# Patient Record
Sex: Female | Born: 1990 | Race: Black or African American | Hispanic: No | Marital: Single | Smoking: Never smoker
Health system: Southern US, Community
[De-identification: ages and names within clinical notes are randomized; demographics above are authoritative.]

## PROBLEM LIST (undated history)

## (undated) DIAGNOSIS — R197 Diarrhea, unspecified: Secondary | ICD-10-CM

## (undated) DIAGNOSIS — Z8719 Personal history of other diseases of the digestive system: Secondary | ICD-10-CM

## (undated) DIAGNOSIS — Z87442 Personal history of urinary calculi: Secondary | ICD-10-CM

## (undated) DIAGNOSIS — T7840XA Allergy, unspecified, initial encounter: Secondary | ICD-10-CM

## (undated) DIAGNOSIS — M810 Age-related osteoporosis without current pathological fracture: Secondary | ICD-10-CM

## (undated) DIAGNOSIS — K5080 Crohn's disease of both small and large intestine without complications: Secondary | ICD-10-CM

## (undated) DIAGNOSIS — K5090 Crohn's disease, unspecified, without complications: Secondary | ICD-10-CM

## (undated) HISTORY — PX: INCISION AND DRAINAGE PERIRECTAL ABSCESS: SHX1804

## (undated) HISTORY — DX: Age-related osteoporosis without current pathological fracture: M81.0

## (undated) HISTORY — DX: Allergy, unspecified, initial encounter: T78.40XA

## (undated) HISTORY — DX: Diarrhea, unspecified: R19.7

## (undated) HISTORY — DX: Crohn's disease, unspecified, without complications: K50.90

---
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
## 2011-01-03 ENCOUNTER — Encounter: Payer: Self-pay | Admitting: Urgent Care

## 2011-01-03 ENCOUNTER — Ambulatory Visit (INDEPENDENT_AMBULATORY_CARE_PROVIDER_SITE_OTHER): Payer: Medicaid Other | Admitting: Urgent Care

## 2011-01-03 DIAGNOSIS — K5090 Crohn's disease, unspecified, without complications: Secondary | ICD-10-CM

## 2011-01-05 NOTE — Progress Notes (Signed)
Summary: problems with prednisone  Phone Note Call from Patient Call back at Home Phone [phone]   Caller: Mom Summary of Call: pts mother called- pt has been on prednisone 22m x 1week. she called her mother today and told her that her legs got weak again today and she fell. pt has 1 more week on 348m pts has not had any diarrhea since being on 3041mplease advise.  Initial call taken by: JulBurnadette PeterN,  February  1, [id number] PM     Appended Document: problems with prednisone decrease to 25 mg by mouth X 1 week, continue taper. if issue continues, we need to think about entocort. unsure if this is an insurance issue or not. have pt give PR on Monday.   Appended Document: problems with prednisone pts mother aware, pt has ov on Monday 01/03/11 with KJ

## 2011-01-11 ENCOUNTER — Encounter: Payer: Self-pay | Admitting: Urgent Care

## 2011-01-12 ENCOUNTER — Encounter: Payer: Self-pay | Admitting: Gastroenterology

## 2011-01-13 NOTE — Assessment & Plan Note (Addendum)
Summary: fu ov in 2 months with KJ or AS per SF/ss   Vital Signs:  Patient profile:   20 year old female Height:      64 inches Weight:      131 pounds BMI:     22.57 Temp:     98.3 degrees F oral Pulse rate:   104 / minute BP sitting:   114 / 80  (left arm) Cuff size:   regular  Vitals Entered By: Burnadette Peter LPN (January 03, 9701 3:18 PM)  Visit Type:  Follow-up Visit Primary Care Provider:  Dr. Lanny Cramp ([facility])  Chief Complaint:  follow up.  History of Present Illness: Here for FU abd pain w/ hx Crohn's disease & recent flare.  Recently started on prednisone & had a fall & weakness in both legs.  Denies fever.  Overall feels well.  Denies vomiting but did have nausea.  Has gained 16# in past 6 wks.  On Prednisone taper 33m daily now.  On Imuran 569mdaily.  OTC MIralax 17 grams daily for constipation.  BM's two times a day, small amts red blood in commode, now resolved.  Good appetite.  Increase urinary freq since on prednisone.  Denies abd pain at this time.  Last labs & CT abd/pelvis w/ IV contrast: mild TI wall thickening.  CBC showed ANC 9100, LFTS normal, hcg normal.  Hx perianal fistula.  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 Once Daily 2)  Imuran 50 Mg Tabs (Azathioprine) ...Marland Kitchen 1 By Mouth Daily 3)  Prednisone 20 Mg Tabs (Prednisone) .... 2 By Mouth Daily For 2 Weeks Then 1 By Mouth Daily For 6 Weeks 4)  Depo-Subq Provera 104 104 Mg/0.6559musp (Medroxyprogesterone Acetate) .... Q 3 Months 5)  Prednisone 10 Mg Tabs (Prednisone) .... 2 By Mouth Daily X 1 Wk, Then 1 1/2 By Mouth Daily X 1 Wk, Then 1 By Mouth Daily X 1 Wk, Then 1/2 By Mouth Daily X 1 Wk, Then Stop 6)  Entocort Ec 3 Mg Xr24h-Cap  (Budesonide) .... 3 By Mouth Daily For Crohn's Disease  Allergies (verified): 1)  ! Morphine  Past History:  Past Medical History: Last updated: 12/21/2010 ANAL FISTUALA Crohns (dec 2011)  Past Surgical History: Last updated: 11/09/2010 I&D  Review of Systems      See HPI General:  Complains of weakness; denies fever, chills, sweats, anorexia, fatigue, malaise, and weight loss. CV:  Complains of peripheral edema; denies chest pains, angina, palpitations, syncope, dyspnea on exertion, orthopnea, PND, and claudication; mild in AM bilat ankles since pred. Resp:  Denies dyspnea at rest, dyspnea with exercise, cough, sputum, wheezing, coughing up blood, and pleurisy. GI:  Denies difficulty swallowing, pain on swallowing, jaundice, black BMs, and fecal incontinence. GU:  Complains of urinary frequency and amenorrhea; denies urinary burning, blood in urine, nocturnal urination, urinary incontinence, and abnormal vaginal bleeding; on Depo LMP irreg. MS:  Complains of joint pain / LOM; denies joint swelling, joint stiffness, joint deformity, low back pain, muscle weakness, muscle cramps, muscle atrophy, leg pain at night, leg pain with exertion, and shoulder pain / LOM hand / wrist pain (CTS). Derm:  Denies rash, itching, dry skin, hives, moles, warts, and unhealing ulcers. Psych:  Denies depression, anxiety, memory loss, suicidal ideation, hallucinations, paranoia, phobia, and confusion. Heme:  Denies bruising and enlarged lymph nodes.  Physical Exam  General:  Well developed, well nourished, no acute distress. Head:  Normocephalic and atraumatic. Eyes:  Sclera clear, no icterus. Mouth:  No deformity or lesions, dentition normal. Neck:  Supple; no masses. Heart:  Regular rate and rhythm; no murmurs, rubs,  or bruits. Abdomen:  Soft, nontender and nondistended. No masses, hepatosplenomegaly or hernias noted. Normal bowel sounds.without guarding and without rebound.   Msk:  Symmetrical  with no gross deformities. Normal posture. Extremities:  No edema or deformities noted. Neurologic:  Alert and  oriented x4;  grossly normal neurologically. Skin:  No pretibial lesions. Psych:  Alert and cooperative. Normal mood and affect.   Impression & Recommendations:  Problem # 1:  ABDOMINAL PAIN (ICD-10.52) 20 year old female with hx perianal fistula and recent Crohn's flare on prednison.  Having lots of side effects from prednisone, perceived weakness, significant wt gain, lower extremity edema.  Denies abd pain or diarrhea at this time.  Discussed pred taper w/ Dr Gala Romney Begin entocort 67m daily Taper Pred 280mdaily x 1 wk 1545m 1 wk 68m28m1wk 5mg 72mwk then stop FU [facility] thipurine metabolites, may need increase in Imuran Discussed Crohn's/Colitis foundation & gave HO  OV in 3 weeks w/me or call if any problems  Other Orders: Est. Patient Level III (9921(87681scriptions: ENTOCORT EC 3 MG XR24H-CAP (BUDESONIDE) 3 by mouth daily for Crohn's disease  #31 x 0   Entered and Authorized by:   KandiAndria MeuseBC   Signed by:   KandiAndria MeuseBC on 01/03/2011   Method used:   [facility]ron[facility] to        [facility]       [address] [id number]  Ph: [phone]  Fax: [phone]:   [id number] 10 MG TABS (PREDNISONE) 2 by mouth daily x 1 wk, then 1 1/2 by mouth daily x 1 wk, then 1 by mouth daily x 1 wk, then 1/2 by mouth daily x 1 wk, then stop  #40 x 0   Entered and Authorized by:   KandiAndria MeuseBC   Signed by:   KandiAndria MeuseBC on 01/03/2011   Method used:   [facility] to        [facility]       [address] [id number]  Ph: [phone]  Fax: [phone]:   [id number] Added: 1)  Est. Patient Level III [9921[82707]ended Document: fu ov in 2  months with KJ or AS per SF/ss 3 WK F/U OPV IS IN THE COMPUTER  Appended Document: fu ov in 2 months with KJ or AS per SF/ss Please call lab re:thEM:[email or web address]? Thanks  Appended Document: fu ov in 2 months with KJ or AS per SF/ss [facility], they have no record of any bloodwork in January, called [facility] and the last bloodwork she had done there was on January 18th. Spoke with pts mom and she stated pt  did go have it done but she will make her do it again since we have no record of it. Explained the reason for the bloodwork and she asked that I send it to the [facility]. lab order faxed to [facility].   Appended Document: fu ov in 2 months with KJ or AS per SF/ss Spoke w/ [facility]-they do not do [facility] labs\ Please FAX order to [facility] for thiopurine metabolites ASAP Thanks  Appended Document: fu ov in 2 months with KJ or AS per SF/ss lab order faxed to lab

## 2011-01-14 ENCOUNTER — Encounter: Payer: Self-pay | Admitting: Urgent Care

## 2011-01-19 NOTE — Medication Information (Signed)
Summary: Visual merchandiser   Imported By: Zeb Comfort 01/14/2011 11:27:28  _____________________________________________________________________  External Attachment:    Type:   Image     Comment:   External Document  Appended Document: RX Folder    Prescriptions: ENTOCORT EC 3 MG XR24H-CAP (BUDESONIDE) 3 by mouth daily for Crohn's disease  #90 x 0   Entered and Authorized by:   Andria Meuse FNP-BC   Signed by:   Andria Meuse FNP-BC on 01/15/2011   Method used:   Electronically to        [facility] [place] (retail)       [address]       [place], [place]  [address]       Ph: [phone]       Fax: [phone]   RxID:   [id number]

## 2011-01-21 ENCOUNTER — Encounter: Payer: Self-pay | Admitting: Gastroenterology

## 2011-01-25 ENCOUNTER — Ambulatory Visit (INDEPENDENT_AMBULATORY_CARE_PROVIDER_SITE_OTHER): Payer: Medicaid Other | Admitting: Gastroenterology

## 2011-01-25 ENCOUNTER — Encounter: Payer: Self-pay | Admitting: Gastroenterology

## 2011-01-25 ENCOUNTER — Ambulatory Visit: Payer: Medicaid Other | Admitting: Urgent Care

## 2011-01-25 DIAGNOSIS — K5090 Crohn's disease, unspecified, without complications: Secondary | ICD-10-CM

## 2011-01-25 LAB — CONVERTED CEMR LAB
AST: 20 units/L (ref 0–37)
Albumin: 3.8 g/dL (ref 3.5–5.2)
CO2: 28 meq/L (ref 19–32)
Eosinophils Relative: 0 % (ref 0–5)
Glucose, Bld: 109 mg/dL — ABNORMAL HIGH (ref 70–99)
Hemoglobin: 13.6 g/dL (ref 12.0–15.0)
Lymphs Abs: 0.8 10*3/uL (ref 0.7–4.0)
MCHC: 33.5 g/dL (ref 30.0–36.0)
Neutrophils Relative %: 87 % — ABNORMAL HIGH (ref 43–77)
Potassium: 3.9 meq/L (ref 3.5–5.3)
RDW: 14.6 % (ref 11.5–15.5)
Sodium: 138 meq/L (ref 135–145)
Total Bilirubin: 0.5 mg/dL (ref 0.3–1.2)
Total Protein: 7.2 g/dL (ref 6.0–8.3)

## 2011-01-25 NOTE — Letter (Signed)
Summary: DISABILITY DETERMINATION  DISABILITY DETERMINATION   Imported By: Hoy Morn 01/21/2011 11:43:16  _____________________________________________________________________  External Attachment:    Type:   Image     Comment:   External Document

## 2011-01-27 ENCOUNTER — Encounter: Payer: Self-pay | Admitting: Internal Medicine

## 2011-02-03 NOTE — Assessment & Plan Note (Signed)
Summary: fu ov in 3 1/2 weeks with KJ   Vital Signs:  Patient profile:   20 year old female Height:      64 inches Weight:      135 pounds BMI:     23.26 Temp:     98.6 degrees F oral Pulse rate:   104 / minute BP sitting:   116 / 72  (left arm)  Vitals Entered By: Loney Loh LPN (January 25, 4286 3:26 PM)  Visit Type:  Follow-up Visit Primary Care Provider:  Dr. Lanny Cramp [facility])   History of Present Illness: Kendra Franklin returns in f/u for recent flare of Crohn's. had been on prednisone but with troublesome side effects; now tapering off and transitioning to entocort. reports significant improvement. denies abdominal pain. 1 normal BM daily. no bleeding. Afebrile. No N/V. finished basketball season. feels great. recently had thiopurine metabolites drawn to assess possibly need to increase imuran. results 170, "lower likelihood of response". reference range of 230-400.  Current Medications (verified): 1)  Miralax  Powd (Polyethylene Glycol 3350) .... Once Daily 2)  Imuran 50 Mg Tabs (Azathioprine) .Marland Kitchen.. 1 By Mouth Daily 3)  Prednisone 20 Mg Tabs (Prednisone) .... 2 By Mouth Daily For 2 Weeks Then 1 By Mouth Daily For 6 Weeks 4)  Depo-Subq Provera 104 104 Mg/0.20m Susp (Medroxyprogesterone Acetate) .... Q 3 Months 5)  Prednisone 10 Mg Tabs (Prednisone) .... 2 By Mouth Daily X 1 Wk, Then 1 1/2 By Mouth Daily X 1 Wk, Then 1 By Mouth Daily X 1 Wk, Then 1/2 By Mouth Daily X 1 Wk, Then Stop 6)  Entocort Ec 3 Mg Xr24h-Cap (Budesonide) .... 3 By Mouth Daily For Crohn's Disease 7)  Budesonide 3 Mg Xr24h-Cap (Budesonide) .... Take 3 Once Daily  Allergies: 1)  ! Morphine  Past History:  Past Medical History: Last updated: 12/21/2010 ANAL FISTUALA Crohns (dec 2011)  Review of Systems General:  Denies fever, chills, and anorexia. Eyes:  Denies blurring, irritation, and discharge. ENT:  Denies sore throat, hoarseness, and difficulty swallowing. CV:  Denies chest pains and  syncope. Resp:  Denies dyspnea at rest and wheezing. GI:  See HPI. GU:  Denies urinary burning and urinary frequency. Kendra:  Denies joint pain / LOM, joint swelling, and joint stiffness. Derm:  Denies rash, itching, and dry skin. Neuro:  Denies weakness and syncope. Psych:  Denies depression and anxiety.  Physical Exam  General:  Well developed, well nourished, no acute distress. Abdomen:  +BS, soft, non-tender, non-distended, no HSM, no rebound or guarding.  Msk:  Symmetrical with no gross deformities. Normal posture. Neurologic:  Alert and  oriented x4;  grossly normal neurologically. Skin:  Intact without significant lesions or rashes. Psych:  Alert and cooperative. Normal mood and affect.   Impression & Recommendations:  Problem # 1:  CROHN'S DISEASE (ICD-554956  20year old female with resolving flare of Crohns. tapering off prednisone, entocort began first week in february. imuran 50 mg daily. thiopurine metabolites slightly below reference range. consider increasing dosage slightly. Will d/w Dr. RGala Romneyprior to medication adjustments.   Return in 4 weeks for reassessment.  needs CBC, CMP at next visit d/w dr. rGala Romneypotentially increasing imuran or maintaining current dose.   Orders: Est. Patient Level II ((68115   Orders Added: 1)  Est. Patient Level II [[72620] Appended Document: fu ov in 3 1/2 weeks with KJ 4 WK F/U OPV IS IN THE COMPUTER

## 2011-02-11 ENCOUNTER — Encounter: Payer: Self-pay | Admitting: Gastroenterology

## 2011-02-21 ENCOUNTER — Ambulatory Visit (INDEPENDENT_AMBULATORY_CARE_PROVIDER_SITE_OTHER): Payer: Medicaid Other | Admitting: Gastroenterology

## 2011-02-21 ENCOUNTER — Encounter: Payer: Self-pay | Admitting: Gastroenterology

## 2011-02-21 VITALS — BP 116/74 | HR 112 | Temp 97.5°F | Ht 64.0 in | Wt 133.0 lb

## 2011-02-21 DIAGNOSIS — K5090 Crohn's disease, unspecified, without complications: Secondary | ICD-10-CM

## 2011-02-22 ENCOUNTER — Encounter: Payer: Self-pay | Admitting: Gastroenterology

## 2011-02-22 LAB — COMPREHENSIVE METABOLIC PANEL
ALT: 10 U/L (ref 0–35)
AST: 18 U/L (ref 0–37)
Calcium: 9.6 mg/dL (ref 8.4–10.5)
Creat: 0.76 mg/dL (ref 0.40–1.20)
Glucose, Bld: 93 mg/dL (ref 70–99)
Potassium: 4 mEq/L (ref 3.5–5.3)

## 2011-02-22 LAB — CBC WITH DIFFERENTIAL/PLATELET
Eosinophils Absolute: 0.1 10*3/uL (ref 0.0–0.7)
Eosinophils Relative: 1 % (ref 0–5)
Hemoglobin: 13.8 g/dL (ref 12.0–15.0)
Lymphocytes Relative: 18 % (ref 12–46)
Lymphs Abs: 1 10*3/uL (ref 0.7–4.0)
MCH: 30.9 pg (ref 26.0–34.0)
Monocytes Relative: 9 % (ref 3–12)
Neutro Abs: 4 10*3/uL (ref 1.7–7.7)
Platelets: 305 10*3/uL (ref 150–400)
WBC: 5.5 10*3/uL (ref 4.0–10.5)

## 2011-02-22 NOTE — Progress Notes (Signed)
  Subjective:    Patient ID: Kendra Franklin, female    DOB: Dec 29, 1990, 20 y.o.   MRN: [id number]  HPI [name] is a pleasant 20 year old female who presents today in f/u for Crohns. She was diagnosed in December and started on Imuran. She had a flare, which was initially managed by prednisone. Due to side effects, pt was placed on prednisone taper and started on entocort 9 mg feb 6th. She returns today doing wonderfully. Her mother is with her, and they are both excited. 1-2 BMs/day. No rectal bleeding. No abdominal pain. Good appetite, no fever/chills.  Imuran was increased to 75 mg daily on 3/8 after review of metabolites showed sub-therapeutic range.    Past Medical History  Diagnosis Date  . Anal fistula   . Crohn's     Past Surgical History  Procedure Date  . Incision and drainage perirectal abscess     Current Outpatient Prescriptions  Medication Sig Dispense Refill  . azaTHIOprine (IMURAN) 50 MG tablet Take 50 mg by mouth daily.        . budesonide (ENTOCORT EC) 3 MG 24 hr capsule Take 6 mg by mouth every morning. TAKE 3 ONCE DAILY       . medroxyPROGESTERone (DEPO-SUBQ PROVERA) 104 MG/0.65ML injection Inject 104 mg into the skin every 3 (three) months.        . Polyethylene Glycol 3350 (MIRALAX PO) Take 17 g by mouth 1 dose over 46 hours.          Allergies  Allergen Reactions  . Morphine     BP 116/74  Pulse 112  Temp 97.5 F (36.4 C)  Ht 5' 4"  (1.626 m)  Wt 133 lb (60.328 kg)  BMI 22.83 kg/m2  LMP 03/28/2010   Review of Systems  Constitutional: Negative.   HENT: Negative.   Eyes: Negative.   Respiratory: Negative.   Cardiovascular: Negative.   Gastrointestinal:       See HPI  Genitourinary: Negative.   Musculoskeletal: Negative.   Neurological: Negative.   Hematological: Negative.   Psychiatric/Behavioral: Negative.        Objective:   Physical Exam  Constitutional: She is oriented to person, place, and time. She appears well-developed and  well-nourished.  HENT:  Head: Normocephalic and atraumatic.  Eyes: Conjunctivae are normal.  Neck: Normal range of motion.  Abdominal: Soft. She exhibits no distension and no mass. There is no tenderness. There is no rebound and no guarding.  Musculoskeletal: Normal range of motion.  Neurological: She is alert and oriented to person, place, and time.  Skin: Skin is warm and dry.  Psychiatric: She has a normal mood and affect. Her behavior is normal.     Assessment & Plan:  20 year old female with hx of newly diagnosed Crohns in December of this past year. Recent increase in Imuran from 50 to 75 mg early March 2012. Has tapered off Prednisone, taking entocort 9 mg since early February. Doing excellent at this time.    1. Continue Imuran 75 mg daily. Will need updated thiopurine metabolites in next few weeks. 2. CBC, CMP today 3. Decrease entocort to 6 mg daily starting April 1st.  4. F/U with [name] in 6 weeks. May be able to taper to 3 mg at that time.

## 2011-02-23 ENCOUNTER — Telehealth: Payer: Self-pay | Admitting: Gastroenterology

## 2011-02-23 NOTE — Progress Notes (Signed)
Reviewed by R. Michael Yailin Biederman, MD FACP FACG 

## 2011-02-23 NOTE — Telephone Encounter (Signed)
Continue entocort 9 mg until April 1st. Starting April 1st, may taper to 6 mg daily. Continue Entocort 6 mg until next office visit.

## 2011-02-23 NOTE — Progress Notes (Signed)
Quick Note:  Normal. Please inform pt. ______

## 2011-02-23 NOTE — Telephone Encounter (Signed)
Informed pt's mother of the medication change for 02/27/2011.

## 2011-03-02 ENCOUNTER — Ambulatory Visit: Payer: Medicaid Other | Admitting: Gastroenterology

## 2011-03-02 ENCOUNTER — Telehealth: Payer: Self-pay

## 2011-03-02 NOTE — Telephone Encounter (Signed)
Pt's Mom called back and asked what could she have for a headache. I looked and saw [name] wanted her seen today. Offered her 11:30 appt with Vickey Huger, NP. She was in [place] and said she could not be here til later about 11:40. Per Leta Baptist has agreed to see pt at 3:00 pm. Pt's mom aware and said she is not sure she will come back for appt if pt starts feeling better. She will call and let us know.

## 2011-03-02 NOTE — Telephone Encounter (Signed)
Pt's mom called and said pt woke up early this AM with N/V and abd pain. No diarrhea. Mom is going to [facility] to get her and wants to know if she should be seen or please advise!

## 2011-03-02 NOTE — Telephone Encounter (Signed)
Per Crystal, pt's mom called and said to cancel Kendra Franklin's appt at three. Gave no reason.

## 2011-03-02 NOTE — Telephone Encounter (Signed)
Have pt seen in an urgent spot today. If no slot available, page [name]. Pt may need medication adjustment.

## 2011-03-07 ENCOUNTER — Ambulatory Visit: Payer: Medicaid Other | Admitting: Urgent Care

## 2011-03-09 LAB — POCT HEMOGLOBIN-HEMACUE: Hemoglobin: 13.2 g/dL (ref 12.0–16.0)

## 2011-03-10 LAB — POCT HEMOGLOBIN-HEMACUE: Hemoglobin: 14.2 g/dL (ref 12.0–16.0)

## 2011-03-14 ENCOUNTER — Other Ambulatory Visit: Payer: Self-pay

## 2011-03-14 NOTE — Telephone Encounter (Signed)
Pt was asking about refills and i told her the pharmacy is suppose to fax over the request for the medications.

## 2011-03-22 ENCOUNTER — Telehealth: Payer: Self-pay | Admitting: Gastroenterology

## 2011-03-22 ENCOUNTER — Telehealth: Payer: Self-pay

## 2011-03-22 MED ORDER — BUDESONIDE 3 MG PO CP24: 6.0000 mg | ORAL_CAPSULE | Freq: Every day | ORAL | Status: DC

## 2011-03-22 NOTE — Telephone Encounter (Signed)
Pt's Mom called and said pt is completely out of the Entocort. She had requested last week and has not had any since Fri. We have not received a request, so i called [facility] and spoke to Chad who said they faxed request on 14th and 16th for refill. She is faxing over another request. Pt's mom is asking for extra refills so pt doesn't have to go through this monthly. Pt does have appt with [name] on 04/06/2011 @ 3:30 pm.

## 2011-03-22 NOTE — Telephone Encounter (Signed)
Pt needs updated thiopurine metabolites from [facility]. She had an increase in her imuran in March. I want to make sure this dose is appropriate for her. Let's also get a CBC and HFP. I believe she should be coming in for an appt in the near future. This way, these labs will be available for review at that time.

## 2011-03-22 NOTE — Telephone Encounter (Signed)
We have not received request from [facility] for Entocort via fax, or electronically in [place] or [place]. Please check with [facility] to find out what fax number they are using.   Will send Entocort electronically.  Patient needs to keep appt with [name].

## 2011-03-22 NOTE — Telephone Encounter (Signed)
Pt's mom informed that Rx was sent. Reminded of appt and to make sure pt keeps appt.

## 2011-03-23 ENCOUNTER — Other Ambulatory Visit: Payer: Self-pay | Admitting: Gastroenterology

## 2011-03-23 ENCOUNTER — Other Ambulatory Visit: Payer: Self-pay

## 2011-03-23 DIAGNOSIS — K5090 Crohn's disease, unspecified, without complications: Secondary | ICD-10-CM

## 2011-03-23 NOTE — Telephone Encounter (Signed)
Informed pt's Mom about the labs. Order will be faxed to [facility] and she was reminded to keep the upcoming appt with [name].

## 2011-03-26 LAB — CBC WITH DIFFERENTIAL/PLATELET
HCT: 38.3 % (ref 36.0–46.0)
Hemoglobin: 12.8 g/dL (ref 12.0–15.0)
Lymphs Abs: 1.2 10*3/uL (ref 0.7–4.0)
MCV: 90.5 fL (ref 78.0–100.0)
Monocytes Absolute: 0.5 10*3/uL (ref 0.1–1.0)
Neutro Abs: 3.8 10*3/uL (ref 1.7–7.7)
Platelets: 303 10*3/uL (ref 150–400)
WBC: 5.6 10*3/uL (ref 4.0–10.5)

## 2011-03-26 LAB — HEPATIC FUNCTION PANEL
ALT: 9 U/L (ref 0–35)
AST: 17 U/L (ref 0–37)
Albumin: 4.4 g/dL (ref 3.5–5.2)
Indirect Bilirubin: 0.4 mg/dL (ref 0.0–0.9)
Total Protein: 7.4 g/dL (ref 6.0–8.3)

## 2011-04-04 ENCOUNTER — Encounter: Payer: Self-pay | Admitting: Gastroenterology

## 2011-04-04 NOTE — Progress Notes (Unsigned)
Labs ordered 5/1: Received updated thiopurine metabolites (pt's dosage of imuran increased to 75 mg in march).  Within reference range for higher likelihood of response (233.. Values: 230-400).  Risk of hepatotoxicity (1173. Values: <5700)  To be scanned into epic. Appt with [name] May 9th.

## 2011-04-06 ENCOUNTER — Encounter: Payer: Self-pay | Admitting: Gastroenterology

## 2011-04-06 ENCOUNTER — Ambulatory Visit: Payer: Medicaid Other | Admitting: Gastroenterology

## 2011-04-06 VITALS — BP 119/71 | HR 102 | Temp 97.6°F | Ht 64.0 in | Wt 128.4 lb

## 2011-04-06 DIAGNOSIS — K5090 Crohn's disease, unspecified, without complications: Secondary | ICD-10-CM

## 2011-04-06 MED ORDER — AZATHIOPRINE 50 MG PO TABS: ORAL_TABLET | ORAL | Status: DC

## 2011-04-06 MED ORDER — BUDESONIDE 3 MG PO CP24: 6.0000 mg | ORAL_CAPSULE | Freq: Every day | ORAL | Status: DC

## 2011-04-06 NOTE — Progress Notes (Signed)
Please call pt. If Sx controlled & pt has been on dose at least 3 mos, then leave dose at 75 mg daily. If Sx not controlled, increase dose to 100 mg/d (1.6 mg/kg).

## 2011-04-06 NOTE — Patient Instructions (Addendum)
Take Imuran 100 mg a day. Continue Entocort 2 daily until next visit AUG 2012. Follow up AUG 2012. Will need labs drawn in AUG 2012.

## 2011-04-06 NOTE — Progress Notes (Signed)
  Subjective:    Patient ID: Kendra Franklin, female    DOB: Mar 31, 1991, 20 y.o.   MRN: [id number]  PCP: DR. George Ina  HPI  BMs: 1/DAY, NO BLOOD. Pain in belly-once month, lasts a couple of hours. LAYS DOWN AND it goes away after ~4 hours. Feels it in her whole entire stomach. No nausea or vomiting. No nocturnal BMs. Not awakened with pain in the middle of the night. Rectal pain only once since [place] 2011. SR at [employer] and after graduation going to [employer]. Doesn't feel like she's ready for a four year college. Mild abd pain: < 1/week.  Past Medical History  Diagnosis Date  . Anal fistula   . BMI between 19-24,adult 2011 116 LBS  . Crohn's JULY 2008    APR 2012 6-TGN 233(LLN 230)  6-MMPN 1173 ON 75 MG IMURAN   Past Surgical History  Procedure Date  . Incision and drainage perirectal abscess   . Givens capsule study DEC 2011    [facility]  . Colonoscopy DEC 2011    [facility]   Allergies  Allergen Reactions  . Morphine    Current Outpatient Prescriptions  Medication Sig Dispense Refill  . azaTHIOprine (IMURAN) 50 MG tablet 1.5 po daily    . budesonide (ENTOCORT EC) 3 MG 24 hr capsule Take 2 capsules (6 mg total) by mouth daily.    . medroxyPROGESTERone (DEPO-SUBQ PROVERA) 104 MG/0.65ML injection Inject 104 mg into the skin every 3 (three) months.        . Polyethylene Glycol 3350 (MIRALAX PO) Take 17 g by mouth 1 dose over 46 hours.         Family History  Problem Relation Age of Onset  . Colon cancer Neg Hx   . Colon polyps Neg Hx   . Inflammatory bowel disease Neg Hx     Review of Systems  All other systems reviewed and are negative.       Objective:   Physical Exam  Constitutional: She is oriented to person, place, and time. She appears well-developed and well-nourished. No distress.  HENT:  Head: Normocephalic and atraumatic.  Mouth/Throat: Oropharynx is clear and moist.       NO LESIONS  Eyes: Pupils are equal, round, and  reactive to light. No scleral icterus.  Neck: Normal range of motion.  Cardiovascular: Normal rate and regular rhythm.   Pulmonary/Chest: Effort normal and breath sounds normal.  Abdominal: Soft. Bowel sounds are normal. There is no tenderness.       MILD TTP IN LUQ, & BLQ  Musculoskeletal: Normal range of motion. She exhibits no edema.  Lymphadenopathy:    She has no cervical adenopathy.  Neurological: She is alert and oriented to person, place, and time.  Skin: Skin is warm and dry. No rash noted.          Assessment & Plan:

## 2011-04-07 ENCOUNTER — Encounter: Payer: Self-pay | Admitting: Gastroenterology

## 2011-04-07 NOTE — Progress Notes (Signed)
See OV. Already addressed by [name].

## 2011-04-07 NOTE — Assessment & Plan Note (Addendum)
Sx improved "80%" not 100% resolved. Imuran metabolite: 6-TGN 233(LLN 230).  Increase Imuran to 100 mg daily (1.7 MG/KG).  Continue Entocort 2 daily until next visit. OPV Jul 13, 2011. Will need CBC/DIFF, HFP.  Spoke with Ms. Grandville Silos 04/07/11 1308-answered questions and concerns.

## 2011-04-12 NOTE — Op Note (Signed)
[name], [name] NO.:  [id number]   MEDICAL RECORD NO.:  [id number]          PATIENT TYPE:  AMB   LOCATION:  [place]                          FACILITY:  [facility]   PHYSICIAN:  Burt Knack, MD   DATE OF BIRTH:  [birth year]   DATE OF PROCEDURE:  02/16/2009  DATE OF DISCHARGE:                               OPERATIVE REPORT   PREOPERATIVE DIAGNOSIS:  Persistent rectal sinus tract.   POSTOPERATIVE DIAGNOSIS:  Persistent rectal sinus tract.   OPERATION PERFORMED:  Change of wound dressing.   ATTENDING SURGEON:  Burt Knack, MD   DESCRIPTION OF PROCEDURE:  After identifying the patient, she was placed  in the supine position upon the operating room table.  When adequate  level of anesthesia had been safely obtained, the patient was placed in  lithotomy.  The wound was examined.  The packing was removed.  The depth  of wound appeared to be approximately 4-5 cm.  The packing was exchanged  and Xylocaine cream was applied to the perianal area.  The patient  tolerated the procedure well.  She was awakened in the operating room  and returned to the recovery room in a stable condition.      Burt Knack, MD  Electronically Signed     TMW/MEDQ  D:  02/16/2009  T:  02/17/2009  Job:  [id number]

## 2011-04-12 NOTE — Op Note (Signed)
Kendra Franklin, Kendra Franklin NO.:  [id number]   MEDICAL RECORD NO.:  [id number]          PATIENT TYPE:  AMB   LOCATION:  SDS                          FACILITY:  [facility]   PHYSICIAN:  Oletha Blend, M.D.  DATE OF BIRTH:  January 18, 1991   DATE OF PROCEDURE:  09/05/2008  DATE OF DISCHARGE:  09/05/2008                               OPERATIVE REPORT   PREOPERATIVE DIAGNOSIS:  Unexplained perianal disease, rule out Crohn  disease.   POSTOPERATIVE DIAGNOSIS:  Unexplained perianal disease, rule out Crohn  disease.   NAME OF OPERATION:  Colonoscopy with biopsy.   SURGEON:  Oletha Blend, MD   ASSISTANT:  None.   DESCRIPTION OF FINDINGS:  Following informed written consent, the  patient was taken to the operating room and placed under general  anesthesia with continuous cardiopulmonary monitoring.  She remained in  the supine position, and the Pentax colonoscope was inserted per rectum  and advanced without difficulty 140 cm to the vicinity of the cecum.  Mild upward lesions were seen in the rectosigmoid, but the remainder of  the colon was normal.  No erythema, edema, or ulceration was seen.  Multiple biopsies were obtained in the ascending colon, hepatic flexure,  descending colon, and rectum.  The colonoscope was gradually withdrawn,  and the patient was awakened and taken to recovery room in satisfactory  condition.  All of her colon biopsies were subsequently found to be  unremarkable.  She will return for an upper GI with small bowel series  in approximately 3 days to continue to evaluate for possible Crohn  disease as a cause for her perianal fissure.   DESCRIPTION OF TECHNICAL PROCEDURES USED:  Pentax colonoscope with cold  biopsy forceps.   DESCRIPTION OF SPECIMENS REMOVED:  Ascending colon x3 in formalin,  hepatic colon x3 in formalin, descending colon x3 in formalin, and  rectum x3 in formalin.           ______________________________  Oletha Blend,  M.D.     JHC/MEDQ  D:  09/17/2008  T:  09/18/2008  Job:  [id number]   cc:   Wayna Chalet, MD

## 2011-04-12 NOTE — Op Note (Signed)
[name], Kendra NO.:  [id number]   MEDICAL RECORD NO.:  [id number]          PATIENT TYPE:  AMB   LOCATION:  [place]                          FACILITY:  [facility]   PHYSICIAN:  Burt Knack, MD   DATE OF BIRTH:  [birth year]   DATE OF PROCEDURE:  11/03/2008  DATE OF DISCHARGE:                               OPERATIVE REPORT   PREOPERATIVE DIAGNOSES:  1. ? Crohn disease.  2. Perianal fistula.   POSTOPERATIVE DIAGNOSES:  1. ? Crohn disease.  2. Perianal fistula.   OPERATIONS PERFORMED:  1. Exam under anesthesia of rectum.  2. Biopsy of perianal lesion.   SURGEON:  Kris Hartmann. Para March, MD   ASSISTANT SURGEON:  Adin Hector, MD   ANESTHESIA:  General.   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  She has undergone  evaluation by the pediatric gastroenterologist and there is concern for  Crohn disease.  The tract has not healed and continues to drain.   FINDINGS:  1. A 3- to 4-inch sinus extending from approximately 7 o'clock in the      dorsal lithotomy position alongside of the rectum and pointing      towards the vaginal canal.  2. No evidence of fistula crossing over into either rectum or vagina.  3. Induration in the right ischiorectal area.  4. Poorly-healing granulation tissue and tract.   DESCRIPTION OF PROCEDURE:  After identifying the patient, she was placed  in supine position upon the operating room table.  When adequate level  of anesthesia had been safely obtained, the patient was placed in  lithotomy.  A digital rectal exam was performed and noticed the  thickening of tissue along the right side of her ischiorectal fossa.  The tract was examined.  This was gently probed with a metal probe and  extended approximately 3-4 cm alongside of the rectum and then towards  the vaginal wall.  There was no evidence of a fistulous communication,  however.  A small  amount of purulence was removed.  Biopsies with  granulation tissue were taken.  Attempts to gain deeper tissue was limited.  The wound was irrigated and  then packed with Iodoform gauze.  Dressings were applied.  The patient  was awakened in the operating room and returned to the recovery room in  a stable condition.      Burt Knack, MD  Electronically Signed     TMW/MEDQ  D:  11/04/2008  T:  11/04/2008  Job:  [id number]

## 2011-04-12 NOTE — Op Note (Signed)
Kendra Franklin, COZZOLINO NO.:  [id number]   MEDICAL RECORD NO.:  [id number]          PATIENT TYPE:  AMB   LOCATION:  [place]                          FACILITY:  [facility]   PHYSICIAN:  Burt Knack, MD   DATE OF BIRTH:  23-Apr-1991   DATE OF PROCEDURE:  08/18/2008  DATE OF DISCHARGE:                               OPERATIVE REPORT   PREOPERATIVE DIAGNOSIS:  Perianal lesion.   POSTOPERATIVE DIAGNOSIS:  Perianal lesion.   OPERATION PERFORMED:  Exam under anesthesia of rectum.   ANESTHESIA:  Mask anesthesia.   FINDINGS:  1. Normally positioned anal opening.  2. Slowly healing incisions at the 12 o'clock position.  3. No evidence of undrained fluid collection.  4. No evidence of ulceration to suggest inflammatory bowel disease.  5. No evidence of fistula.   DESCRIPTION OF PROCEDURE:  After identifying the patient, she was laid  in supine position upon the operating room table.  When adequate level  of anesthesia had been safely obtained, she was rolled to a lateral  decubitus position and the rectum was examined.  A digital rectal exam  was performed and did not demonstrate any mass, lesion, or fluctuance.  The lesion itself was at the 12 o'clock position and demonstrated  granulation tissue within it.  This was friable, but there was no  evidence of ulceration.  There was no evidence of communication.  This  was adjacent to the external anal sphincter and so, no further  procedures were done.  My impression was that this is a slowly, but  steadily healing wound.  There was no other gross pathology.  A speculum  exam was done of the anal canal and this did not demonstrate any  evidence of other ulcers, lesions, or stigmata of inflammatory bowel  disease.   The patient was awakened in the operating room and returned to the  recovery room in stable condition.      Burt Knack, MD  Electronically Signed     TMW/MEDQ  D:  08/19/2008  T:  08/19/2008  Job:   [id number]

## 2011-04-12 NOTE — Op Note (Signed)
Kendra Franklin, HENCH NO.:  [id number]   MEDICAL RECORD NO.:  [id number]          PATIENT TYPE:  AMB   LOCATION:  [place]                          FACILITY:  [facility]   PHYSICIAN:  Burt Knack, MD   DATE OF BIRTH:  July 03, 1991   DATE OF PROCEDURE:  03/16/2009  DATE OF DISCHARGE:                               OPERATIVE REPORT   PREOPERATIVE DIAGNOSIS:  Chronic perianal fistula.   POSTOPERATIVE DIAGNOSIS:  Chronic perianal fistula.   OPERATION:  Exam under anesthesia of the rectum.   SURGEON:  Burt Knack, MD   DESCRIPTION OF PROCEDURE:  After identifying the patient, she was placed  in the supine position upon the operating room table.  When adequate  level of anesthesia had been safely obtained, the patient was placed in  lithotomy.  Examination of the perineum was done by visual inspection.  There was of normal vaginal introitus without significant evidence of  problems.  There was no evidence of purulent drainage from the vaginal  introitus.   The normally placed anus was without gross pathology also.  The  fistulous tract at the 7 o'clock position in lithotomy persisted.  Digital rectal exam did not demonstrate any evidence of a significant  abscess along the tract.  The induration could be appreciated, however,  3-4 cm along the rectal wall.  Probing of the sinus tract demonstrated  chronic granuloma tissue and a small amount of creamy fluid.  Again,  this did not appear to communicate with the rectum.  The sinus tract was  approximately 3-4 cm in length.  This was gently debrided and Nu-Gauge  packing was placed within the tract.   Overall, there had been no significant improvement or resolution in the  extent of the sinus tract.  There appeared to be no progression either.   The patient was awakened in the operating room and returned to the  recovery room in stable condition.      Burt Knack, MD  Electronically Signed     TMW/MEDQ   D:  03/16/2009  T:  03/17/2009  Job:  [id number]

## 2011-04-13 ENCOUNTER — Telehealth: Payer: Self-pay

## 2011-04-13 NOTE — Telephone Encounter (Signed)
Forwarding to [employer]

## 2011-04-13 NOTE — Telephone Encounter (Signed)
Pt's mom was informed that Laban Emperor, NP, will discuss with [name], who is away this afternoon in class. Mom said she saw Dr. Lanny Cramp in [place]. I called and spoke with nurse Maudie Mercury who said that she was seen there this AM. No tests were done. Pt was told to follow-up with her GI for Crohn's flare.

## 2011-04-13 NOTE — Telephone Encounter (Signed)
Which GYN did she see?

## 2011-04-13 NOTE — Telephone Encounter (Signed)
pts mom called- pt went to MD because she thought she had a yeast infection. When he looked into her vagina he told her that it was purple and it looked like crohns in her vagina. Pt is having pain and itching in the vaginal area and painful urination. They want to know what they should do. Please advise.

## 2011-04-13 NOTE — Telephone Encounter (Signed)
Will need to discuss in detail with [name]. In interim, let's get the notes from GYN, any pathology available. 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 Will ultimately need further evaluation and office appt with [place] so we can set up appropriate plan/tests.

## 2011-04-15 ENCOUNTER — Ambulatory Visit: Payer: Medicaid Other | Admitting: Urgent Care

## 2011-04-15 NOTE — Telephone Encounter (Signed)
Pt's mom called and said pt could not miss school today for the [place]. I spoke with [name], she said to tell the mom that she will call her today, pt does not need OV appt. Informed Mom and Serena Colonel.

## 2011-04-18 NOTE — Telephone Encounter (Signed)
Called mother Friday, May 18 at [facility]. Pt in the middle of going to the prom.  Spoke with her mother May 21. Dr. Lanny Cramp thinks her Crohn's may involve her vagina. Pt feels brown d/c from vagina-2o to DEPO SHOT. Getting her DEPO today. No air from vagina, just itching and burning. Thought she had yeast infection. Dr. Lanny Cramp looked in vagina & said her vaginal wall was purple and swollen. Rectum is doing good. When she has a BM, she can have a slimy d/c from her fistula.    Pt clinically does not have a FISTULA. Needs to see her GYN MD for a speculum exam. Called Dr. Ritta Slot ofc [phone]. Pt usually sees Dr. Adah Perl. Pt given appt today at 1515. Explained to mom that we recently increased her Imuran dose and unless she has stool from her vagina or passing air from her vagina then we will not institute more agressive therapy. If the GYN has questions, she can have them page me.

## 2011-04-19 ENCOUNTER — Telehealth: Payer: Self-pay

## 2011-04-19 NOTE — Telephone Encounter (Signed)
Pt called and said that she saw the OB/GYN doctor yesterday. They did a swab and said the results would be back tomorrow. She said that she did have gas pass from her vagina last night and she was told to report it when it happened. Please advise!

## 2011-04-19 NOTE — Telephone Encounter (Signed)
Please call pt. Passing gas one time from her vagina is not unusual. As long as she does not continuous gas from her vagina she should not worry. Will await GYN results.

## 2011-04-19 NOTE — Telephone Encounter (Signed)
LM with Mom for pt to return call. Pt returned call at once. Said she received call from GYN and they said she did not have a yeast infection nor a bacterial infection. She was informed of [name] info on the vagina gas.

## 2011-06-21 ENCOUNTER — Ambulatory Visit (HOSPITAL_COMMUNITY)
Admission: RE | Admit: 2011-06-21 | Discharge: 2011-06-21 | Disposition: A | Discharge status: Home / Self Care | Payer: Medicaid Other | Source: Ambulatory Visit | Attending: Urology | Admitting: Urology

## 2011-06-21 ENCOUNTER — Other Ambulatory Visit (HOSPITAL_COMMUNITY): Payer: Self-pay | Admitting: Urology

## 2011-06-21 DIAGNOSIS — R109 Unspecified abdominal pain: Secondary | ICD-10-CM

## 2011-06-21 DIAGNOSIS — R9389 Abnormal findings on diagnostic imaging of other specified body structures: Secondary | ICD-10-CM | POA: Insufficient documentation

## 2011-06-21 DIAGNOSIS — R1031 Right lower quadrant pain: Secondary | ICD-10-CM | POA: Insufficient documentation

## 2011-06-23 ENCOUNTER — Ambulatory Visit (INDEPENDENT_AMBULATORY_CARE_PROVIDER_SITE_OTHER): Payer: Medicaid Other | Admitting: Gastroenterology

## 2011-06-23 ENCOUNTER — Encounter: Payer: Self-pay | Admitting: Gastroenterology

## 2011-06-23 VITALS — BP 120/74 | HR 103 | Temp 98.2°F | Ht 64.0 in | Wt 126.2 lb

## 2011-06-23 DIAGNOSIS — K5090 Crohn's disease, unspecified, without complications: Secondary | ICD-10-CM

## 2011-06-23 NOTE — Progress Notes (Signed)
  Subjective:    Patient ID: Kendra Franklin, female    DOB: September 20, 1991, 20 y.o.   MRN: [id number]  HPI Kidney stone SAT and passed it on TUE. Drainage around rectum and tender between her buttocks and it started last night. Imuran 1.5 tablets. BM:2/nl, no blood. No abd pain since passing stone. No nausea or vomiting, fever, or chills. Having to wipe a whole lot because of drainage most of the time, but all of sudden it got real bad. Taking Entocort for the past 3 mos and mom feels she has less flare ups.Taking Depo for birth control.   Past Medical History  Diagnosis Date  . Anal fistula   . BMI between 19-24,adult 2011 116 LBS  . Crohn's JULY 2008    DEC 2011 ASCA 100 pANCA NEG-APR 2012 6-TGN 233(LLN 230)  6-MMPN 1173 ON 75 MG IMURAN   Past Surgical History  Procedure Date  . Incision and drainage perirectal abscess   . Givens capsule study DEC 2011    SB ULCERS  . Colonoscopy DEC 2011    ILEO-COLONIC ULCERS   Allergies  Allergen Reactions  . Morphine    Current Outpatient Prescriptions  Medication Sig Dispense Refill  . azaTHIOprine (IMURAN) 50 MG tablet 2 po daily  60 tablet  11  . budesonide (ENTOCORT EC) 3 MG 24 hr capsule Take 2 capsules (6 mg total) by mouth daily.  60 capsule  4  . medroxyPROGESTERone (DEPO-SUBQ PROVERA) 104 MG/0.65ML injection Inject 104 mg into the skin every 3 (three) months.        . Polyethylene Glycol 3350 (MIRALAX PO) Take 17 g by mouth 1 dose over 46 hours.            Review of Systems  All other systems reviewed and are negative.       Objective:   Physical Exam  Vitals reviewed. Constitutional: She is oriented to person, place, and time. She appears well-developed and well-nourished. No distress.  HENT:  Head: Normocephalic and atraumatic.  Mouth/Throat: Oropharynx is clear and moist. No oropharyngeal exudate.  Eyes: Pupils are equal, round, and reactive to light. No scleral icterus.  Neck: Normal range of motion. Neck supple.    Cardiovascular: Normal rate, regular rhythm and normal heart sounds.   Pulmonary/Chest: Effort normal and breath sounds normal.  Abdominal: Soft. Bowel sounds are normal. She exhibits no distension. There is no tenderness.  Musculoskeletal: She exhibits no edema.  Neurological: She is alert and oriented to person, place, and time.  Skin: No rash noted.  Psychiatric: She has a normal mood and affect.  RECTAL: NO PALPABLE MASSES. MILD TTP IN THE MIDLINE/POSTERIOR ANUS. NO BRB. NT EXAM ON THE PERI-ANAL REGION.        Assessment & Plan:

## 2011-06-24 NOTE — Assessment & Plan Note (Addendum)
Sx not ideally controlled after 6 mos Imuran and 3 mos of Entocort.   Get  PPD. DISCUSSED WITH DR. Cameron Proud MEDICINE WOULD NOT ACCOMMODATE THE PT. 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 Med warning discussed. Continue Imuran and will OBTAIN metabolites. Stop Entocort once Remicade begins. OPV in 3-4 mos.

## 2011-06-27 ENCOUNTER — Telehealth: Payer: Self-pay

## 2011-06-27 ENCOUNTER — Encounter: Payer: Self-pay | Admitting: Gastroenterology

## 2011-06-27 NOTE — Telephone Encounter (Signed)
Pt's Mom called and said the patients TB test was negative and she wanted to know when she could start Remicade so they could plan their vacation. I asked her to bring the documentation by for her chart and I would let [name] know. She said she will bring it by today or tomorrow.

## 2011-06-27 NOTE — Telephone Encounter (Signed)
Please call pt's mother. She needs to have Imuran (PROMETHEUS THIOPURINE) metabolites drawn. Her Remicade should be started within the next 7-14 days. We will need the documentation of the neg PPD and the Imuran metabolite results. Please have her blood drawn as soon as possible.

## 2011-06-27 NOTE — Progress Notes (Signed)
Cc to PCP 

## 2011-06-27 NOTE — Telephone Encounter (Signed)
Pt came by and brought the documentation of the negative PPD. I gave her the lab order and asked her to get blood drawn as soon as possible. She is aware the Remicade should be scheduled within 7-14 days.

## 2011-06-27 NOTE — Progress Notes (Signed)
Pt aware of OV with SF on 8/16 @ 245

## 2011-07-07 ENCOUNTER — Telehealth: Payer: Self-pay | Admitting: Gastroenterology

## 2011-07-07 NOTE — Telephone Encounter (Signed)
Referral faxed to [facility].

## 2011-07-07 NOTE — Telephone Encounter (Signed)
CALLED PT-NO VM. Imuran levels therapeutic: AUG 2012 6-TGN 290 (230-400), 6-MMPN 3617 (< 5700). Needs Remicade for peri-anal fistulizing disease that has not responded to Imuran. ORDER FAXED TO BEGIN INFUSION TO [facility] [phone]. REMICADE 5 MG/KG-300 MG IV INFUSION AT 0,2,6 WEEKS AND THEN Q8WEEKS. OPV DEC 2012 W/ SLF ONLY, Dx: Crohn's Disease.

## 2011-07-07 NOTE — Telephone Encounter (Signed)
Spoke with mom. EXPLAINED REMICADE INFUSION SCHEDULE. Pt going to school near [facility]. Will likely need infusions close to school. REFER TO [facility] GI DEPT, dx: Crohn's ileitis with peri-anal fistula. FAX RECORDS. Pt needs appt within the next 2 weeks if possible. Mom will pick up Remicade order.

## 2011-07-08 MED ORDER — INFLIXIMAB 100 MG IV SOLR: 300.0000 mg | INTRAVENOUS | Status: DC

## 2011-07-08 NOTE — Telephone Encounter (Signed)
Addended by: Danie Binder on: 07/08/2011 08:48 AM   Modules accepted: Orders

## 2011-07-11 ENCOUNTER — Encounter (HOSPITAL_COMMUNITY): Payer: Medicaid Other | Attending: Pediatrics

## 2011-07-11 DIAGNOSIS — K5090 Crohn's disease, unspecified, without complications: Secondary | ICD-10-CM | POA: Insufficient documentation

## 2011-07-11 MED ORDER — SODIUM CHLORIDE 0.9 % IJ SOLN
10.0000 mL | INTRAMUSCULAR | Status: DC | PRN
  Administered 2011-07-11: 10 mL via INTRAVENOUS

## 2011-07-11 MED ORDER — SODIUM CHLORIDE 0.9 % IV SOLN
300.0000 mg | Freq: Once | INTRAVENOUS | Status: AC
  Administered 2011-07-11: 300 mg via INTRAVENOUS
  Filled 2011-07-11: qty 30

## 2011-07-11 MED ORDER — SODIUM CHLORIDE 0.9 % IV SOLN
INTRAVENOUS | Status: DC
  Administered 2011-07-11: 13:00:00 via INTRAVENOUS

## 2011-07-11 MED ORDER — SODIUM CHLORIDE 0.9 % IJ SOLN
INTRAMUSCULAR | Status: AC
  Administered 2011-07-11: 10 mL via INTRAVENOUS
  Filled 2011-07-11: qty 10

## 2011-07-12 ENCOUNTER — Encounter: Payer: Self-pay | Admitting: Gastroenterology

## 2011-07-14 ENCOUNTER — Ambulatory Visit: Payer: Medicaid Other | Admitting: Gastroenterology

## 2011-07-18 NOTE — Telephone Encounter (Signed)
Reminder in epic to follow up with SF only in Dec 2012

## 2011-07-25 ENCOUNTER — Ambulatory Visit (HOSPITAL_COMMUNITY): Payer: Medicaid Other

## 2011-08-22 ENCOUNTER — Ambulatory Visit (HOSPITAL_COMMUNITY): Payer: Medicaid Other

## 2011-08-29 LAB — POCT HEMOGLOBIN-HEMACUE: Hemoglobin: 12.2

## 2011-08-31 ENCOUNTER — Encounter: Payer: Self-pay | Admitting: Gastroenterology

## 2011-09-02 LAB — URINE MICROSCOPIC-ADD ON

## 2011-09-02 LAB — URINALYSIS, ROUTINE W REFLEX MICROSCOPIC
Nitrite: POSITIVE — AB
Specific Gravity, Urine: >1.03 — ABNORMAL HIGH (ref 1.005–1.030)
Urobilinogen, UA: 0.2 mg/dL (ref 0.0–1.0)

## 2011-09-02 LAB — CBC
Hemoglobin: 11.3 g/dL — ABNORMAL LOW (ref 12.0–16.0)
MCHC: 33.3 g/dL (ref 31.0–37.0)
Platelets: 265 10*3/uL (ref 150–400)
RDW: 13.8 % (ref 11.4–15.5)

## 2011-09-02 LAB — DIFFERENTIAL
Lymphs Abs: 0.7 10*3/uL — ABNORMAL LOW (ref 1.1–4.8)
Monocytes Absolute: 0.7 10*3/uL (ref 0.2–1.2)
Monocytes Relative: 8 % (ref 3–11)
Neutro Abs: 7.4 10*3/uL (ref 1.7–8.0)
Neutrophils Relative %: 83 % — ABNORMAL HIGH (ref 43–71)

## 2011-09-02 LAB — COMPREHENSIVE METABOLIC PANEL
ALT: 15 U/L (ref 0–35)
Albumin: 3.4 g/dL — ABNORMAL LOW (ref 3.5–5.2)
BUN: 12 mg/dL (ref 6–23)
Calcium: 8.9 mg/dL (ref 8.4–10.5)
Glucose, Bld: 110 mg/dL — ABNORMAL HIGH (ref 70–99)
Sodium: 141 mEq/L (ref 135–145)
Total Protein: 6.5 g/dL (ref 6.0–8.3)

## 2011-09-02 LAB — PREGNANCY, URINE: Preg Test, Ur: NEGATIVE

## 2011-09-02 LAB — LIPASE, BLOOD: Lipase: 23 U/L (ref 11–59)

## 2011-09-12 ENCOUNTER — Ambulatory Visit: Payer: Medicaid Other | Admitting: Urgent Care

## 2011-09-12 ENCOUNTER — Telehealth: Payer: Self-pay | Admitting: Urgent Care

## 2011-09-12 NOTE — Telephone Encounter (Signed)
Pt has OV 10/2011 w/ SLF.

## 2011-09-13 ENCOUNTER — Other Ambulatory Visit: Payer: Self-pay | Admitting: Gastroenterology

## 2011-09-13 ENCOUNTER — Encounter: Payer: Self-pay | Admitting: Gastroenterology

## 2011-09-13 ENCOUNTER — Ambulatory Visit (INDEPENDENT_AMBULATORY_CARE_PROVIDER_SITE_OTHER): Payer: Medicaid Other | Admitting: Gastroenterology

## 2011-09-13 VITALS — BP 121/77 | HR 100 | Temp 98.4°F | Ht 64.0 in | Wt 125.6 lb

## 2011-09-13 DIAGNOSIS — K5090 Crohn's disease, unspecified, without complications: Secondary | ICD-10-CM

## 2011-09-13 NOTE — Assessment & Plan Note (Signed)
20 year old with Crohn's, on imuran, finished 3 doses of Remicade already (fistulizing disease). Due for next dose after Thanksgiving. Doing well with 1 BM daily, only small spots of blood seen on "sept 2". Otherwise none. Intermittent mucus discharge from rectum, rare. Enjoying school, good appetite. No fever or chills. No longer taking entocort.  CBC, HFP today 3 mos f/u with [name]
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
## 2011-09-13 NOTE — Patient Instructions (Signed)
Please have labs drawn today, and you will be seeing [name] in about 3 months.  Please contact us if you have any concerns in the meantime.

## 2011-09-14 LAB — CBC WITH DIFFERENTIAL/PLATELET
Basophils Absolute: 0 10*3/uL (ref 0.0–0.1)
Eosinophils Relative: 3 % (ref 0–5)
HCT: 38.9 % (ref 36.0–46.0)
Lymphocytes Relative: 23 % (ref 12–46)
MCH: 29.9 pg (ref 26.0–34.0)
MCHC: 32.9 g/dL (ref 30.0–36.0)
MCV: 90.9 fL (ref 78.0–100.0)
Monocytes Absolute: 0.4 10*3/uL (ref 0.1–1.0)
RDW: 13.5 % (ref 11.5–15.5)
WBC: 3.8 10*3/uL — ABNORMAL LOW (ref 4.0–10.5)

## 2011-09-14 LAB — HEPATIC FUNCTION PANEL
ALT: 8 U/L (ref 0–35)
Indirect Bilirubin: 0.4 mg/dL (ref 0.0–0.9)
Total Protein: 7.2 g/dL (ref 6.0–8.3)

## 2011-09-14 NOTE — Progress Notes (Signed)
Cc to PCP 

## 2011-09-22 ENCOUNTER — Other Ambulatory Visit: Payer: Self-pay

## 2011-09-22 DIAGNOSIS — D729 Disorder of white blood cells, unspecified: Secondary | ICD-10-CM

## 2011-09-22 NOTE — Progress Notes (Signed)
Quick Note:  Pt informed. Lab order mailed to do 10/06/2011. ______

## 2011-09-22 NOTE — Progress Notes (Signed)
Quick Note:  Liver function good. White count minimally down. Repeat in 2 weeks to see if improved. ______

## 2011-10-17 ENCOUNTER — Ambulatory Visit (HOSPITAL_COMMUNITY): Payer: Medicaid Other

## 2011-10-28 NOTE — Progress Notes (Signed)
NL HFP MILD LEUKOPENIA(WBC 3.8) NL HB REVIEWED.

## 2011-11-07 ENCOUNTER — Telehealth: Payer: Self-pay | Admitting: Gastroenterology

## 2011-11-07 NOTE — Telephone Encounter (Signed)
Needs to have CBC done as we requested.

## 2011-11-09 NOTE — Telephone Encounter (Signed)
Pt returned call. Said she had CBC drawn in hospital in [place] where she is going to school. She will call them and ask them to fax to [place]. Michela Pitcher they were supposed to have faxed).

## 2011-11-09 NOTE — Telephone Encounter (Signed)
LMOM for pt to get it done.

## 2011-11-24 ENCOUNTER — Telehealth: Payer: Self-pay

## 2011-11-24 NOTE — Telephone Encounter (Signed)
Pt asked does she need an appt to come in to get some pain medication. She is having problems with the fistula in her rectum and some drainage again. Told her I will check with Vicente Males when she gets in this morning. Please advise!

## 2011-11-24 NOTE — Telephone Encounter (Signed)
Per pt she is not having any fever. The drainage is slimmy/brown. Per Vicente Males, she will see pt at 10:30 AM tomorrow. I asked pt to be here at 10:15 for a 10:30 appt tomorrow. Also informed [name].

## 2011-11-24 NOTE — Telephone Encounter (Signed)
What kind of drainage? Any fevers?  I don't have any appts available today or tomorrow. When would she actually be able to come in?

## 2011-11-24 NOTE — Telephone Encounter (Signed)
Kendra Franklin spoke with Kendra Franklin and Pt is coming on 12/28 at 1030. I can't open the schedule to add patient. Can you add patient on for in the morning. I won't be here and Kendra Franklin will need to have an encounter form.

## 2011-11-25 ENCOUNTER — Encounter: Payer: Self-pay | Admitting: Gastroenterology

## 2011-11-25 ENCOUNTER — Ambulatory Visit (INDEPENDENT_AMBULATORY_CARE_PROVIDER_SITE_OTHER): Payer: Medicaid Other | Admitting: Gastroenterology

## 2011-11-25 VITALS — BP 120/65 | HR 100 | Temp 98.5°F | Wt 129.4 lb

## 2011-11-25 DIAGNOSIS — K5090 Crohn's disease, unspecified, without complications: Secondary | ICD-10-CM

## 2011-11-25 DIAGNOSIS — K603 Anal fistula: Secondary | ICD-10-CM

## 2011-11-25 MED ORDER — METRONIDAZOLE 500 MG PO TABS: 500.0000 mg | ORAL_TABLET | Freq: Two times a day (BID) | ORAL | Status: AC

## 2011-11-25 MED ORDER — CIPROFLOXACIN HCL 500 MG PO TABS: 500.0000 mg | ORAL_TABLET | Freq: Two times a day (BID) | ORAL | Status: DC

## 2011-11-25 MED ORDER — METRONIDAZOLE 500 MG PO TABS: 500.0000 mg | ORAL_TABLET | Freq: Two times a day (BID) | ORAL | Status: DC

## 2011-11-25 MED ORDER — CIPROFLOXACIN HCL 500 MG PO TABS: 500.0000 mg | ORAL_TABLET | Freq: Two times a day (BID) | ORAL | Status: AC

## 2011-11-25 MED ORDER — HYDROCODONE-ACETAMINOPHEN 5-500 MG PO TABS: 1.0000 | ORAL_TABLET | ORAL | Status: AC | PRN

## 2011-11-25 NOTE — Progress Notes (Signed)
Referring Provider: Johnny Bridge, MD Primary Care Physician:  Wayna Chalet, MD, MD Primary Gastroenterologist: [name]   Chief Complaint  Patient presents with  . having problems    HPI:   Kendra Franklin presents today as a work-in secondary to complaints of rectal pain, discharge, has known anal fistula. Hx of Crohn's, on Remicade and Imuran secondary to fistulizing disease. Off entocort now.  Rectal pain X 2 weeks. Reports lower abdominal pain intermittently. 1 BM daily. Denies constipation. No fever or chills. Says drainage is like "brown snot" intermittently. This is not new. No bloody BMs. No purulent drainage. Feels like fistula was "the first time it happened".   Past Medical History  Diagnosis Date  . Anal fistula   . BMI between 19-24,adult 2011 116 LBS  . Crohn's JULY 2008    DEC 2011 ASCA 100 pANCA NEG-APR 2012 6-TGN 233(LLN 230)  6-MMPN 1173 ON 75 MG IMURAN    Past Surgical History  Procedure Date  . Incision and drainage perirectal abscess   . Givens capsule study DEC 2011    SB ULCERS  . Colonoscopy DEC 2011    ILEO-COLONIC ULCERS    Current Outpatient Prescriptions  Medication Sig Dispense Refill  . azaTHIOprine (IMURAN) 50 MG tablet 2 po daily  60 tablet  11  . inFLIXimab (REMICADE) 100 MG injection Inject 300 mg into the vein every 8 (eight) weeks.  1 each  12  . medroxyPROGESTERone (DEPO-SUBQ PROVERA) 104 MG/0.65ML injection Inject 104 mg into the skin every 3 (three) months.        . Polyethylene Glycol 3350 (MIRALAX PO) Take 17 g by mouth 1 dose over 46 hours.        . ciprofloxacin (CIPRO) 500 MG tablet Take 1 tablet (500 mg total) by mouth 2 (two) times daily. For 7 days  14 tablet  0  . HYDROcodone-acetaminophen (VICODIN) 5-500 MG per tablet Take 1 tablet by mouth every 4 (four) hours as needed for pain.  20 tablet  0  . metroNIDAZOLE (FLAGYL) 500 MG tablet Take 1 tablet (500 mg total) by mouth 2 (two) times daily.  14 tablet  0    Allergies as of 11/25/2011  - Review Complete 11/25/2011  Allergen Reaction Noted  . Morphine      Family History  Problem Relation Age of Onset  . Colon cancer Neg Hx   . Colon polyps Neg Hx   . Inflammatory bowel disease Neg Hx     History   Social History  . Marital Status: Single    Spouse Name: N/A    Number of Children: N/A  . Years of Education: N/A   Social History Main Topics  . Smoking status: Never Smoker   . Smokeless tobacco: Never Used  . Alcohol Use: No  . Drug Use: No  . Sexually Active: No   Other Topics Concern  . None   Social History Narrative   Graduating this spring. Straight A student. Going to [employer]. Mother: Arlyss Repress. No siblings. Mother smokes.    Review of Systems: Gen: Denies fever, chills, anorexia. Denies fatigue, weakness, weight loss.  CV: Denies chest pain, palpitations, syncope, peripheral edema, and claudication. Resp: Denies dyspnea at rest, cough, wheezing, coughing up blood, and pleurisy. GI: Denies vomiting blood, jaundice, and fecal incontinence.   Denies dysphagia or odynophagia. Derm: Denies rash, itching, dry skin Psych: Denies depression, anxiety, memory loss, confusion. No homicidal or suicidal ideation.  Heme: Denies bruising, bleeding, and enlarged  lymph nodes.  Physical Exam: BP 120/65  Pulse 100  Temp(Src) 98.5 F (36.9 C) (Temporal)  Wt 129 lb 6.4 oz (58.695 kg) General:   Alert and oriented. No distress noted. Pleasant and cooperative.  Head:  Normocephalic and atraumatic. Eyes:  Conjuctiva clear without scleral icterus. Mouth:  Oral mucosa pink and moist. Good dentition. No lesions. Heart:  S1, S2 present without murmurs, rubs, or gallops. Regular rate and rhythm. Abdomen:  +BS, soft, non-tender and non-distended. No rebound or guarding. No HSM or masses noted. Rectal: no erythema, edema, purulent drainage, visible drainage, small 1 cm very slightly raised area TTP at 11oclock around at rectum.  Msk:  Symmetrical without  gross deformities. Normal posture. Extremities:  Without edema. Neurologic:  Alert and  oriented x4;  grossly normal neurologically. Skin:  Intact without significant lesions or rashes. Psych:  Alert and cooperative. Normal mood and affect.

## 2011-11-25 NOTE — Assessment & Plan Note (Signed)
On Imuran and Remicade. At baseline, no bloody BMs. No fever or chills. Return in March to see [name]. Pt unable to come sooner.

## 2011-11-25 NOTE — Assessment & Plan Note (Signed)
Hx of anal fistula, on Remicade due to fistulizing disease. Small area at rectum TTP, no drainage, erythema, purulence noted. No fever or chills. Due to increased discomfort, pain, will give short course of empiric abx X 7 days. Instructed to call with any changes, worsening of symptoms, drainage. Short course of vicodin # 20, instructed can cause constipation, take Miralax prn. Asked to f/u in 2 weeks but unable to do so because of school. Will come in March to see [name] only.

## 2011-11-25 NOTE — Patient Instructions (Signed)
Complete the abx X 1 week. Monitor for any fever/chills, or other issues.  You may take the pain medicine as needed, but watch for constipation. You may take Miralax to keep yourself regular.  We will see you back in 2 weeks with [name].

## 2011-11-28 NOTE — Progress Notes (Signed)
Cc to PCP 

## 2011-11-30 NOTE — Telephone Encounter (Signed)
Pt was seen and has reminder to have OV in 3 months with SF

## 2011-12-07 ENCOUNTER — Telehealth: Payer: Self-pay | Admitting: Gastroenterology

## 2011-12-07 NOTE — Telephone Encounter (Signed)
Patient wants to speak to Vicente Males Regarding antibiotics please return call

## 2011-12-07 NOTE — Telephone Encounter (Signed)
Spoke with pt. Able to complete 3 days of abx, then N/V. Stopped last Thursday taking. Improved since then. Decreased rectal pain. No other complaints. Doing well. Wanted to inform us.

## 2012-01-04 ENCOUNTER — Telehealth: Payer: Self-pay | Admitting: Gastroenterology

## 2012-01-04 NOTE — Telephone Encounter (Signed)
Called and informed pt.  

## 2012-01-04 NOTE — Telephone Encounter (Signed)
Pt called in- she is having abd pain and nausea- concerned she is having a flare up of her Crohns- please call

## 2012-01-04 NOTE — Telephone Encounter (Signed)
Called and spoke with pt. She said she has been having abdominal pain since last night. It has been constant since 10:00 AM this morning. On a scale of 1-10 she rates it a 6. She is having nausea but no vomiting. No diarrhea. Please advise!

## 2012-01-04 NOTE — Telephone Encounter (Signed)
Please call pt. She may have a mild viral illness. She should use Tylenol prn for abdominal cramps. She will not need more aggressive therapy unless the pain continues and is associated with bloody diarrhea. She should stay hydrated-keep her urine light yellow, and eat a bland diet until the pain and nausea go away. Call in 7 days with an update.

## 2012-02-16 ENCOUNTER — Telehealth: Payer: Self-pay | Admitting: Gastroenterology

## 2012-02-16 NOTE — Telephone Encounter (Signed)
Spoke to pt having lower abdominal pain since MON getting better. Nl stools, No rectal bleeding. Mild rectal pain with passing stool. Was late getting her Depo Shot. Last sexual activity FEB 14. Doesn't think she's pregnant. Pain getting better. Recommended: 1. See [facility] for a UA, 2. Use Tylenol prn for pain. Call with questions.

## 2012-02-16 NOTE — Telephone Encounter (Signed)
Called pt. She said she has been having abdominal pain since Mon. At lower abdomen, all the way across. She has some nausea, but no vomiting. Had a BM this AM. Normal. Not hard, but had a little rectal pain immediately after passing the BM. No fever. She wants to know what she can take for pain. Please advise!

## 2012-02-16 NOTE — Telephone Encounter (Signed)
Pt called today asking to speak with SF. I told patient that SF was in the office today, but she was seeing patients. Please call patient at [phone]

## 2012-02-21 ENCOUNTER — Encounter: Payer: Self-pay | Admitting: Gastroenterology

## 2012-03-19 ENCOUNTER — Telehealth: Payer: Self-pay

## 2012-03-19 NOTE — Telephone Encounter (Signed)
Pt called to see if she can get her Remicade after 03/30/2012. She will be out of school then. I called Mazi at the [facility]. ( Pt had told me that she had her last treatment on 02/14/2012) Per Mazi, pt has many no shows and last had a Remicade treatment on 07/10/2012. She cancelled her appt for 03/22/2012 and has rescheduled for 04/11/2012. Per [facility], pt will need OV prior to having a treatment.

## 2012-03-20 ENCOUNTER — Encounter: Payer: Self-pay | Admitting: Gastroenterology

## 2012-03-20 NOTE — Telephone Encounter (Signed)
Tried to call pt. Her phone number of [phone] has been disconnected. Will mail letter for her to call to get earlier appt.

## 2012-03-20 NOTE — Telephone Encounter (Signed)
I mailed appt card to patient to come see SF (E30) on 03/28/12 at 10am and also left 04/11/12 OV with SF until I heard back from patient since phone number has been changed.

## 2012-03-20 NOTE — Telephone Encounter (Signed)
GET PT AN APPT 1ST AVAILABLE BEFORE 5/15 E 30. PT GETS REMICADE NEAR HER SCHOOL.

## 2012-03-22 ENCOUNTER — Ambulatory Visit: Payer: Medicaid Other | Admitting: Gastroenterology

## 2012-03-28 ENCOUNTER — Telehealth: Payer: Self-pay | Admitting: Gastroenterology

## 2012-03-28 ENCOUNTER — Ambulatory Visit: Payer: Medicaid Other | Admitting: Gastroenterology

## 2012-03-28 NOTE — Telephone Encounter (Signed)
PT WAS A NO SHOW

## 2012-03-28 NOTE — Telephone Encounter (Signed)
CALLED MOM. LEFT VM-CALL ME AT [phone]

## 2012-04-11 ENCOUNTER — Encounter: Payer: Self-pay | Admitting: Gastroenterology

## 2012-04-11 ENCOUNTER — Ambulatory Visit (INDEPENDENT_AMBULATORY_CARE_PROVIDER_SITE_OTHER): Payer: Medicaid Other | Admitting: Gastroenterology

## 2012-04-11 VITALS — BP 117/79 | HR 119 | Temp 98.8°F | Ht 64.0 in | Wt 129.0 lb

## 2012-04-11 DIAGNOSIS — K5090 Crohn's disease, unspecified, without complications: Secondary | ICD-10-CM

## 2012-04-11 DIAGNOSIS — K5010 Crohn's disease of large intestine without complications: Secondary | ICD-10-CM

## 2012-04-11 LAB — CBC WITH DIFFERENTIAL/PLATELET
Basophils Absolute: 0 10*3/uL (ref 0.0–0.1)
Basophils Relative: 0 % (ref 0–1)
Eosinophils Absolute: 0.1 10*3/uL (ref 0.0–0.7)
Lymphs Abs: 0.8 10*3/uL (ref 0.7–4.0)
MCH: 31.1 pg (ref 26.0–34.0)
MCHC: 34.6 g/dL (ref 30.0–36.0)
Neutrophils Relative %: 54 % (ref 43–77)
Platelets: 268 10*3/uL (ref 150–400)
RBC: 4.56 MIL/uL (ref 3.87–5.11)

## 2012-04-11 LAB — COMPREHENSIVE METABOLIC PANEL
ALT: 10 U/L (ref 0–35)
AST: 19 U/L (ref 0–37)
Alkaline Phosphatase: 56 U/L (ref 39–117)
Sodium: 137 mEq/L (ref 135–145)
Total Bilirubin: 0.6 mg/dL (ref 0.3–1.2)
Total Protein: 7.4 g/dL (ref 6.0–8.3)

## 2012-04-11 NOTE — Patient Instructions (Signed)
GET YOUR LABS CHECKED TODAY.  WILL [facility]X [facility] TO [facility] SO YOU CAN GET YOUR REMICADE.  FOLLOW UP IN 6 MOS.

## 2012-04-12 ENCOUNTER — Other Ambulatory Visit: Payer: Self-pay

## 2012-04-12 DIAGNOSIS — K5090 Crohn's disease, unspecified, without complications: Secondary | ICD-10-CM

## 2012-04-12 NOTE — Progress Notes (Signed)
Quick Note:  Called and informed pt. CBC on file for May 14, 2012. ______

## 2012-04-12 NOTE — Assessment & Plan Note (Signed)
SX CONTROLLED. TOLERATING REMICADE AND IMURAN.  CHECK CBC/DIFF, CMP, URINE HCG. CHECK LAB Q6 MOS. CONTINUE REMICADE Q8 WEEKS AND IMURAN. OPV IN 6 MOS.

## 2012-04-12 NOTE — Progress Notes (Signed)
Faxed to PCP

## 2012-04-12 NOTE — Progress Notes (Signed)
Reminder in epic to follow up in 6 months with SF in E30

## 2012-04-12 NOTE — Progress Notes (Signed)
  Subjective:    Patient ID: Kendra Franklin, female    DOB: [birth year], 21 y.o.   MRN: [id number]  HPI PT [employer]. GOING TO [place], GPA > 3.5. GOT A B IN BIOLOGY. NO SIDE EFFECTS OR REACTIONS TO INFUSION. NO NODULE ON RECTUM OR RECTAL PAIN. OCCASIONALLY SEES MUCOUS. NL STOOLS. NO ABD PAIN. LMP FEB 2013. LAST SEXUAL ACTIVITY FEB 2013. No BRBPR, nausea, vomiting, melena, diarrhea, constipation, problems swallowing or heartburn or indigestion. LAST LAB DRAW?  Past Medical History  Diagnosis Date  . Anal fistula   . BMI between 19-24,adult 2011 116 LBS  . Crohn's JULY 2008    DEC 2011 ASCA 100 pANCA NEG-APR 2012 6-TGN 233(LLN 230)  6-MMPN 1173 ON 75 MG IMURAN    Past Surgical History  Procedure Date  . Incision and drainage perirectal abscess   . Givens capsule study DEC 2011    SB ULCERS  . Colonoscopy DEC 2011    ILEO-COLONIC ULCERS   Allergies  Allergen Reactions  . Morphine    Current Outpatient Prescriptions  Medication Sig Dispense Refill  . azaTHIOprine (IMURAN) 50 MG tablet 2 po daily    . HYDROcodone-acetaminophen (VICODIN) 5-500 MG per tablet Take 1 tablet by mouth every 4 (four) hours as needed for pain.    Marland Kitchen inFLIXimab (REMICADE) 100 MG injection Inject 300 mg into the vein every 8 (eight) weeks.    . medroxyPROGESTERone (DEPO-SUBQ PROVERA) 104 MG/0.65ML injection Inject 104 mg into the skin every 3 (three) months.        .             Review of Systems     Objective:   Physical Exam  Vitals reviewed. Constitutional: She is oriented to person, place, and time. She appears well-nourished. No distress.  HENT:  Head: Normocephalic and atraumatic.  Mouth/Throat: Oropharynx is clear and moist. No oropharyngeal exudate.  Eyes: Conjunctivae are normal. Pupils are equal, round, and reactive to light.  Cardiovascular: Normal rate, regular rhythm and normal heart sounds.   Pulmonary/Chest: Breath sounds normal. No respiratory distress.    Abdominal: Soft. Bowel sounds are normal. She exhibits no distension. There is no tenderness.  Musculoskeletal: She exhibits no edema.  Neurological: She is alert and oriented to person, place, and time.       NO FOCAL DEFICITS   Skin:       NO PRETIBIAL LESIONS  Psychiatric: She has a normal mood and affect.          Assessment & Plan:

## 2012-04-19 ENCOUNTER — Ambulatory Visit (HOSPITAL_COMMUNITY): Payer: Medicaid Other

## 2012-04-25 ENCOUNTER — Encounter (HOSPITAL_COMMUNITY): Payer: Medicaid Other | Attending: Gastroenterology

## 2012-04-25 VITALS — BP 111/69 | HR 90 | Temp 98.4°F

## 2012-04-25 DIAGNOSIS — K5090 Crohn's disease, unspecified, without complications: Secondary | ICD-10-CM

## 2012-04-25 MED ORDER — SODIUM CHLORIDE 0.9 % IJ SOLN
10.0000 mL | INTRAMUSCULAR | Status: DC | PRN
  Administered 2012-04-25: 10 mL via INTRAVENOUS

## 2012-04-25 MED ORDER — SODIUM CHLORIDE 0.9 % IV SOLN
INTRAVENOUS | Status: DC
  Administered 2012-04-25: 13:00:00 via INTRAVENOUS

## 2012-04-25 MED ORDER — SODIUM CHLORIDE 0.9 % IV SOLN
300.0000 mg | Freq: Once | INTRAVENOUS | Status: AC
  Administered 2012-04-25: 300 mg via INTRAVENOUS
  Filled 2012-04-25: qty 30

## 2012-04-25 NOTE — Progress Notes (Signed)
Tolerated remicade infusion well.

## 2012-04-30 ENCOUNTER — Telehealth: Payer: Self-pay

## 2012-04-30 ENCOUNTER — Other Ambulatory Visit: Payer: Self-pay

## 2012-04-30 DIAGNOSIS — K5090 Crohn's disease, unspecified, without complications: Secondary | ICD-10-CM

## 2012-04-30 MED ORDER — AZATHIOPRINE 50 MG PO TABS: ORAL_TABLET | ORAL | Status: DC

## 2012-04-30 NOTE — Telephone Encounter (Signed)
Pt's mother came by the office stating that she is having pain all down here left side and hurts in her bones. The Pt called early today needing a refill on her medication and did not stated anything about pain. Please advise

## 2012-05-01 ENCOUNTER — Ambulatory Visit: Payer: Medicaid Other | Admitting: Urgent Care

## 2012-05-01 ENCOUNTER — Encounter: Payer: Self-pay | Admitting: Gastroenterology

## 2012-05-01 ENCOUNTER — Ambulatory Visit (INDEPENDENT_AMBULATORY_CARE_PROVIDER_SITE_OTHER): Payer: Medicaid Other | Admitting: Gastroenterology

## 2012-05-01 VITALS — BP 107/67 | HR 90 | Temp 98.8°F | Ht 64.0 in | Wt 128.4 lb

## 2012-05-01 DIAGNOSIS — K5090 Crohn's disease, unspecified, without complications: Secondary | ICD-10-CM

## 2012-05-01 NOTE — Telephone Encounter (Signed)
GER HER THE FIRST AVAILABLE OPV-USE AN URGENT SPOT.

## 2012-05-01 NOTE — Telephone Encounter (Signed)
PLEASE CALL PT. SHE SHOULD MAKE AN APPT IF SHE IS HAVING PAIN.

## 2012-05-01 NOTE — Progress Notes (Signed)
Primary Care Physician: [name], MD, MD  Primary Gastroenterologist:  Barney Drain, MD   Chief Complaint  Patient presents with  . Abdominal Pain    pains going down left leg at times    HPI: Kendra Franklin is a 21 y.o. female here for urgent visit. Patient's mother stopped by office yesterday stating patient was having pain down left side and into her bones. Patient had called earlier in the day for medication refill but didn't mention the pain.   Remicade last week. Woke up yesterday with lower abd pain. Crampy pain. Had a BM. Ate something and went back to sleep. Couple of hours later woke up and felt weak. No nausea, no fever. Both hands shaking, pain in left knee for ONE second. No pain in back. No further symptoms. Feels good today, back to normal. Always with mucous in stools. bm one or two per day. No blood. Depo shot scheduled for today. No menstrual cycles. No dysuria. Urine is light yellow. Eating well. Drinking plenty of water.    Current Outpatient Prescriptions  Medication Sig Dispense Refill  . azaTHIOprine (IMURAN) 50 MG tablet 2 po daily  60 tablet  5  . HYDROcodone-acetaminophen (VICODIN) 5-500 MG per tablet Take 1 tablet by mouth every 4 (four) hours as needed for pain.  20 tablet  0  . inFLIXimab (REMICADE) 100 MG injection Inject 300 mg into the vein every 8 (eight) weeks.  1 each  12  . medroxyPROGESTERone (DEPO-SUBQ PROVERA) 104 MG/0.65ML injection Inject 104 mg into the skin every 3 (three) months.        . Polyethylene Glycol 3350 (MIRALAX PO) Take 17 g by mouth 1 dose over 46 hours.          Allergies as of 05/01/2012 - Review Complete 05/01/2012  Allergen Reaction Noted  . Morphine      ROS:  General: Negative for anorexia, weight loss, fever, chills, fatigue, weakness. ENT: Negative for hoarseness, difficulty swallowing , nasal congestion. CV: Negative for chest pain, angina, palpitations, dyspnea on exertion, peripheral edema.  Respiratory:  Negative for dyspnea at rest, dyspnea on exertion, cough, sputum, wheezing.  GI: See history of present illness. GU:  Negative for dysuria, hematuria, urinary incontinence, urinary frequency, nocturnal urination.  Endo: Negative for unusual weight change.    Physical Examination:   BP 107/67  Pulse 90  Temp(Src) 98.8 F (37.1 C) (Temporal)  Ht 5' 4"  (1.626 m)  Wt 128 lb 6.4 oz (58.242 kg)  BMI 22.04 kg/m2  General: Well-nourished, well-developed in no acute distress. Well-dressed. Smiling.  Eyes: No icterus. Mouth: Oropharyngeal mucosa moist and pink , no lesions erythema or exudate. Lungs: Clear to auscultation bilaterally.  Heart: Regular rate and rhythm, no murmurs rubs or gallops.  Abdomen: Bowel sounds are normal, nontender, nondistended, no hepatosplenomegaly or masses, no abdominal bruits or hernia , no rebound or guarding.   Extremities: No lower extremity edema. No clubbing or deformities. Neuro: Alert and oriented x 4   Skin: Warm and dry, no jaundice.   Psych: Alert and cooperative, normal mood and affect.

## 2012-05-01 NOTE — Progress Notes (Signed)
Faxed to PCP

## 2012-05-01 NOTE — Telephone Encounter (Signed)
I called and spoke to mom. Pt is available to come in, but there are no appt slots. Please advise!

## 2012-05-01 NOTE — Assessment & Plan Note (Signed)
Patient presents with single episode of abdominal cramping, left knee pain which lasted for one second, felt weak. Today feels normal. No evidence of dehydration. Doubt electrolyte abnormalities given resolution of symptoms. No evidence of active Crohn's. Would simply monitor for any recurrence of symptoms. CBC week of June 17th as planned for leukopenia. Otherwise, plan as per OV 03/2012.

## 2012-05-01 NOTE — Patient Instructions (Signed)
Please call if you have any recurrent leg pain, shakes, persistent abdominal pain, especially if occuring for more than 24 hours.

## 2012-05-01 NOTE — Telephone Encounter (Signed)
Pt is scheduled to see Vickey Huger, NP at 11:30 AM today.

## 2012-05-14 ENCOUNTER — Telehealth: Payer: Self-pay | Admitting: Gastroenterology

## 2012-05-14 LAB — CBC WITH DIFFERENTIAL/PLATELET
Basophils Absolute: 0 10*3/uL (ref 0.0–0.1)
Basophils Relative: 1 % (ref 0–1)
Eosinophils Relative: 2 % (ref 0–5)
HCT: 39.3 % (ref 36.0–46.0)
Lymphocytes Relative: 37 % (ref 12–46)
MCHC: 34.4 g/dL (ref 30.0–36.0)
MCV: 88.7 fL (ref 78.0–100.0)
Monocytes Absolute: 0.3 10*3/uL (ref 0.1–1.0)
Monocytes Relative: 6 % (ref 3–12)
RDW: 13.7 % (ref 11.5–15.5)

## 2012-05-14 NOTE — Telephone Encounter (Signed)
Message in error °

## 2012-05-17 ENCOUNTER — Ambulatory Visit: Payer: Medicaid Other | Admitting: Urgent Care

## 2012-05-17 ENCOUNTER — Telehealth: Payer: Self-pay | Admitting: Gastroenterology

## 2012-05-17 NOTE — Telephone Encounter (Signed)
PLEASE CALL PT. HER BLOOD COUNT IS NORMAL.

## 2012-05-18 NOTE — Telephone Encounter (Signed)
See [facility] note. CBC normal.  Recheck CBC in 3 months.

## 2012-05-21 ENCOUNTER — Other Ambulatory Visit: Payer: Self-pay

## 2012-05-21 DIAGNOSIS — K5090 Crohn's disease, unspecified, without complications: Secondary | ICD-10-CM

## 2012-05-21 NOTE — Telephone Encounter (Signed)
Pt's mom aware. Lab order on file for 08/21/2012.

## 2012-06-28 NOTE — Progress Notes (Signed)
REVIEWED.  

## 2012-06-28 NOTE — Progress Notes (Incomplete)
.  revie

## 2012-07-09 ENCOUNTER — Encounter (HOSPITAL_COMMUNITY): Payer: Medicaid Other | Attending: Gastroenterology

## 2012-07-09 VITALS — BP 102/66 | HR 92 | Temp 98.3°F | Resp 18 | Wt 128.4 lb

## 2012-07-09 DIAGNOSIS — K5090 Crohn's disease, unspecified, without complications: Secondary | ICD-10-CM | POA: Insufficient documentation

## 2012-07-09 MED ORDER — SODIUM CHLORIDE 0.9 % IV SOLN
INTRAVENOUS | Status: DC
  Administered 2012-07-09: 10:00:00 via INTRAVENOUS

## 2012-07-09 MED ORDER — SODIUM CHLORIDE 0.9 % IV SOLN
300.0000 mg | Freq: Once | INTRAVENOUS | Status: AC
  Administered 2012-07-09: 300 mg via INTRAVENOUS
  Filled 2012-07-09: qty 30

## 2012-07-09 NOTE — Progress Notes (Signed)
Tolerated infusion without problems.

## 2012-08-17 ENCOUNTER — Other Ambulatory Visit: Payer: Self-pay | Admitting: Gastroenterology

## 2012-09-06 ENCOUNTER — Encounter: Payer: Self-pay | Admitting: *Deleted

## 2012-09-11 ENCOUNTER — Encounter: Payer: Self-pay | Admitting: Gastroenterology

## 2012-09-11 ENCOUNTER — Ambulatory Visit (INDEPENDENT_AMBULATORY_CARE_PROVIDER_SITE_OTHER): Payer: Medicaid Other | Admitting: Gastroenterology

## 2012-09-11 VITALS — BP 119/79 | HR 101 | Temp 97.4°F | Ht 61.0 in | Wt 124.2 lb

## 2012-09-11 DIAGNOSIS — K5090 Crohn's disease, unspecified, without complications: Secondary | ICD-10-CM

## 2012-09-11 NOTE — Progress Notes (Signed)
Kendra Franklin, please check to see if Remicade orders are expiring soon. Patient states she would need orders by December in [place].  [name], please NIC for OV six months with SLF only.

## 2012-09-11 NOTE — Progress Notes (Signed)
Primary Care Physician: [name], MD  Primary Gastroenterologist:  Barney Drain, MD   Chief Complaint  Patient presents with  . Follow-up    HPI: Kendra Franklin is a 21 y.o. female here for f/u of ileocolonic Crohn's disease. Last Remicade injection was 09/04/12.  Stomach cramps start up right before next Remicade is due. BM once daily, normal. No black/melena. Finishes college in 03/2013 with Associates degree. Not taking vicodin. Not taking Miralax. Wonders how long she will need to be on Remicade.    Current Outpatient Prescriptions  Medication Sig Dispense Refill  . azaTHIOprine (IMURAN) 50 MG tablet TAKE TWO TABLETS BY MOUTH EVERY DAY  60 tablet  1  . HYDROcodone-acetaminophen (VICODIN) 5-500 MG per tablet Take 1 tablet by mouth every 4 (four) hours as needed for pain.  20 tablet  0  . inFLIXimab (REMICADE) 100 MG injection Inject 300 mg into the vein every 8 (eight) weeks.  1 each  12  . medroxyPROGESTERone (DEPO-SUBQ PROVERA) 104 MG/0.65ML injection Inject 104 mg into the skin every 3 (three) months.        . Polyethylene Glycol 3350 (MIRALAX PO) Take 17 g by mouth 1 dose over 46 hours.          Allergies as of 09/11/2012 - Review Complete 09/11/2012  Allergen Reaction Noted  . Morphine      ROS:  General: Negative for anorexia, weight loss, fever, chills, fatigue, weakness. ENT: Negative for hoarseness, difficulty swallowing , nasal congestion. CV: Negative for chest pain, angina, palpitations, dyspnea on exertion, peripheral edema.  Respiratory: Negative for dyspnea at rest, dyspnea on exertion, cough, sputum, wheezing.  GI: See history of present illness. GU:  Negative for dysuria, hematuria, urinary incontinence, urinary frequency, nocturnal urination.  Endo: Negative for unusual weight change.    Physical Examination:   BP 119/79  Pulse 101  Temp 97.4 F (36.3 C) (Temporal)  Ht 5' 1"  (1.549 m)  Wt 124 lb 3.2 oz (56.337 kg)  BMI 23.47 kg/m2  General:  Well-nourished, well-developed in no acute distress.  Eyes: No icterus. Mouth: Oropharyngeal mucosa moist and pink , no lesions erythema or exudate. Lungs: Clear to auscultation bilaterally.  Heart: Regular rate and rhythm, no murmurs rubs or gallops.  Abdomen: Bowel sounds are normal, nontender, nondistended, no hepatosplenomegaly or masses, no abdominal bruits or hernia , no rebound or guarding.   Extremities: No lower extremity edema. No clubbing or deformities. Neuro: Alert and oriented x 4   Skin: Warm and dry, no jaundice.   Psych: Alert and cooperative, normal mood and affect.

## 2012-09-11 NOTE — Patient Instructions (Addendum)
I will discuss Remicade further with [name] and get in touch with you. I think you are doing great! Keep up the good work!

## 2012-09-11 NOTE — Assessment & Plan Note (Addendum)
Doing well on Imuran and Remicade. No signs of side effects. Due for CBC, LFTs now.  Office visit in six months with SLF only.

## 2012-09-12 NOTE — Progress Notes (Signed)
Faxed to PCP

## 2012-09-13 ENCOUNTER — Telehealth: Payer: Self-pay | Admitting: Gastroenterology

## 2012-09-13 NOTE — Progress Notes (Signed)
Reminder in epic to follow up with SF only in E30 in 6 months

## 2012-09-13 NOTE — Progress Notes (Signed)
faxed

## 2012-09-13 NOTE — Telephone Encounter (Signed)
Pt called to follow up on her remicaid treatments. Someone had called her yesterday, but doesn't remember who it was. Please advise and call her back.

## 2012-09-13 NOTE — Progress Notes (Signed)
Dawn, please fax copy of OV note to [facility] [place]/Charles Hunter, PA-C. Fax [phone].

## 2012-09-13 NOTE — Progress Notes (Signed)
Please let patient know. I discussed with [name]. Plan to continue Remicade indefinetly. Patient is also due her CBC and LFTs. Please make arrangements.

## 2012-09-18 ENCOUNTER — Other Ambulatory Visit: Payer: Self-pay

## 2012-09-18 DIAGNOSIS — K5090 Crohn's disease, unspecified, without complications: Secondary | ICD-10-CM

## 2012-09-18 NOTE — Progress Notes (Signed)
Pt is aware she needs labs. Orders were faxed to [facility]. She said she is coming home this week-end and can do Friday afternoon or Sat before noon.   She also left me the name of the person to contact in [place] about her Remicade Kendra Franklin  [phone]  Ext [phone] Jaisa said she has orders through Jan 2014. I will call and talk to Faith today.

## 2012-09-18 NOTE — Telephone Encounter (Signed)
See addendum to OV note of  09/11/2012.

## 2012-09-20 NOTE — Progress Notes (Signed)
Called Faith and confirmed that she received the info needed.

## 2012-09-20 NOTE — Progress Notes (Signed)
I called Faith. I told her that pt is to continue Remicade indefinitely per [name]. She said she just needs that statement faxed to her at [phone] and ATTN: Faith. That is all she needs. Pt is scheduled to go back on 10/30/2012.

## 2012-09-20 NOTE — Progress Notes (Signed)
Faxed the OV notes to Faith.

## 2012-09-20 NOTE — Progress Notes (Signed)
OK to fax this OV for documentation of indefinetly Remicade.

## 2012-09-22 LAB — HEPATIC FUNCTION PANEL
ALT: <8 U/L (ref 0–35)
AST: 16 U/L (ref 0–37)
Albumin: 4.3 g/dL (ref 3.5–5.2)
Total Bilirubin: 0.9 mg/dL (ref 0.3–1.2)
Total Protein: 7 g/dL (ref 6.0–8.3)

## 2012-09-22 LAB — CBC WITH DIFFERENTIAL/PLATELET
Basophils Relative: 0 % (ref 0–1)
Eosinophils Absolute: 0.1 10*3/uL (ref 0.0–0.7)
Eosinophils Relative: 1 % (ref 0–5)
Hemoglobin: 12.5 g/dL (ref 12.0–15.0)
MCH: 30.9 pg (ref 26.0–34.0)
MCHC: 34.6 g/dL (ref 30.0–36.0)
MCV: 89.4 fL (ref 78.0–100.0)
Monocytes Relative: 8 % (ref 3–12)
Neutrophils Relative %: 61 % (ref 43–77)

## 2012-09-24 ENCOUNTER — Other Ambulatory Visit: Payer: Self-pay

## 2012-09-24 DIAGNOSIS — K5090 Crohn's disease, unspecified, without complications: Secondary | ICD-10-CM

## 2012-09-24 NOTE — Progress Notes (Signed)
Quick Note:  Called pt. Her mom answered and I informed her of the results and lab order on file for 12/25/2012. ______

## 2012-09-24 NOTE — Progress Notes (Signed)
Quick Note:  Please let pt know her labs are normal. CBC in 3 months. ______

## 2012-10-06 NOTE — Progress Notes (Signed)
REVIEWED.  

## 2012-10-30 ENCOUNTER — Telehealth: Payer: Self-pay | Admitting: Gastroenterology

## 2012-10-30 ENCOUNTER — Other Ambulatory Visit: Payer: Self-pay

## 2012-10-30 DIAGNOSIS — K5090 Crohn's disease, unspecified, without complications: Secondary | ICD-10-CM

## 2012-10-30 NOTE — Telephone Encounter (Signed)
REVIEWED.  

## 2012-10-30 NOTE — Telephone Encounter (Signed)
Mrs. Kendra Franklin called and stated that the place that's been giving Kendra Franklin her Remicade states that they can no longer do it anymore because Medicaid is not paying for them to give it, Her mother is asking what can they do because Kendra Franklin needs this medication, please advise?

## 2012-10-30 NOTE — Telephone Encounter (Signed)
I called and spoke to [place]. She said her insurance will no longer cover the Remicade at the school. They have transferred the orders to [facility], that is in [facility] and is about 45 min from where she goes to school. She said she is already scheduled and there doesn't seem to be a probem, and she will let us know if it is.

## 2012-11-12 LAB — CBC WITH DIFFERENTIAL/PLATELET
Basophils Absolute: 0 10*3/uL (ref 0.0–0.1)
Basophils Relative: 1 % (ref 0–1)
HCT: 37.9 % (ref 36.0–46.0)
Lymphocytes Relative: 37 % (ref 12–46)
MCHC: 34.8 g/dL (ref 30.0–36.0)
Neutro Abs: 1.7 10*3/uL (ref 1.7–7.7)
Neutrophils Relative %: 48 % (ref 43–77)
Platelets: 263 10*3/uL (ref 150–400)
RDW: 13.1 % (ref 11.5–15.5)
WBC: 3.4 10*3/uL — ABNORMAL LOW (ref 4.0–10.5)

## 2012-11-13 ENCOUNTER — Other Ambulatory Visit: Payer: Self-pay

## 2012-11-13 DIAGNOSIS — D72819 Decreased white blood cell count, unspecified: Secondary | ICD-10-CM

## 2012-11-13 NOTE — Progress Notes (Signed)
Quick Note:  Called and informed pt. Lab order on file for 01/15/2012. ______

## 2012-11-13 NOTE — Progress Notes (Signed)
Quick Note:  Labs look okay except for slightly low WBC. Recheck CBC in 2 months. ______

## 2012-11-14 ENCOUNTER — Telehealth: Payer: Self-pay

## 2012-11-14 NOTE — Telephone Encounter (Signed)
Called and informed pt.  

## 2012-11-14 NOTE — Telephone Encounter (Signed)
PLEASE CALL PT.  IF SHE IS HAVING HARD STOOLS, SHE SHOULD USE MIRALAX ONCE OR TWICE DAILY. HOLD FOR DIARRHEA/LOOSE STOOLS. SHE SHOULD USE PREP H QID FOR 5 DAYS. CALL IN 7 DAYS IF STILL HAVING HARD STOOLS OR BLOOD IN HER STOOL. LET HER KNOW I AM ON CALL FOR ON THE DAYS WE ARE CLOSED.

## 2012-11-14 NOTE — Telephone Encounter (Signed)
Pt called and said she had just a couple of spots of blood in her stool this AM. She said her Mom called and told her to ask what to do if it happens while we are out for Christmas. She said she had a hard stool this AM and she is having BM's mostly every other day now and they are usually hard. Please advise!

## 2012-12-28 ENCOUNTER — Encounter (HOSPITAL_COMMUNITY): Payer: Medicaid Other | Attending: Gastroenterology

## 2012-12-28 VITALS — BP 99/52 | HR 105 | Temp 98.2°F | Resp 16 | Wt 124.0 lb

## 2012-12-28 DIAGNOSIS — K5090 Crohn's disease, unspecified, without complications: Secondary | ICD-10-CM | POA: Insufficient documentation

## 2012-12-28 MED ORDER — ACETAMINOPHEN 325 MG PO TABS
650.0000 mg | ORAL_TABLET | Freq: Once | ORAL | Status: AC
  Administered 2012-12-28: 650 mg via ORAL

## 2012-12-28 MED ORDER — DEXAMETHASONE SODIUM PHOSPHATE 10 MG/ML IJ SOLN: 8.0000 mg | Freq: Once | INTRAMUSCULAR | Status: DC

## 2012-12-28 MED ORDER — SODIUM CHLORIDE 0.9 % IV SOLN
Freq: Once | INTRAVENOUS | Status: AC
  Administered 2012-12-28: 10:00:00 via INTRAVENOUS

## 2012-12-28 MED ORDER — DIPHENHYDRAMINE HCL 50 MG/ML IJ SOLN: 25.0000 mg | Freq: Once | INTRAMUSCULAR | Status: DC

## 2012-12-28 MED ORDER — SODIUM CHLORIDE 0.9 % IV SOLN
300.0000 mg | Freq: Once | INTRAVENOUS | Status: AC
  Administered 2012-12-28: 300 mg via INTRAVENOUS
  Filled 2012-12-28: qty 30

## 2012-12-28 NOTE — Progress Notes (Signed)
Remicade infusion completed, line flushed with NS and IV D/C.  Tolerated infusion without complaints.

## 2012-12-28 NOTE — Progress Notes (Signed)
Tolerated well

## 2013-01-08 ENCOUNTER — Other Ambulatory Visit: Payer: Self-pay | Admitting: Gastroenterology

## 2013-01-20 LAB — CBC WITH DIFFERENTIAL/PLATELET
Eosinophils Absolute: 0.1 10*3/uL (ref 0.0–0.7)
Hemoglobin: 12.7 g/dL (ref 12.0–15.0)
Lymphs Abs: 1.7 10*3/uL (ref 0.7–4.0)
Monocytes Relative: 7 % (ref 3–12)
Neutro Abs: 1.9 10*3/uL (ref 1.7–7.7)
Neutrophils Relative %: 46 % (ref 43–77)
Platelets: 272 10*3/uL (ref 150–400)
RBC: 4.18 MIL/uL (ref 3.87–5.11)
WBC: 4 10*3/uL (ref 4.0–10.5)

## 2013-01-23 NOTE — Progress Notes (Signed)
Quick Note:  WBC improved, normal (although on low range of normal). Recheck 3 months. ______

## 2013-01-24 ENCOUNTER — Other Ambulatory Visit: Payer: Self-pay

## 2013-01-24 ENCOUNTER — Telehealth: Payer: Self-pay | Admitting: Gastroenterology

## 2013-01-24 DIAGNOSIS — D72819 Decreased white blood cell count, unspecified: Secondary | ICD-10-CM

## 2013-01-24 NOTE — Progress Notes (Signed)
Quick Note:  Called and informed pt's mom. ______

## 2013-01-24 NOTE — Telephone Encounter (Signed)
Pt's mother has already been informed of CBC results; for some reason in the computer it is listed as resulted in Dec 2013. However, labs were resulted late Feb 2014.   Plans to recheck this in 3 months. Pt also called back and was informed by our office.   Lab orders on file for May 2014.

## 2013-03-01 ENCOUNTER — Ambulatory Visit (INDEPENDENT_AMBULATORY_CARE_PROVIDER_SITE_OTHER): Payer: Medicaid Other | Admitting: Physician Assistant

## 2013-03-01 ENCOUNTER — Encounter: Payer: Self-pay | Admitting: Physician Assistant

## 2013-03-01 ENCOUNTER — Telehealth: Payer: Self-pay | Admitting: Nurse Practitioner

## 2013-03-01 VITALS — BP 107/71 | HR 86 | Temp 98.7°F | Ht 64.0 in | Wt 120.2 lb

## 2013-03-01 DIAGNOSIS — B009 Herpesviral infection, unspecified: Secondary | ICD-10-CM

## 2013-03-01 DIAGNOSIS — B001 Herpesviral vesicular dermatitis: Secondary | ICD-10-CM

## 2013-03-01 MED ORDER — VALACYCLOVIR HCL 1 G PO TABS: 1000.0000 mg | ORAL_TABLET | Freq: Two times a day (BID) | ORAL | Status: DC

## 2013-03-01 NOTE — Telephone Encounter (Signed)
appt made

## 2013-03-01 NOTE — Progress Notes (Signed)
  Subjective:    Patient ID: Kendra Franklin, female    DOB: 25-Jul-1991, 22 y.o.   MRN: [id number]  HPI Lesions on lips 4-5 days    Review of Systems  Constitutional: Positive for fatigue.  All other systems reviewed and are negative.       Objective:   Physical Exam  Vitals reviewed. Constitutional: She appears well-developed and well-nourished.  Skin:  Cluster of vesicles lower lip, within the vermillion border          Assessment & Plan:  Cold sore - Plan: valACYclovir (VALTREX) 1000 MG tablet

## 2013-03-11 ENCOUNTER — Encounter: Payer: Self-pay | Admitting: Gastroenterology

## 2013-03-20 ENCOUNTER — Telehealth: Payer: Self-pay | Admitting: Gastroenterology

## 2013-03-20 NOTE — Telephone Encounter (Signed)
Pt is aware of OV on 5/8 at 1130 with SF

## 2013-03-20 NOTE — Telephone Encounter (Signed)
Pt called to make her FU OV with SF in E30. She is on the April recall list to follow up. Please advise a day and time I can bring patient in to see SF in E30. SF next available will be June 4. [phone]

## 2013-03-20 NOTE — Telephone Encounter (Signed)
May 8 at 1130

## 2013-04-04 ENCOUNTER — Encounter: Payer: Self-pay | Admitting: Gastroenterology

## 2013-04-04 ENCOUNTER — Ambulatory Visit (INDEPENDENT_AMBULATORY_CARE_PROVIDER_SITE_OTHER): Payer: Medicaid Other | Admitting: Gastroenterology

## 2013-04-04 VITALS — BP 110/64 | HR 113 | Temp 97.4°F | Ht 61.0 in | Wt 120.6 lb

## 2013-04-04 DIAGNOSIS — K5090 Crohn's disease, unspecified, without complications: Secondary | ICD-10-CM

## 2013-04-04 MED ORDER — AZATHIOPRINE 100 MG PO TABS: 100.0000 mg | ORAL_TABLET | Freq: Every day | ORAL | Status: DC

## 2013-04-04 NOTE — Patient Instructions (Addendum)
CONTINUE IMURAN AND REMICADE.  YOU NEED LABS DRAWN.  FOLLOW UP IN EARLY AUG 2014.

## 2013-04-04 NOTE — Progress Notes (Signed)
Cc PCP 

## 2013-04-04 NOTE — Progress Notes (Signed)
  Subjective:    Patient ID: Kendra Franklin, female    DOB: [birth year], 22 y.o.   MRN: [id number]  PCP: MOORE  HPI ON REMICADE/IMURAN. NEED INFUSION NEXT WEEK. WHEN SHE USES THE BR(STOOL) HAS MUCOUS. NO RECTAL BLEEDING. IF HAS A BM MORE THAN 2X/DAY WILL HAVE RECTAL PAIN. HASN'T USED MIRALAX IN A LONG TME. IT CAN MAKE HER HAVE DIARRHEA. JUST GRADUATED JUNIOR COLLEGE(4.0). [place]ENDING [employer] [place]. PT DENIES FEVER, CHILLS, BRBPR, nausea, vomiting, melena, constipation, abd pain, problems swallowing,  heartburn or indigestion.  Past Medical History  Diagnosis Date  . Anal fistula   . BMI between 19-24,adult 2011 116 LBS  . Crohn's JULY 2008    DEC 2011 ASCA 100 pANCA NEG-APR 2012 6-TGN 233(LLN 230)  6-MMPN 1173 ON 75 MG IMURAN   Past Surgical History  Procedure Laterality Date  . Incision and drainage perirectal abscess    . Givens capsule study  DEC 2011    SB ULCERS  . Colonoscopy  DEC 2011    ILEO-COLONIC ULCERS    Allergies  Allergen Reactions  . Morphine     Current Outpatient Prescriptions  Medication Sig Dispense Refill  . azaTHIOprine (IMURAN) 50 MG tablet TAKE TWO TABLETS BY MOUTH EVERY DAY    . inFLIXimab (REMICADE) 100 MG injection Inject 300 mg into the vein every 8 (eight) weeks. LAST DOSE EARLY APR   . medroxyPROGESTERone (DEPO-SUBQ PROVERA) 104 MG/0.65ML injection Inject 104 mg into the skin every 3 (three) months.   LAST DOSE MAR 2014   . Polyethylene Glycol 3350 (MIRALAX PO) Take 17 g by mouth    AS NEEDED   . valACYclovir (VALTREX) 1000 MG tablet Take 1 tablet (1,000 mg total) by mouth 2 (two) times daily.        Review of Systems     Objective:   Physical Exam  Vitals reviewed. Constitutional: She is oriented to person, place, and time. She appears well-nourished. No distress.  HENT:  Head: Normocephalic and atraumatic.  Mouth/Throat: Oropharynx is clear and moist. No oropharyngeal exudate.  Eyes: Pupils are equal, round, and reactive to  light. No scleral icterus.  Neck: Normal range of motion. Neck supple.  Cardiovascular: Normal rate, regular rhythm and normal heart sounds.   Pulmonary/Chest: Effort normal and breath sounds normal. No respiratory distress.  Abdominal: Soft. Bowel sounds are normal. She exhibits no distension. There is no tenderness.  Musculoskeletal: She exhibits no edema.  Lymphadenopathy:    She has no cervical adenopathy.  Neurological: She is alert and oriented to person, place, and time.  NO FOCAL DEFICITS   Psychiatric: She has a normal mood and affect.          Assessment & Plan:

## 2013-04-04 NOTE — Assessment & Plan Note (Signed)
SX CONTROLLED.  CONTINUE IMURAN AND REMICADE. OPV IN EARLY AUG 2014 AND DEC 2014.

## 2013-04-04 NOTE — Progress Notes (Signed)
Reminder in epic °

## 2013-04-10 ENCOUNTER — Encounter (HOSPITAL_COMMUNITY): Payer: Medicaid Other | Attending: Gastroenterology

## 2013-04-10 VITALS — BP 116/70 | HR 102 | Temp 98.5°F | Resp 18

## 2013-04-10 DIAGNOSIS — K5090 Crohn's disease, unspecified, without complications: Secondary | ICD-10-CM

## 2013-04-10 MED ORDER — SODIUM CHLORIDE 0.9 % IV SOLN
INTRAVENOUS | Status: DC
  Administered 2013-04-10: 11:00:00 via INTRAVENOUS

## 2013-04-10 MED ORDER — INFLIXIMAB 100 MG IV SOLR
300.0000 mg | Freq: Once | INTRAVENOUS | Status: AC
Start: 2013-04-10 — End: 2013-04-10
  Administered 2013-04-10: 300 mg via INTRAVENOUS
  Filled 2013-04-10: qty 30

## 2013-04-10 MED ORDER — SODIUM CHLORIDE 0.9 % IJ SOLN
10.0000 mL | INTRAMUSCULAR | Status: DC | PRN
  Administered 2013-04-10: 10 mL via INTRAVENOUS

## 2013-04-10 NOTE — Progress Notes (Signed)
Tolerated well

## 2013-04-11 LAB — CBC WITH DIFFERENTIAL/PLATELET
Basophils Absolute: 0 10*3/uL (ref 0.0–0.1)
Basophils Relative: 1 % (ref 0–1)
Eosinophils Absolute: 0.1 10*3/uL (ref 0.0–0.7)
Eosinophils Relative: 2 % (ref 0–5)
HCT: 40.8 % (ref 36.0–46.0)
MCH: 31.1 pg (ref 26.0–34.0)
MCHC: 34.8 g/dL (ref 30.0–36.0)
Monocytes Absolute: 0.3 10*3/uL (ref 0.1–1.0)
Monocytes Relative: 8 % (ref 3–12)
Neutro Abs: 2.2 10*3/uL (ref 1.7–7.7)
RDW: 13.6 % (ref 11.5–15.5)

## 2013-04-11 LAB — COMPLETE METABOLIC PANEL WITH GFR
ALT: <8 U/L (ref 0–35)
Albumin: 4.8 g/dL (ref 3.5–5.2)
Alkaline Phosphatase: 75 U/L (ref 39–117)
CO2: 21 mEq/L (ref 19–32)
GFR, Est Non African American: >89 mL/min
Glucose, Bld: 94 mg/dL (ref 70–99)
Potassium: 4 mEq/L (ref 3.5–5.3)
Sodium: 139 mEq/L (ref 135–145)
Total Protein: 8 g/dL (ref 6.0–8.3)

## 2013-04-25 NOTE — Progress Notes (Signed)
PLEASE CALL PT.  HER BLOOD COUNT AND LIVER ENZYMES  ARE NORMAL.

## 2013-04-29 NOTE — Progress Notes (Signed)
Called and informed pt.  

## 2013-06-05 ENCOUNTER — Other Ambulatory Visit: Payer: Self-pay

## 2013-06-05 ENCOUNTER — Encounter (HOSPITAL_COMMUNITY): Payer: Medicaid Other | Attending: Gastroenterology

## 2013-06-05 VITALS — BP 105/60 | HR 89 | Temp 99.2°F | Resp 16

## 2013-06-05 DIAGNOSIS — K5090 Crohn's disease, unspecified, without complications: Secondary | ICD-10-CM | POA: Insufficient documentation

## 2013-06-05 MED ORDER — DIPHENHYDRAMINE HCL 25 MG PO CAPS
ORAL_CAPSULE | ORAL | Status: AC
  Filled 2013-06-05: qty 1

## 2013-06-05 MED ORDER — ACETAMINOPHEN 325 MG PO TABS
650.0000 mg | ORAL_TABLET | Freq: Once | ORAL | Status: AC
  Administered 2013-06-05: 650 mg via ORAL

## 2013-06-05 MED ORDER — SODIUM CHLORIDE 0.9 % IV SOLN
300.0000 mg | Freq: Once | INTRAVENOUS | Status: AC
  Administered 2013-06-05: 300 mg via INTRAVENOUS
  Filled 2013-06-05: qty 30

## 2013-06-05 MED ORDER — ACETAMINOPHEN 325 MG PO TABS
ORAL_TABLET | ORAL | Status: AC
  Filled 2013-06-05: qty 2

## 2013-06-05 MED ORDER — SODIUM CHLORIDE 0.9 % IV SOLN
INTRAVENOUS | Status: DC
  Administered 2013-06-05: 500 mL via INTRAVENOUS

## 2013-06-05 MED ORDER — DIPHENHYDRAMINE HCL 25 MG PO CAPS
25.0000 mg | ORAL_CAPSULE | Freq: Once | ORAL | Status: AC
  Administered 2013-06-05: 25 mg via ORAL

## 2013-06-05 NOTE — Telephone Encounter (Signed)
PT NEEDS RENEWAL OF REMICADE. REFILL X1 YEAR.

## 2013-06-05 NOTE — Progress Notes (Signed)
Tolerated well

## 2013-06-06 NOTE — Telephone Encounter (Signed)
Ok great.

## 2013-06-06 NOTE — Telephone Encounter (Signed)
[name] filled out order yesterday and [facility]

## 2013-06-06 NOTE — Telephone Encounter (Signed)
How do we order this?  Needs Remicade 300 mg IV every 8 weeks.

## 2013-06-13 ENCOUNTER — Telehealth: Payer: Self-pay

## 2013-06-13 NOTE — Telephone Encounter (Signed)
PLEASE CALL PT. IT SOUNDS LIKE SHE HAD AN ALLERGIC REACTION. REMICADE SHOULDN'T DO THAT. SHE'S BEEN ON IT AWHILE. SHE SHOULD USE THE MED RX AND SEE DERMATOLOGY IF RASH DOESN'T GO AWAY.

## 2013-06-13 NOTE — Telephone Encounter (Signed)
Called and informed pt.  

## 2013-06-13 NOTE — Telephone Encounter (Addendum)
Pt called and said her lip was swollen this morning and had a rash. It was not painful, just irritated. She is in [place] and went to an Urgent Care . She was prescribed a medication Aphthasol but she has not picked it up yet. She said she was told that the lip problem was related to Crohn's. She wants [name] to know and see if she thinks she should use the medication. She is concerned about another medication. Please advise!

## 2013-06-26 ENCOUNTER — Telehealth: Payer: Self-pay | Admitting: Gastroenterology

## 2013-06-26 NOTE — Telephone Encounter (Signed)
Called pt and she was driving and she asked me to call back and leave the message in her VM and I did so.

## 2013-06-26 NOTE — Telephone Encounter (Signed)
Dr. Terrance Mass would like to speak to you regarding the Gastro Dr. That she can follow up with in [place], please advise

## 2013-06-26 NOTE — Telephone Encounter (Signed)
Done

## 2013-06-26 NOTE — Telephone Encounter (Signed)
PLEASE CALL PT. THE GI DOCs NAME IS [place] [facility] [phone].

## 2013-06-27 ENCOUNTER — Encounter: Payer: Self-pay | Admitting: Gastroenterology

## 2013-06-28 NOTE — Telephone Encounter (Signed)
Fax records to Dr. Belva Agee in [place]. PLEASE CALL PT. LET HER KNOW WE SENT THE RECORDS.

## 2013-07-02 NOTE — Telephone Encounter (Signed)
We possible need a signed release forwarding to [facility] for medical records release

## 2013-07-10 ENCOUNTER — Telehealth: Payer: Self-pay | Admitting: Gastroenterology

## 2013-07-10 ENCOUNTER — Telehealth: Payer: Self-pay | Admitting: Family Medicine

## 2013-07-10 MED ORDER — POLYETHYLENE GLYCOL 3350 17 GM/SCOOP PO POWD: 17.0000 g | Freq: Every day | ORAL | Status: DC | PRN

## 2013-07-10 NOTE — Telephone Encounter (Signed)
Pt is aware that there will be a $29 charge for SF filling out papers.

## 2013-07-10 NOTE — Telephone Encounter (Signed)
RX sent

## 2013-07-10 NOTE — Telephone Encounter (Signed)
PLEASE CALL PT. Rx sent. SHE MAY PICK UP FORMS TOMORROW, BUT WE NEED $29 TO FILL IT OUT.

## 2013-07-10 NOTE — Telephone Encounter (Signed)
Pt needs a rx for Miralax sent to [facility] in [facility].. She is also checking to see if SF has been able to fill out her papers for school. Please asdvise [phone]

## 2013-07-10 NOTE — Addendum Note (Signed)
Addended by: Danie Binder on: 07/10/2013 03:30 PM   Modules accepted: Orders

## 2013-07-11 ENCOUNTER — Other Ambulatory Visit: Payer: Self-pay | Admitting: Family Medicine

## 2013-07-11 MED ORDER — POLYETHYLENE GLYCOL 3350 17 GM/SCOOP PO POWD: 17.0000 g | Freq: Two times a day (BID) | ORAL | Status: DC | PRN

## 2013-07-11 NOTE — Telephone Encounter (Signed)
Forms were completed and faxed to [phone]. Pt came by to pick up original. Copy will be scanned in.

## 2013-07-17 ENCOUNTER — Telehealth: Payer: Self-pay | Admitting: Gastroenterology

## 2013-07-17 NOTE — Telephone Encounter (Signed)
Routing to [name].

## 2013-07-17 NOTE — Telephone Encounter (Signed)
Mother is calling to see if [name] will fill out a form for Kendra Franklin to get a handicap sign for her car so she dont have so far to walk on campus. Shes having trouble with her legs when she walks so far, please advise?

## 2013-07-18 NOTE — Telephone Encounter (Signed)
HANDICAP FORM FILLED OUT.

## 2013-07-24 ENCOUNTER — Other Ambulatory Visit: Payer: Self-pay

## 2013-07-24 MED ORDER — AZATHIOPRINE 100 MG PO TABS: 100.0000 mg | ORAL_TABLET | Freq: Every day | ORAL | Status: DC

## 2013-07-31 ENCOUNTER — Ambulatory Visit: Payer: Medicaid Other | Admitting: Gastroenterology

## 2013-08-06 ENCOUNTER — Ambulatory Visit (HOSPITAL_COMMUNITY): Payer: Medicaid Other

## 2013-08-06 ENCOUNTER — Encounter (HOSPITAL_COMMUNITY)
Admission: RE | Admit: 2013-08-06 | Discharge: 2013-08-06 | Disposition: A | Discharge status: Home / Self Care | Payer: Medicaid Other | Source: Ambulatory Visit | Attending: Gastroenterology | Admitting: Gastroenterology

## 2013-08-06 ENCOUNTER — Telehealth: Payer: Self-pay | Admitting: General Practice

## 2013-08-06 NOTE — Telephone Encounter (Signed)
I received a call from the [place] at [facility] and she stated Kendra Franklin didn't show up for her injections today.  She called the patient and she stated she would like [name] to refer her to a clinic in [place] to have her shots.  She is now living in [place] on the campus of [facility].  Please advise on referral?

## 2013-08-06 NOTE — Telephone Encounter (Signed)
That's already been done!! Her records have been sent to [facility] already

## 2013-08-09 ENCOUNTER — Telehealth: Payer: Self-pay | Admitting: Gastroenterology

## 2013-08-09 NOTE — Telephone Encounter (Signed)
Pt called this morning asking if we were able to start sending her Remicaid to [place] where she is at. I told her SF and nurse were not here today and I would send message and someone will call her to let her know. [phone]

## 2013-08-12 NOTE — Telephone Encounter (Signed)
Pt called. I told her info has been faxed to Dr. Vito Backers Minor at [facility] phone number is [phone]. I called and spoke to [facility] at that office and she said no appt has been made for this pt, they do not have her in the system. Pt is going to call and see what they tell her.

## 2013-08-13 NOTE — Telephone Encounter (Signed)
REVIEWED.  

## 2013-08-16 NOTE — Telephone Encounter (Signed)
CALLED AND SPOKE WITH DR. MINOR. SHE WILL FACILITATE AN APPT SO PT CAN GET REMICADE. EXPLAINED BRIEFLY PT'S CLINICAL COURSE. GAVE DR. MINOR Annalissa'S PH#, AND MY CELL AND OFC #: [id number].

## 2013-08-18 NOTE — Telephone Encounter (Signed)
PT HAS AN APPT SEP 22.

## 2013-08-26 ENCOUNTER — Telehealth: Payer: Self-pay

## 2013-08-26 NOTE — Telephone Encounter (Signed)
PT called and said she has not heard from Dr. Belva Agee about scheduling an appt for setting up Remicade. I told her [name] has spoke with Dr. Belva Agee. The only phone number that I am showing is [phone]. She will try that and will call back if she does not get any response.

## 2013-08-26 NOTE — Telephone Encounter (Signed)
Kendra Franklin called back. She said she called and they said they have not received anything on pt and so therefore she cannot schedule appt. Pt would like for [name] to see if she can make the appt for her and get it on a Tues or Thurs after 10:00 AM.

## 2013-08-27 NOTE — Telephone Encounter (Signed)
Pt called and said that she will be on school break next week and would be able to do the Remicade treatment at [facility] on Mon or Tues next week if [name] wanted her to. She has not heard anything from Dr. Belva Agee please advise!

## 2013-08-27 NOTE — Telephone Encounter (Signed)
Pt's mom called and said she just wants to make sure that Kendra Franklin can get her Remicade soon, she is past due. I told her I have sent the info to [name] and she has had a busy day at the hospital, but she will be in the office tomorrow and I will let them know what she says.

## 2013-08-28 ENCOUNTER — Telehealth: Payer: Self-pay | Admitting: Gastroenterology

## 2013-08-28 NOTE — Telephone Encounter (Signed)
Pt's mother called to see if SF has addressed the remicaid situation. I told her that SF was aware, but has been busy today in the office and the nurse was sending her another reminder. Please call either [phone] or [phone]

## 2013-08-28 NOTE — Telephone Encounter (Signed)
Per [name], pt's mom called and wanted to know what Dr. Nona Dell is doing about pt's Remicade.

## 2013-08-28 NOTE — Telephone Encounter (Signed)
PT SHOULD COME HOME TO GET REMICADE.

## 2013-08-29 NOTE — Telephone Encounter (Signed)
Tried to call pt. Got VM, but it is full and I could not leave a message.   Per Petra Kuba at [facility]  Pt needs to be at [facility] at 9:00 AM on Mon.  [name], please fill out the papers for new orders and I will fax to [facility] at [phone].

## 2013-08-29 NOTE — Telephone Encounter (Signed)
CALLED PT. LVM-REMICADE OCT 6. REFERRED TO DR. ALAN CHEMPRABHA FOR CONTINUED CARE.

## 2013-08-29 NOTE — Telephone Encounter (Signed)
REVIEWED.  

## 2013-09-02 ENCOUNTER — Encounter (HOSPITAL_COMMUNITY)
Admission: RE | Admit: 2013-09-02 | Discharge: 2013-09-02 | Disposition: A | Discharge status: Home / Self Care | Payer: Medicaid Other | Source: Ambulatory Visit | Attending: Gastroenterology | Admitting: Gastroenterology

## 2013-09-02 DIAGNOSIS — K5090 Crohn's disease, unspecified, without complications: Secondary | ICD-10-CM | POA: Insufficient documentation

## 2013-09-02 MED ORDER — ACETAMINOPHEN 325 MG PO TABS
650.0000 mg | ORAL_TABLET | Freq: Four times a day (QID) | ORAL | Status: DC | PRN
  Administered 2013-09-02: 650 mg via ORAL

## 2013-09-02 MED ORDER — LORATADINE 10 MG PO TABS
ORAL_TABLET | ORAL | Status: AC
  Filled 2013-09-02: qty 1

## 2013-09-02 MED ORDER — SODIUM CHLORIDE 0.9 % IV SOLN
INTRAVENOUS | Status: DC
  Administered 2013-09-02: 09:00:00 via INTRAVENOUS

## 2013-09-02 MED ORDER — ACETAMINOPHEN 325 MG PO TABS
ORAL_TABLET | ORAL | Status: AC
  Filled 2013-09-02: qty 2

## 2013-09-02 MED ORDER — LORATADINE 10 MG PO TABS
10.0000 mg | ORAL_TABLET | Freq: Every day | ORAL | Status: DC
  Administered 2013-09-02: 10 mg via ORAL

## 2013-09-02 MED ORDER — SODIUM CHLORIDE 0.9 % IV SOLN
300.0000 mg | Freq: Once | INTRAVENOUS | Status: AC
  Administered 2013-09-02: 300 mg via INTRAVENOUS
  Filled 2013-09-02: qty 30

## 2013-09-12 ENCOUNTER — Other Ambulatory Visit: Payer: Self-pay

## 2013-09-12 MED ORDER — POLYETHYLENE GLYCOL 3350 17 GM/SCOOP PO POWD: 17.0000 g | Freq: Every day | ORAL | Status: DC | PRN

## 2013-11-22 ENCOUNTER — Encounter: Payer: Self-pay | Admitting: Gastroenterology

## 2013-12-02 MED ORDER — POLYETHYLENE GLYCOL 3350 17 GM/SCOOP PO POWD: 17.0000 g | Freq: Two times a day (BID) | ORAL | Status: DC | PRN

## 2013-12-02 NOTE — Telephone Encounter (Signed)
PT NEEDS LARGER QUANTITY OF MIRALAX. RX SENT.

## 2013-12-02 NOTE — Addendum Note (Signed)
Addended by: Danie Binder on: 12/02/2013 10:24 AM   Modules accepted: Orders, Medications

## 2013-12-10 ENCOUNTER — Telehealth: Payer: Self-pay | Admitting: *Deleted

## 2013-12-10 NOTE — Telephone Encounter (Signed)
Pt's mother is aware

## 2013-12-10 NOTE — Telephone Encounter (Signed)
Pt's mother called stating pt is not feeling well at all, pt has real bad pain in her right butt bone that runs all the way down to her leg, and pt is having leakage. Please advise [phone]

## 2013-12-10 NOTE — Telephone Encounter (Signed)
She can not get in to see him until January 28 and her mother said that is to long. Please advise

## 2013-12-10 NOTE — Telephone Encounter (Signed)
PLEASE CALL PT's mother. Bring her tO OFC JAN 14 AT 11 AM.

## 2013-12-10 NOTE — Telephone Encounter (Signed)
PLEASE CALL PT. SHE SHOULD CALL AND GET AN APPT WITH DR. Genene Churn.

## 2013-12-11 ENCOUNTER — Other Ambulatory Visit: Payer: Self-pay

## 2013-12-11 ENCOUNTER — Encounter (HOSPITAL_COMMUNITY)
Admission: RE | Disposition: A | Discharge status: Home / Self Care | Payer: Self-pay | Source: Ambulatory Visit | Attending: Gastroenterology

## 2013-12-11 ENCOUNTER — Encounter (INDEPENDENT_AMBULATORY_CARE_PROVIDER_SITE_OTHER): Payer: Self-pay

## 2013-12-11 ENCOUNTER — Ambulatory Visit (HOSPITAL_COMMUNITY)
Admission: RE | Admit: 2013-12-11 | Discharge: 2013-12-11 | Disposition: A | Discharge status: Home / Self Care | Payer: Medicaid Other | Source: Ambulatory Visit | Attending: Gastroenterology | Admitting: Gastroenterology

## 2013-12-11 ENCOUNTER — Ambulatory Visit (HOSPITAL_COMMUNITY): Payer: Medicaid Other

## 2013-12-11 ENCOUNTER — Encounter: Payer: Self-pay | Admitting: Gastroenterology

## 2013-12-11 ENCOUNTER — Encounter (HOSPITAL_COMMUNITY): Payer: Self-pay | Admitting: *Deleted

## 2013-12-11 ENCOUNTER — Ambulatory Visit (INDEPENDENT_AMBULATORY_CARE_PROVIDER_SITE_OTHER): Payer: Medicaid Other | Admitting: Gastroenterology

## 2013-12-11 VITALS — BP 123/79 | HR 98 | Temp 97.8°F | Wt 125.0 lb

## 2013-12-11 DIAGNOSIS — K639 Disease of intestine, unspecified: Secondary | ICD-10-CM

## 2013-12-11 DIAGNOSIS — K648 Other hemorrhoids: Secondary | ICD-10-CM | POA: Insufficient documentation

## 2013-12-11 DIAGNOSIS — K6289 Other specified diseases of anus and rectum: Secondary | ICD-10-CM | POA: Insufficient documentation

## 2013-12-11 DIAGNOSIS — K5090 Crohn's disease, unspecified, without complications: Secondary | ICD-10-CM

## 2013-12-11 DIAGNOSIS — IMO0001 Reserved for inherently not codable concepts without codable children: Secondary | ICD-10-CM

## 2013-12-11 HISTORY — PX: FLEXIBLE SIGMOIDOSCOPY: SHX5431

## 2013-12-11 LAB — CREATININE, SERUM: Creatinine, Ser: 0.64 mg/dL (ref 0.50–1.10)

## 2013-12-11 SURGERY — SIGMOIDOSCOPY, FLEXIBLE
Anesthesia: Moderate Sedation

## 2013-12-11 MED ORDER — MIDAZOLAM HCL 5 MG/5ML IJ SOLN
INTRAMUSCULAR | Status: AC
  Filled 2013-12-11: qty 10

## 2013-12-11 MED ORDER — MEPERIDINE HCL 100 MG/ML IJ SOLN
INTRAMUSCULAR | Status: DC | PRN
  Administered 2013-12-11 (×2): 25 mg via INTRAVENOUS

## 2013-12-11 MED ORDER — MEPERIDINE HCL 100 MG/ML IJ SOLN
INTRAMUSCULAR | Status: AC
  Filled 2013-12-11: qty 2

## 2013-12-11 MED ORDER — MIDAZOLAM HCL 5 MG/5ML IJ SOLN
INTRAMUSCULAR | Status: DC | PRN
  Administered 2013-12-11: 2 mg via INTRAVENOUS
  Administered 2013-12-11: 1 mg via INTRAVENOUS
  Administered 2013-12-11: 2 mg via INTRAVENOUS

## 2013-12-11 MED ORDER — SIMETHICONE 40 MG/0.6ML PO SUSP
ORAL | Status: DC | PRN
  Administered 2013-12-11: 14:00:00

## 2013-12-11 MED ORDER — SODIUM CHLORIDE 0.9 % IV SOLN
INTRAVENOUS | Status: DC
Start: 2013-12-11 — End: 2013-12-11
  Administered 2013-12-11: 13:00:00 via INTRAVENOUS

## 2013-12-11 MED ORDER — IOHEXOL 300 MG/ML  SOLN
100.0000 mL | Freq: Once | INTRAMUSCULAR | Status: AC | PRN
  Administered 2013-12-11: 100 mL via INTRAVENOUS

## 2013-12-11 NOTE — Patient Instructions (Signed)
FLEX SIG TODAY.

## 2013-12-11 NOTE — Discharge Instructions (Signed)
CT SCAN TODAY.  ENDOSCOPY Care After Read the instructions outlined below and refer to this sheet in the next week. These discharge instructions provide you with general information on caring for yourself after you leave the hospital. While your treatment has been planned according to the most current medical practices available, unavoidable complications occasionally occur. If you have any problems or questions after discharge, call DR. Patrizia Paule, [phone].  ACTIVITY  You may resume your regular activity, but move at a slower pace for the next 24 hours.   Take frequent rest periods for the next 24 hours.   Walking will help get rid of the air and reduce the bloated feeling in your belly (abdomen).   No driving for 24 hours (because of the medicine (anesthesia) used during the test).   You may shower.   Do not sign any important legal documents or operate any machinery for 24 hours (because of the anesthesia used during the test).    NUTRITION  Drink plenty of fluids.   You may resume your normal diet as instructed by your doctor.   Begin with a light meal and progress to your normal diet. Heavy or fried foods are harder to digest and may make you feel sick to your stomach (nauseated).   Avoid alcoholic beverages for 24 hours or as instructed.    MEDICATIONS  You may resume your normal medications.   WHAT YOU CAN EXPECT TODAY  Some feelings of bloating in the abdomen.   Passage of more gas than usual.   Spotting of blood in your stool or on the toilet paper  .  IF YOU HADBIOPSIES TAKEN  DURING THE SIGMOIDOSCOPY/UPPER ENDOSCOPY:  Eat a soft diet IF YOU HAVE NAUSEA, BLOATING, ABDOMINAL PAIN, OR VOMITING.    FINDING OUT THE RESULTS OF YOUR TEST Not all test results are available during your visit. [name] WILL CALL YOU WITHIN 7 DAYS OF YOUR PROCEDUE WITH YOUR RESULTS. Do not assume everything is normal if you have not heard from DR. Latiqua Daloia IN ONE WEEK, CALL HER  OFFICE AT [phone].  SEEK IMMEDIATE MEDICAL ATTENTION AND CALL THE OFFICE: [phone] IF:  You have more than a spotting of blood in your stool.   Your belly is swollen (abdominal distention).   You are nauseated or vomiting.   You have a temperature over 101F.   You have abdominal pain or discomfort that is severe or gets worse throughout the day.

## 2013-12-11 NOTE — Op Note (Signed)
[place], [address]   FLEX SIGMOIDOSCOPY PROCEDURE REPORT  PATIENT: Kendra Franklin, Kendra Franklin  MR#: [id number] BIRTHDATE: 03/16/1991 , 22  yrs. old GENDER: Female ENDOSCOPIST: Barney Drain, MD REFERRED XB:WIOMBT Laurance Flatten, M.D. 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 LAST TCS 2014: NL MEDICATIONS: Demerol 50 mg IV and Versed 5 mg IV  DESCRIPTION OF PROCEDURE:    Physical exam was performed.  Informed consent was obtained from the patient after explaining the benefits, risks, and alternatives to procedure.  The patient was connected to monitor and placed in left lateral position. Continuous oxygen was provided by nasal cannula and IV medicine administered through an indwelling cannula.  After administration of sedation and rectal exam, the patients rectum was intubated and the EC-[id number] ([id number])  colonoscope was advanced under direct visualization to the [place]. The scope was removed slowly by carefully examining the color, texture, anatomy, and integrity mucosa on the way out.  The patient was recovered in endoscopy and discharged home in satisfactory condition.    COLON FINDINGS: The colonic mucosa appeared normal.  , PROXIMAL AND DISTAL RECTAL WALL MUCOSA NL.  BULGE DETECTED IN THE LUMEN TO SUGGEST PERI-RECTAL PROCESS, and Small internal hemorrhoids were found.  PREP QUALITY: adEQUATE    COMPLICATIONS: None  ENDOSCOPIC IMPRESSION: 1.   The colonic mucosa appeared normal 2.   PROBABLE PERIRECTAL ABSCESS 3.   Small internal hemorrhoids  RECOMMENDATIONS: CT PELVIS W/ IVC TODAY CONSIDER CTE IF NO ABSCESS OR CELLULITIS DETECTED ON CT       _______________________________ eSignedBarney Drain, MD 12/11/2013 3:19 PM

## 2013-12-11 NOTE — Progress Notes (Signed)
cc'd to pcp 

## 2013-12-11 NOTE — Progress Notes (Signed)
Subjective:    Patient ID: Kendra Franklin, female    DOB: Jun 12, 1991, 23 y.o.   MRN: [id number]  [name], MD  HPI Right butt cheek pain TO R LEG for past mo. ASSOCIATED WITH DRAINAGE AFTER BMs AND PASSING GAS. STARTED ABX 2 DAYS AGO BUT THEY MAKE HER SICK. SX: PAIN GETTING WORSE, DRAINAGE WORSE. WHEN IT STARTED IT WAS AT A 3 AND THEN IT JUMPED TO A 10/10 FOR PAST WEEK. WAS ON AND OFF BUT SINCE SUN IT'S CONSTANT. NAUSEA STARTED AFTER ABX(FLAGYL). NO DIARRHEA, CHEST PAIN/SOB,  OR ABD PAIN. NO REDNESS IN BUTT CHECK. NO PAIN IN ANUS WHEN STOOL PASSES. BMs: 1X/DAY(#4-5). SLIMY DISCHARGE [place]. ASSOCIATED WITH A #5 AND ONLY WHEN SHE HAS A BM. SEEMS LIKE IT'S MORE THAN 1 MO AGO. NOT PASSING STOOL IN URINE OR AIR WHEN SHE URINATES. NOT PASSING GAS/STOOL THRU VAGINA.   PT DENIES FEVER, CHILLS, BRBPR, vomiting, melena, diarrhea, constipation, abd pain, problems swallowing, problems with sedation, OR heartburn or indigestion.   Past Medical History  Diagnosis Date  . Anal fistula   . BMI between 19-24,adult 2011 116 LBS  . Crohn's JULY 2008    DEC 2011 ASCA 100 pANCA NEG-APR 2012 6-TGN 233(LLN 230)  6-MMPN 1173 ON 75 MG IMURAN   Past Surgical History  Procedure Laterality Date  . Incision and drainage perirectal abscess    . Givens capsule study  DEC 2011    SB ULCERS  . Colonoscopy  DEC 2011    ILEO-COLONIC ULCERS   Allergies  Allergen Reactions  . Morphine    Current Outpatient Prescriptions  Medication Sig Dispense Refill  . azathioprine (IMURAN) 100 MG tablet Take 1 tablet (100 mg total) by mouth daily.    . ciprofloxacin (CIPRO) 500 MG tablet Take 500 mg by mouth 2 (two) times daily.    Marland Kitchen inFLIXimab (REMICADE) 100 MG injection Inject 300 mg into the vein every 8 (eight) weeks.    . medroxyPROGESTERone (DEPO-SUBQ PROVERA) 104 MG/0.65ML injection Inject 104 mg into the skin every 3 (three) months.    LAST SHOT NOV 2014    . metroNIDAZOLE (FLAGYL) 500 MG tablet Take 500 mg by  mouth 2 (two) times daily.      . polyethylene glycol powder (GLYCOLAX/MIRALAX) powder Take 17 g by mouth 2 (two) times daily as needed. VERY RARE   . valACYclovir (VALTREX) 1000 MG tablet Take 1 tablet (1,000 mg total) by mouth 2 (two) times daily.     Family History  Problem Relation Age of Onset  . Colon cancer Neg Hx   . Colon polyps Neg Hx   . Inflammatory bowel disease Neg Hx     History   Social History  . Marital Status: Single   Social History Main Topics  . Smoking status: Never Smoker   . Smokeless tobacco: Never Used  . Alcohol Use: No  . Drug Use: No  . Sexual Activity: No   Social History Narrative   Graduating this spring. Straight A student. Going to [employer]. Mother: Arlyss Repress. No siblings. Mother smokes.        Review of Systems PER HPI OTHERWISE ALL SYSTEMS ARE NEGATIVE.     Objective:   Physical Exam  Vitals reviewed. Constitutional: She is oriented to person, place, and time. She appears well-nourished.  LAYING ON STOMACH ON EXAM TABLE  HENT:  Head: Normocephalic and atraumatic.  Mouth/Throat: Oropharynx is clear and moist. No oropharyngeal exudate.  Eyes: Pupils are equal,  round, and reactive to light. No scleral icterus.  Neck: Normal range of motion. Neck supple.  Cardiovascular: Normal rate, regular rhythm and normal heart sounds.   Pulmonary/Chest: Effort normal and breath sounds normal. No respiratory distress.  Abdominal: Soft. Bowel sounds are normal. She exhibits no distension. There is no tenderness.  Genitourinary:  EXTERNAL EXAM OF PERINEUM: NL  Musculoskeletal: Normal range of motion. She exhibits no edema.  Lymphadenopathy:    She has no cervical adenopathy.  Neurological: She is alert and oriented to person, place, and time.  NO FOCAL DEFICITS   Skin:     TTP OVER R BUTTOCK,UNABLE TO APPRECIATE MASS, NO OVERLYING ERYTHEMA.  Psychiatric: She has a normal mood and affect.          Assessment & Plan:

## 2013-12-11 NOTE — Progress Notes (Signed)
Send OPV, FLEX SIG, AND CT REPORT TO DR. Jacona, [place], [place].

## 2013-12-11 NOTE — Assessment & Plan Note (Signed)
NOW WITH SUBACUTE ONSET OF R BUTTOCK PAIN ASSOCIATED WITH INCREASED RECTAL DRAINAGE. DIFFERENTIAL DIAGNOSIS INCLUDES FISTULA W/ ABCESS, AND LESS LIKELY CELLULITIS.  FLEX SIG AND/OR CT OF PELVIS/R BUTTOCK TODAY. CONSIDER CT ENTEROGRAPHY JAN 15. DISCUSSED WITH PT, GRANDMOTHER,A ND MOTHER. HOLD FLAG/CIPRO. MAY CHANGE TO AUGMENTIN. OPV IN 1 MO.

## 2013-12-12 ENCOUNTER — Ambulatory Visit (HOSPITAL_COMMUNITY)
Admission: RE | Admit: 2013-12-12 | Discharge: 2013-12-12 | Disposition: A | Discharge status: Home / Self Care | Payer: Medicaid Other | Source: Ambulatory Visit | Attending: Gastroenterology | Admitting: Gastroenterology

## 2013-12-12 DIAGNOSIS — M545 Low back pain, unspecified: Secondary | ICD-10-CM | POA: Insufficient documentation

## 2013-12-12 DIAGNOSIS — K5090 Crohn's disease, unspecified, without complications: Secondary | ICD-10-CM

## 2013-12-12 MED ORDER — GABAPENTIN 100 MG PO CAPS: 100.0000 mg | ORAL_CAPSULE | Freq: Three times a day (TID) | ORAL | Status: DC

## 2013-12-12 MED ORDER — HYDROCODONE-ACETAMINOPHEN 5-325 MG PO TABS: ORAL_TABLET | ORAL | Status: DC

## 2013-12-12 NOTE — Progress Notes (Signed)
OV 2/18 at 3 with SF and appt card was mailed

## 2013-12-12 NOTE — Progress Notes (Addendum)
Called pt mother jan 28. No abscess on report. I PERSONALLY REVIEWED CT WITH DR. Arelia Longest ABSCESS OR SOFT TISSUE ABNORMALITY, GYN EXAM/SI JOINTS WNLS. REVIEWED SIDE EFFECTS OF REMICADE AND IMURAN. 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 NEED LUMBOSACRAL SPINE FILMS AP/LATERAL. THEN WILL PROCEED WITH PT OR CHIROPRACTER EVALUATION, ICE PACK FOR 20 MINS TID FOR 3 DAYS THEN HEAT AS WELL AS STRETCHES. WILL AVOID NSAIDS DUE TO IBD. CONTINUE IMURAN/REMICADE. SEE CHIROPRACTOR JAN 15 IF SPINE FILMS CLEAR.

## 2013-12-12 NOTE — Addendum Note (Signed)
Addended by: Danie Binder on: 12/12/2013 12:55 PM   Modules accepted: Orders

## 2013-12-12 NOTE — Progress Notes (Signed)
Faxed all records to [facility]

## 2013-12-12 NOTE — Addendum Note (Signed)
Addended by: Danie Binder on: 12/12/2013 02:34 PM   Modules accepted: Orders

## 2013-12-13 ENCOUNTER — Encounter (HOSPITAL_COMMUNITY): Payer: Self-pay | Admitting: Gastroenterology

## 2013-12-25 NOTE — Progress Notes (Signed)
REVIEWED FILMS WITH RADIOLOGY. SPOKE WITH MOTHER JAN 15. AWARE SPINE FILMS NL. PROCEED WITH MS EVALUATION.

## 2014-01-08 ENCOUNTER — Telehealth: Payer: Self-pay

## 2014-01-08 NOTE — Telephone Encounter (Signed)
Pt called and said she has appt with [name] on Wed 01/15/2014. She wants to see her Mon 01/13/2014 anytime, early or anytime up to lunch. I told her [name] is at the hospital Mon. She asked could she see one of the extenders and I told her they are booked Mon also. I told her I would send the request to [name] and see if she has recommendations.

## 2014-01-09 NOTE — Telephone Encounter (Signed)
FEELING BETTER. TREATED FOR SCIATICA. CANCEL OPV FEB 18. PT WILL CALL WITH QUESTIONS OR CONCERNS.

## 2014-01-15 ENCOUNTER — Ambulatory Visit: Payer: Medicaid Other | Admitting: Gastroenterology

## 2014-02-17 ENCOUNTER — Telehealth: Payer: Self-pay

## 2014-02-17 NOTE — Telephone Encounter (Signed)
Pt called wanting to talk with SLF. She has some question for her. Please call her back at [phone]. Thanks

## 2014-02-18 NOTE — Telephone Encounter (Signed)
PT SEEN IN ED IN CLT ON WED. DIAGNOSED WITH KIDNEY STONES AT [facility] [place]. PMHx: ERXVQ'M DISEASE TREATED WITH REMICADE.PT SCHOOL EXCUSE EXPIRED. INSTRUCTED PT TO AVOID MEATS EXCEPT FISH. EGGS ARE OK. DRINK 2-3Ls FLUID DAILY. AVOID EXTRA SALT. CALLED [facility] APPT FOR AN 1130 AM. SPOKE WITH JAMIE [phone] EXT [phone],  Fax [phone]

## 2014-02-18 NOTE — Telephone Encounter (Signed)
DONE

## 2014-02-18 NOTE — Telephone Encounter (Signed)
REFER TO UROLOGY FOR KIDNEY STONES.Kendra Franklin

## 2014-02-21 ENCOUNTER — Other Ambulatory Visit: Payer: Self-pay | Admitting: Urology

## 2014-02-24 ENCOUNTER — Encounter (HOSPITAL_BASED_OUTPATIENT_CLINIC_OR_DEPARTMENT_OTHER): Payer: Self-pay | Admitting: *Deleted

## 2014-02-24 NOTE — Progress Notes (Signed)
NPO AFTER MN. ARRIVE AT 0930. NEEDS HG AND URINE PREG.

## 2014-02-27 ENCOUNTER — Encounter (HOSPITAL_BASED_OUTPATIENT_CLINIC_OR_DEPARTMENT_OTHER): Payer: Medicaid Other | Admitting: Anesthesiology

## 2014-02-27 ENCOUNTER — Encounter (HOSPITAL_BASED_OUTPATIENT_CLINIC_OR_DEPARTMENT_OTHER)
Admission: RE | Disposition: A | Discharge status: Home / Self Care | Payer: Self-pay | Source: Ambulatory Visit | Attending: Urology

## 2014-02-27 ENCOUNTER — Ambulatory Visit (HOSPITAL_BASED_OUTPATIENT_CLINIC_OR_DEPARTMENT_OTHER): Payer: Medicaid Other | Admitting: Anesthesiology

## 2014-02-27 ENCOUNTER — Encounter (HOSPITAL_BASED_OUTPATIENT_CLINIC_OR_DEPARTMENT_OTHER): Payer: Self-pay | Admitting: *Deleted

## 2014-02-27 ENCOUNTER — Ambulatory Visit (HOSPITAL_BASED_OUTPATIENT_CLINIC_OR_DEPARTMENT_OTHER)
Admission: RE | Admit: 2014-02-27 | Discharge: 2014-02-27 | Disposition: A | Discharge status: Home / Self Care | Payer: Medicaid Other | Source: Ambulatory Visit | Attending: Urology | Admitting: Urology

## 2014-02-27 DIAGNOSIS — K5090 Crohn's disease, unspecified, without complications: Secondary | ICD-10-CM | POA: Insufficient documentation

## 2014-02-27 DIAGNOSIS — N201 Calculus of ureter: Secondary | ICD-10-CM | POA: Insufficient documentation

## 2014-02-27 DIAGNOSIS — N200 Calculus of kidney: Secondary | ICD-10-CM | POA: Insufficient documentation

## 2014-02-27 HISTORY — PX: CYSTOSCOPY WITH RETROGRADE PYELOGRAM, URETEROSCOPY AND STENT PLACEMENT: SHX5789

## 2014-02-27 HISTORY — PX: HOLMIUM LASER APPLICATION: SHX5852

## 2014-02-27 HISTORY — DX: Personal history of other diseases of the digestive system: Z87.19

## 2014-02-27 HISTORY — DX: Personal history of urinary calculi: Z87.442

## 2014-02-27 HISTORY — DX: Crohn's disease of both small and large intestine without complications: K50.80

## 2014-02-27 LAB — POCT I-STAT, CHEM 8
BUN: 11 mg/dL (ref 6–23)
CHLORIDE: 105 meq/L (ref 96–112)
CREATININE: 0.8 mg/dL (ref 0.50–1.10)
Calcium, Ion: 1.33 mmol/L — ABNORMAL HIGH (ref 1.12–1.23)
GLUCOSE: 75 mg/dL (ref 70–99)
HCT: 42 % (ref 36.0–46.0)
Hemoglobin: 14.3 g/dL (ref 12.0–15.0)
POTASSIUM: 4.2 meq/L (ref 3.7–5.3)
SODIUM: 144 meq/L (ref 137–147)
TCO2: 25 mmol/L (ref 0–100)

## 2014-02-27 SURGERY — CYSTOURETEROSCOPY, WITH RETROGRADE PYELOGRAM AND STENT INSERTION
Anesthesia: General | Site: Ureter | Laterality: Left

## 2014-02-27 MED ORDER — DEXAMETHASONE SODIUM PHOSPHATE 4 MG/ML IJ SOLN
INTRAMUSCULAR | Status: DC | PRN
  Administered 2014-02-27: 10 mg via INTRAVENOUS

## 2014-02-27 MED ORDER — LIDOCAINE HCL (CARDIAC) 20 MG/ML IV SOLN
INTRAVENOUS | Status: DC | PRN
  Administered 2014-02-27: 50 mg via INTRAVENOUS

## 2014-02-27 MED ORDER — FENTANYL CITRATE 0.05 MG/ML IJ SOLN
INTRAMUSCULAR | Status: AC
  Filled 2014-02-27: qty 4

## 2014-02-27 MED ORDER — MIDAZOLAM HCL 2 MG/2ML IJ SOLN
INTRAMUSCULAR | Status: AC
  Filled 2014-02-27: qty 2

## 2014-02-27 MED ORDER — LACTATED RINGERS IV SOLN
INTRAVENOUS | Status: DC
  Administered 2014-02-27: 09:00:00 via INTRAVENOUS
  Filled 2014-02-27: qty 1000

## 2014-02-27 MED ORDER — PROMETHAZINE HCL 25 MG/ML IJ SOLN
6.2500 mg | INTRAMUSCULAR | Status: DC | PRN
  Filled 2014-02-27: qty 1

## 2014-02-27 MED ORDER — OXYBUTYNIN CHLORIDE 5 MG PO TABS
ORAL_TABLET | ORAL | Status: AC
  Filled 2014-02-27: qty 1

## 2014-02-27 MED ORDER — PHENAZOPYRIDINE HCL 100 MG PO TABS
ORAL_TABLET | ORAL | Status: AC
  Filled 2014-02-27: qty 1

## 2014-02-27 MED ORDER — PHENAZOPYRIDINE HCL 100 MG PO TABS
100.0000 mg | ORAL_TABLET | Freq: Three times a day (TID) | ORAL | Status: DC
  Administered 2014-02-27: 100 mg via ORAL
  Filled 2014-02-27: qty 1

## 2014-02-27 MED ORDER — FENTANYL CITRATE 0.05 MG/ML IJ SOLN
25.0000 ug | INTRAMUSCULAR | Status: DC | PRN
  Administered 2014-02-27: 12.5 ug via INTRAVENOUS
  Filled 2014-02-27: qty 1

## 2014-02-27 MED ORDER — PHENAZOPYRIDINE HCL 95 MG PO TABS: 95.0000 mg | ORAL_TABLET | Freq: Three times a day (TID) | ORAL | Status: DC | PRN

## 2014-02-27 MED ORDER — PROPOFOL 10 MG/ML IV BOLUS
INTRAVENOUS | Status: DC | PRN
  Administered 2014-02-27: 150 mg via INTRAVENOUS

## 2014-02-27 MED ORDER — OXYCODONE-ACETAMINOPHEN 5-325 MG PO TABS: 1.0000 | ORAL_TABLET | Freq: Four times a day (QID) | ORAL | Status: DC | PRN

## 2014-02-27 MED ORDER — IOHEXOL 350 MG/ML SOLN
INTRAVENOUS | Status: DC | PRN
  Administered 2014-02-27: 14 mL

## 2014-02-27 MED ORDER — MIDAZOLAM HCL 5 MG/5ML IJ SOLN
INTRAMUSCULAR | Status: DC | PRN
  Administered 2014-02-27: 2 mg via INTRAVENOUS

## 2014-02-27 MED ORDER — ACETAMINOPHEN 10 MG/ML IV SOLN
INTRAVENOUS | Status: DC | PRN
  Administered 2014-02-27: 1000 mg via INTRAVENOUS

## 2014-02-27 MED ORDER — OXYBUTYNIN CHLORIDE 5 MG PO TABS
5.0000 mg | ORAL_TABLET | Freq: Three times a day (TID) | ORAL | Status: DC | PRN
  Administered 2014-02-27: 5 mg via ORAL
  Filled 2014-02-27: qty 1

## 2014-02-27 MED ORDER — SODIUM CHLORIDE 0.9 % IR SOLN
Status: DC | PRN
  Administered 2014-02-27: 4000 mL

## 2014-02-27 MED ORDER — KETOROLAC TROMETHAMINE 30 MG/ML IJ SOLN
INTRAMUSCULAR | Status: DC | PRN
  Administered 2014-02-27: 30 mg via INTRAVENOUS

## 2014-02-27 MED ORDER — FENTANYL CITRATE 0.05 MG/ML IJ SOLN
INTRAMUSCULAR | Status: AC
  Filled 2014-02-27: qty 2

## 2014-02-27 MED ORDER — CEPHALEXIN 250 MG PO CAPS: 250.0000 mg | ORAL_CAPSULE | Freq: Two times a day (BID) | ORAL | Status: DC

## 2014-02-27 MED ORDER — ONDANSETRON HCL 4 MG/2ML IJ SOLN
INTRAMUSCULAR | Status: DC | PRN
  Administered 2014-02-27: 4 mg via INTRAVENOUS

## 2014-02-27 MED ORDER — GENTAMICIN IN SALINE 1.6-0.9 MG/ML-% IV SOLN
80.0000 mg | INTRAVENOUS | Status: AC
  Administered 2014-02-27: 280 mg via INTRAVENOUS
  Filled 2014-02-27: qty 50

## 2014-02-27 MED ORDER — FENTANYL CITRATE 0.05 MG/ML IJ SOLN
INTRAMUSCULAR | Status: DC | PRN
  Administered 2014-02-27 (×2): 25 ug via INTRAVENOUS

## 2014-02-27 MED ORDER — OXYBUTYNIN CHLORIDE 5 MG PO TABS: 5.0000 mg | ORAL_TABLET | Freq: Three times a day (TID) | ORAL | Status: DC | PRN

## 2014-02-27 SURGICAL SUPPLY — 46 items
BAG DRAIN URO-CYSTO SKYTR STRL (DRAIN) ×3 IMPLANT
BAG DRN UROCATH (DRAIN) ×1
BASKET LASER NITINOL 1.9FR (BASKET) ×3 IMPLANT
BASKET STNLS GEMINI 4WIRE 3FR (BASKET) IMPLANT
BASKET ZERO TIP NITINOL 2.4FR (BASKET) IMPLANT
BSKT STON RTRVL 120 1.9FR (BASKET) ×1
BSKT STON RTRVL GEM 120X11 3FR (BASKET)
BSKT STON RTRVL ZERO TP 2.4FR (BASKET)
CANISTER SUCT LVC 12 LTR MEDI- (MISCELLANEOUS) ×3 IMPLANT
CATH INTERMIT  6FR 70CM (CATHETERS) ×3 IMPLANT
CATH URET 5FR 28IN CONE TIP (BALLOONS)
CATH URET 5FR 28IN OPEN ENDED (CATHETERS) IMPLANT
CATH URET 5FR 70CM CONE TIP (BALLOONS) IMPLANT
CLOTH BEACON ORANGE TIMEOUT ST (SAFETY) ×3 IMPLANT
DRAPE CAMERA CLOSED 9X96 (DRAPES) ×3 IMPLANT
ELECT REM PT RETURN 9FT ADLT (ELECTROSURGICAL)
ELECTRODE REM PT RTRN 9FT ADLT (ELECTROSURGICAL) IMPLANT
FIBER LASER FLEXIVA 200 (UROLOGICAL SUPPLIES) IMPLANT
FIBER LASER FLEXIVA 365 (UROLOGICAL SUPPLIES) IMPLANT
FIBER LASER TRAC TIP (UROLOGICAL SUPPLIES) ×2 IMPLANT
GLOVE BIO SURGEON STRL SZ7.5 (GLOVE) ×3 IMPLANT
GLOVE BIOGEL M 6.5 STRL (GLOVE) ×2 IMPLANT
GLOVE BIOGEL PI IND STRL 6.5 (GLOVE) IMPLANT
GLOVE BIOGEL PI INDICATOR 6.5 (GLOVE) ×4
GOWN PREVENTION PLUS LG XLONG (DISPOSABLE) ×6 IMPLANT
GOWN STRL REIN XL XLG (GOWN DISPOSABLE) ×3 IMPLANT
GOWN STRL REUS W/TWL LRG LVL3 (GOWN DISPOSABLE) ×2 IMPLANT
GOWN STRL REUS W/TWL XL LVL3 (GOWN DISPOSABLE) ×2 IMPLANT
GUIDEWIRE 0.038 PTFE COATED (WIRE) IMPLANT
GUIDEWIRE ANG ZIPWIRE 038X150 (WIRE) ×3 IMPLANT
GUIDEWIRE STR DUAL SENSOR (WIRE) ×3 IMPLANT
IV NS 1000ML (IV SOLUTION) ×3
IV NS 1000ML BAXH (IV SOLUTION) IMPLANT
IV NS IRRIG 3000ML ARTHROMATIC (IV SOLUTION) ×4 IMPLANT
KIT BALLIN UROMAX 15FX10 (LABEL) IMPLANT
KIT BALLN UROMAX 15FX4 (MISCELLANEOUS) IMPLANT
KIT BALLN UROMAX 26 75X4 (MISCELLANEOUS)
PACK CYSTOSCOPY (CUSTOM PROCEDURE TRAY) ×3 IMPLANT
SET HIGH PRES BAL DIL (LABEL)
SHEATH ACCESS URETERAL 24CM (SHEATH) ×2 IMPLANT
SHEATH URET ACCESS 12FR/35CM (UROLOGICAL SUPPLIES) IMPLANT
SHEATH URET ACCESS 12FR/55CM (UROLOGICAL SUPPLIES) IMPLANT
STENT POLARIS 5FRX24 (STENTS) ×2 IMPLANT
SYRINGE 10CC LL (SYRINGE) ×3 IMPLANT
SYRINGE IRR TOOMEY STRL 70CC (SYRINGE) IMPLANT
TUBE FEEDING 8FR 16IN STR KANG (MISCELLANEOUS) ×2 IMPLANT

## 2014-02-27 NOTE — Anesthesia Preprocedure Evaluation (Signed)
Anesthesia Evaluation  Patient identified by MRN, date of birth, ID band Patient awake    Reviewed: Allergy & Precautions, H&P , NPO status , Patient's Chart, lab work & pertinent test results  Airway Mallampati: II TM Distance: >3 FB Neck ROM: Full    Dental no notable dental hx.    Pulmonary neg pulmonary ROS,  breath sounds clear to auscultation  Pulmonary exam normal       Cardiovascular negative cardio ROS  Rhythm:Regular Rate:Normal     Neuro/Psych negative neurological ROS  negative psych ROS   GI/Hepatic Neg liver ROS, H/O crohn's disease.   Endo/Other  negative endocrine ROS  Renal/GU negative Renal ROS  negative genitourinary   Musculoskeletal negative musculoskeletal ROS (+)   Abdominal   Peds negative pediatric ROS (+)  Hematology negative hematology ROS (+)   Anesthesia Other Findings   Reproductive/Obstetrics negative OB ROS                           Anesthesia Physical Anesthesia Plan  ASA: II  Anesthesia Plan: General   Post-op Pain Management:    Induction: Intravenous  Airway Management Planned: LMA  Additional Equipment:   Intra-op Plan:   Post-operative Plan: Extubation in OR  Informed Consent: I have reviewed the patients History and Physical, chart, labs and discussed the procedure including the risks, benefits and alternatives for the proposed anesthesia with the patient or authorized representative who has indicated his/her understanding and acceptance.   Dental advisory given  Plan Discussed with: CRNA  Anesthesia Plan Comments: (Has always done well with anesthesia, but does get tearful emotional upon emergence.)        Anesthesia Quick Evaluation

## 2014-02-27 NOTE — Transfer of Care (Signed)
Immediate Anesthesia Transfer of Care Note  Patient: BRISA AUTH  Procedure(s) Performed: Procedure(s): CYSTOSCOPY WITH RETROGRADE PYELOGRAM, URETEROSCOPY/ STENT PLACEMENT (Left) HOLMIUM LASER APPLICATION (Left)  Patient Location: PACU  Anesthesia Type:General  Level of Consciousness: oriented, sedated, patient cooperative and responds to stimulation  Airway & Oxygen Therapy: Patient Spontanous Breathing  Post-op Assessment: Report given to PACU RN, Post -op Vital signs reviewed and stable and Patient moving all extremities  Post vital signs: Reviewed and stable  Complications: No apparent anesthesia complications

## 2014-02-27 NOTE — Anesthesia Procedure Notes (Signed)
Procedure Name: LMA Insertion Date/Time: 02/27/2014 10:15 AM Performed by: Bethena Roys T Pre-anesthesia Checklist: Patient identified, Emergency Drugs available, Suction available and Patient being monitored Patient Re-evaluated:Patient Re-evaluated prior to inductionOxygen Delivery Method: Circle System Utilized Preoxygenation: Pre-oxygenation with 100% oxygen Intubation Type: IV induction Ventilation: Mask ventilation without difficulty LMA: LMA inserted LMA Size: 4.0 Number of attempts: 1 Airway Equipment and Method: bite block Placement Confirmation: positive ETCO2 Dental Injury: Teeth and Oropharynx as per pre-operative assessment

## 2014-02-27 NOTE — Anesthesia Postprocedure Evaluation (Signed)
Anesthesia Post Note  Patient: Kendra Franklin  Procedure(s) Performed: Procedure(s) (LRB): CYSTOSCOPY WITH RETROGRADE PYELOGRAM, URETEROSCOPY/ STENT PLACEMENT (Left) HOLMIUM LASER APPLICATION (Left)  Anesthesia type: General  Patient location: PACU  Post pain: Pain level controlled  Post assessment: Post-op Vital signs reviewed  Last Vitals: BP 115/72  Pulse 78  Temp(Src) 37.2 C (Oral)  Resp 13  Ht 5' 4"  (1.626 m)  Wt 126 lb (57.153 kg)  BMI 21.62 kg/m2  SpO2 100%  Post vital signs: Reviewed  Level of consciousness: sedated  Complications: No apparent anesthesia complications

## 2014-02-27 NOTE — H&P (Signed)
Kendra Franklin is an 23 y.o. female.    Chief Complaint: Pre-Op Left Ureteroscopic Stone Manipulation  HPI:   1 - Recurrent Nephrolithiasis -  Pre 2015 - two episodes spontaneously passed left sided stones 01/2014 - probable left sided intrarenal stones by KUB 11/2013. Was seen at ER at [facility] 01/2014 and CT obtained and sounds like left ureteral stone. Records requested but unabvailanle, images unavailable for review. Given trial of medical therapy with percoet and tamsulosin which is controlling symptoms. No emesis. No fevers. CT on disc reveals left 72m ureteral plus at least 4 small intrarenal stones about 329mor less in size.   2 - Medical Stone Disease: Eval 2015: BMP, PTH, Urate - normal; Composition  - pending; 24 hr Urines - pending  PMH sig for Chrons (remicade, follows with [facility], prior anal abscess, now very well controlled). 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 No interval fevers. Most recent UCX without sig growth.  Past Medical History  Diagnosis Date  . Crohn's     DX   DEC 2011--- ASCA 100 pANCA NEG-APR 2012 6-TGN 233(LLN 230)  6-MMPN 1173 ON 75 MG IMURAN  . History of anal fissures   . History of anal lesion     2009   NON--HEALING  . Crohn's disease, small and large intestine   . History of small intestine ulcer     2011  . History of kidney stones     Past Surgical History  Procedure Laterality Date  . Incision and drainage perirectal abscess  2009  &  2010  . Givens capsule study  DEC 2011    SB ULCERS  . Colonoscopy  DEC 2011    ILEO-COLONIC ULCERS  . Flexible sigmoidoscopy N/A 12/11/2013    Procedure: FLEXIBLE SIGMOIDOSCOPY;  Surgeon: SaDanie BinderMD;  [facility];  Service: Endoscopy;  Laterality: N/A;  1:45 PM    Family History  Problem Relation Age of Onset  .  Colon cancer Neg Hx   . Colon polyps Neg Hx   . Inflammatory bowel disease Neg Hx    Social History:  reports that she has never smoked. She has never used smokeless tobacco. She reports that she drinks alcohol. She reports that she does not use illicit drugs.  Allergies:  Allergies  Allergen Reactions  . Morphine Hives    No prescriptions prior to admission    No results found for this or any previous visit (from the past 48 hour(s)). No results found.  Review of Systems  Constitutional: Negative.  Negative for fever and chills.  HENT: Negative.   Eyes: Negative.   Respiratory: Negative.   Cardiovascular: Negative.   Gastrointestinal: Negative.   Genitourinary: Positive for urgency and flank pain.  Musculoskeletal: Negative.   Skin: Negative.   Neurological: Negative.   Endo/Heme/Allergies: Negative.   Psychiatric/Behavioral: Negative.     Height 5' 4"  (1.626 m), weight 56.7 kg (125 lb). Physical Exam  Constitutional: She is oriented to person, place, and time. She appears well-developed and well-nourished.  HENT:  Head: Normocephalic and atraumatic.  Eyes: EOM are normal. Pupils are equal, round, and reactive to light.  Neck: Normal range of motion. Neck supple.  Cardiovascular: Normal rate.   Respiratory: Effort normal.  GI: Soft. Bowel sounds are normal.  Genitourinary:  Moderate  Left CVAT  Musculoskeletal: Normal range of motion.  Neurological: She is alert and oriented to person, place, and time.  Skin: Skin is warm and dry.  Psychiatric: She has a normal mood and affect. Her behavior is normal. Judgment and thought content normal.     Assessment/Plan   1 - Nephrolithiasis - Pt wants left sided "clean out" with ureteroscopy as planned today.  I completely support this as she is young and fertile.   We rediscussed ureteroscopic stone manipulation with basketing and laser-lithotripsy in detail.  We rediscussed risks including bleeding, infection, damage to  kidney / ureter  bladder, rarely loss of kidney. We rediscussed anesthetic risks and rare but serious surgical complications including DVT, PE, MI, and mortality. We specifically readdressed that in 5-10% of cases a staged approach is required with stenting followed by re-attempt ureteroscopy if anatomy unfavorable. The patient voiced understanding and wises to proceed today as planned.   2 - Medical Stone Disease - certainly high risk stone former given young fertile female as well as inflammatory bowel disease which can cause rapid bowel transit and calcium saponification leading to abnormalities in Ca and oxalate handling.  Serum studies normal, will obtain composition and 24 hr urines to follow.   Jarel Cuadra 02/27/2014, 6:02 AM

## 2014-02-27 NOTE — Brief Op Note (Signed)
02/27/2014  10:52 AM  PATIENT:  Kendra Franklin  23 y.o. female  PRE-OPERATIVE DIAGNOSIS:  LEFT URETERAL AND RENAL STONES  POST-OPERATIVE DIAGNOSIS:  LEFT URETERAL AND RENAL STONES  PROCEDURE:  Procedure(s): CYSTOSCOPY WITH RETROGRADE PYELOGRAM, URETEROSCOPY/ STENT PLACEMENT (Left) HOLMIUM LASER APPLICATION (Left)  SURGEON:  Surgeon(s) and Role:    * Alexis Frock, MD - Primary  PHYSICIAN ASSISTANT:   ASSISTANTS: none   ANESTHESIA:   general  EBL:  Total I/O In: 200 [I.V.:200] Out: -   BLOOD ADMINISTERED:none  DRAINS: none   LOCAL MEDICATIONS USED:  NONE  SPECIMEN:  Source of Specimen:  Left Renal and Ureteral Stones  DISPOSITION OF SPECIMEN:  [facility] for compositional analysis  COUNTS:  YES  TOURNIQUET:  * No tourniquets in log *  DICTATION: .Other Dictation: Dictation Number [id number]  PLAN OF CARE: Discharge to home after PACU  PATIENT DISPOSITION:  PACU - hemodynamically stable.   Delay start of Pharmacological VTE agent (>24hrs) due to surgical blood loss or risk of bleeding: not applicable

## 2014-02-27 NOTE — Discharge Instructions (Signed)
1 - You may have urinary urgency (bladder spasms) and bloody urine on / off with stent in place. This is normal.  2 - Call MD or go to ER for fever >102, severe pain / nausea / vomiting not relieved by medications, or acute change in medical status  3 - Remove tethered stent on Monday morning by pulling on string, then blue / white plastic tubing, and discarding. Call office anytime with issues.  [facility] [phone] Post Ureteroscopy With or Without Stent Instructions  Definitions:  Ureter: The duct that transports urine from the kidney to the bladder. Stent:   A plastic hollow tube that is placed into the ureter, from the kidney to the                 bladder to prevent the ureter from swelling shut.  GENERAL INSTRUCTIONS:  Despite the fact that no skin incisions were used, the area around the ureter and bladder is raw and irritated. The stent is a foreign body which will further irritate the bladder wall. This irritation is manifested by increased frequency of urination, both day and night, and by an increase in the urge to urinate. In some, the urge to urinate is present almost always. Sometimes the urge is strong enough that you may not be able to stop yourself from urinating. The only real cure is to remove the stent and then give time for the bladder wall to heal which can't be done until the danger of the ureter swelling shut has passed, which varies.  You may see some blood in your urine while the stent is in place and a few days afterwards. Do not be alarmed, even if the urine was clear for a while. Get off your feet and drink lots of fluids until clearing occurs. If you start to pass clots or don't improve, call us.  DIET: You may return to your normal diet immediately. Because of the raw surface of your bladder, alcohol, spicy foods, acid type foods and drinks with caffeine may cause irritation or frequency and should be used in moderation. To keep your urine  flowing freely and to avoid constipation, drink plenty of fluids during the day ( 8-10 glasses ). Tip: Avoid cranberry juice because it is very acidic.  ACTIVITY: Your physical activity doesn't need to be restricted. However, if you are very active, you may see some blood in your urine. We suggest that you reduce your activity under these circumstances until the bleeding has stopped.  BOWELS: It is important to keep your bowels regular during the postoperative period. Straining with bowel movements can cause bleeding. A bowel movement every other day is reasonable. Use a mild laxative if needed, such as Milk of Magnesia 2-3 tablespoons, or 2 Dulcolax tablets. Call if you continue to have problems. If you have been taking narcotics for pain, before, during or after your surgery, you may be constipated. Take a laxative if necessary.   MEDICATION: You should resume your pre-surgery medications unless told not to. In addition you will often be given an antibiotic to prevent infection. These should be taken as prescribed until the bottles are finished unless you are having an unusual reaction to one of the drugs.  PROBLEMS YOU SHOULD REPORT TO [place]:  Fevers over 100.5 Fahrenheit.  Heavy bleeding, or clots ( See above notes about blood in urine ).  Inability to urinate.  Drug reactions ( hives, rash, nausea, vomiting, diarrhea ).  Severe burning or pain  with urination that is not improving.  FOLLOW-UP: You will need a follow-up appointment to monitor your progress. Call for this appointment at the number listed above. Usually the first appointment will be about three to fourteen days after your surgery.      Post Anesthesia Home Care Instructions  Activity: Get plenty of rest for the remainder of the day. A responsible adult should stay with you for 24 hours following the procedure.  For the next 24 hours, DO NOT: -Drive a car -Paediatric nurse -Drink alcoholic beverages -Take any  medication unless instructed by your physician -Make any legal decisions or sign important papers.  Meals: Start with liquid foods such as gelatin or soup. Progress to regular foods as tolerated. Avoid greasy, spicy, heavy foods. If nausea and/or vomiting occur, drink only clear liquids until the nausea and/or vomiting subsides. Call your physician if vomiting continues.  Special Instructions/Symptoms: Your throat may feel dry or sore from the anesthesia or the breathing tube placed in your throat during surgery. If this causes discomfort, gargle with warm salt water. The discomfort should disappear within 24 hours.

## 2014-02-28 NOTE — Op Note (Signed)
NAME:  Kendra, Franklin NO.:  MEDICAL RECORD NO.:  [id number]  LOCATION:                                 FACILITY:  PHYSICIAN:  Alexis Frock, MD     DATE OF BIRTH:  [birth year]  DATE OF PROCEDURE: 02/27/2014  DATE OF DISCHARGE:                              OPERATIVE REPORT   DIAGNOSIS:  Left renal and ureteral stones, left renal colic.  PROCEDURES: 1. Cystoscopy with left retrograde pyelogram interpretation. 2. Left ureteroscopy with laser lithotripsy. 3. Insertion of left ureteral stent 5 x 24 with tether to the thigh. 4. Left retrograde pyelogram interpretation.  ESTIMATED BLOOD LOSS:  Nil.  COMPLICATIONS:  None.  SPECIMENS:  Left ureteral and renal stones for compositional analysis.  FINDINGS: 1. Small stone in the urinary bladder likely corresponding to previous     ureteral stone now passed. 2. Multifocal upper and lower pole left renal stones and papillary tip     calcifications. 3. Otherwise unremarkable left retrograde pyelogram in urinary     bladder.  INDICATION:  Kendra Franklin is a very pleasant 23 year old young lady with history of Crohn disease that is well managed on medical therapy as well as recurrent nephrolithiasis. She presented to the [facility] ER earlier this month with colicky flank pain where CT imaging revealed a 7 mm left ureteral stone as well as multifocal left renal stones. There were no right sided noted.  She was referred for management.  Options were discussed including medical therapy versus shockwave lithotripsy versus ureteroscopy, and she wished to proceed with the latter with goal of stone free especially given her young age, fertile status, and recurrent stone disease without complete prior metabolic evaluation. Informed consent was obtained and placed in medical record.  PROCEDURE IN DETAIL:  The patient being Kendra Franklin, procedure being left ureteroscopic stone manipulation was confirmed.  Procedure  was carried out.  Time-out was performed.  Intravenous antibiotics were administered.  General LMA anesthesia was introduced.  The patient was placed into a low lithotomy position.  Sterile field was created prepping and draping the patient's vagina, introitus, and proximal thighs using iodine x3.  Next, cystourethroscopy was performed using a 22-French rigid cystoscope with 12-degree offset lens.  Inspection of urinary bladder revealed a small calcification consistent with likely recently passed ureteral stone.  This was irrigated and set aside for compositional analysis.  Otherwise, there were no diverticula, calcifications, or papular lesions.  Ureteral orifices were in the normal anatomic position.  The left worse was cannulated with a 6-French catheter and left retrograde pyelogram was obtained.  Left retrograde pyelogram demonstrates a single left ureter and single system left kidney.  There were no obvious filling defects, narrowing, or hydronephrosis noted.  A 0.038 Glidewire was advanced at the level of the upper pole and set aside as a safety wire.  Next, semi-rigid ureteroscopy was performed of the distal three-fourth of  left ureter alongside a separate Sensor working wire with an 8-French feeding tube in urinary bladder for pressure release.  No mucosal abnormalities or calcifications were noted in the left ureter.  Again, this likely corroborated passage of previous left mid ureteral stone.  Again the goal today was stone free status.  The semi-rigid ureteroscope was exchanged for a 12/14 ureteral access sheath. was carefully navigated to the proximal ureter using fluoroscopic guidance.  After which, a flexible digital ureteroscopy was performed using 8-French flexible ureteroscope, very careful systematic inspection of each calyx of the left kidney revealed multifocal small stones some of which were papillary tip, most concentrated in the upper and lower pole, majority  these appeared to be amenable to simple basketing.  As such, an escape basket was used to grasp the stones removed on their entirety sequentially. There were several small papillary tip calcifications or small calcifications immediately below the mucosa and again given the patient's young age and fertile status, it was felt that ablation of these was warranted, as such holmium laser energy was applied.  These using settings of 0.5 joules and 5 hertz with a 200 nanometer fiber. These small papillary tip calcifications were completely ablated both in the upper and lower pole. Following maneuvers, hemostasis appeared excellent.  There was no evidence of renal perforation. Systematic inspection of the kidney once again revealed complete resolution of all stone fragments.  The access sheath was removed under continuous ureteroscopic vision and no mucosal abnormalities were found.  Given that we would use the access sheath and had multiple passes of the ureteroscope it was felt that stenting was warranted.  As such, a new 5 x 24 Polaris-type stent with tether was placed using fluoroscopic and cystoscopic guidance, good proximal and distal deployment were noted.  The bladder was emptied per cystoscope. The tether was fashioned to the size and the procedure was then terminated. The patient tolerated the procedure well.  There were no immediate periprocedural complications.  The patient was taken to postanesthesia care in stable condition.          ______________________________ Alexis Frock, MD     TM/MEDQ  D:  02/27/2014  T:  02/28/2014  Job:  [id number]

## 2014-03-03 ENCOUNTER — Encounter (HOSPITAL_BASED_OUTPATIENT_CLINIC_OR_DEPARTMENT_OTHER): Payer: Self-pay | Admitting: Urology

## 2014-03-03 LAB — POCT PREGNANCY, URINE: Preg Test, Ur: NEGATIVE

## 2014-03-10 ENCOUNTER — Telehealth: Payer: Self-pay | Admitting: Gastroenterology

## 2014-03-10 NOTE — Telephone Encounter (Signed)
I called pt and she needs a doctor's documentation of when she had to miss so much school, when they thought it was crohn's but it ended up being a sciatic nerve problem. She saw Dr. Bethann Goo in [place] for that.  She will just get him to fix the note for her since she needs it by Friday.  This is for her school, so she can drop a subject instead of fail it due to having to be out for medical problems.  She said don't worry, since [name] is on vacation, she will get Dr. Bethann Goo to give her a letter/documentation.

## 2014-03-10 NOTE — Telephone Encounter (Signed)
Pt called wanting to speak with SF or DS. No other info given other than what she needs for school.

## 2014-03-26 NOTE — Telephone Encounter (Signed)
REVIEWED.  

## 2014-06-26 ENCOUNTER — Telehealth: Payer: Self-pay

## 2014-06-26 NOTE — Telephone Encounter (Signed)
Pt called and said that she missed her appt yesterday for her Remicade in [place] because she is at home for the summer.  She forgot to plan ahead of time, and she asked about getting it at [facility].  I told her I will have to send [name] a note. She said that is OK she will just call [facility] and reschedule for Mon because she starts back to school in August and her orders are there for every 8 weeks.

## 2014-06-30 NOTE — Telephone Encounter (Signed)
REVIEWED.  

## 2014-09-15 ENCOUNTER — Telehealth: Payer: Self-pay | Admitting: Gastroenterology

## 2014-09-15 NOTE — Telephone Encounter (Signed)
Pt called today to let us know that her GI doctor where she goes to college at is wanting to take her off a medication that SF had put her on and she wants to run that by [facility] first to see if that's ok. She said the medication was azathioprine 50 mg.  Please advise and call her at [phone]

## 2014-09-17 NOTE — Telephone Encounter (Signed)
PLEASE CALL PT. SHE CAN TRY GOING OFF HER IMURAN AND CONTINUING ON REMICADE ALONE. IT IS NOT UNREASONABLE TO TRY TO STOP THE IMURAN.

## 2014-09-17 NOTE — Telephone Encounter (Signed)
Tried to call pt- LMOM with instructions.

## 2014-10-10 ENCOUNTER — Other Ambulatory Visit: Payer: Self-pay | Admitting: Family Medicine

## 2014-10-13 ENCOUNTER — Telehealth: Payer: Self-pay | Admitting: Gastroenterology

## 2014-10-13 NOTE — Telephone Encounter (Signed)
Routing to refill box.

## 2014-10-13 NOTE — Telephone Encounter (Signed)
NEEDS REFILL ON [facility]   [phone]

## 2014-10-14 MED ORDER — POLYETHYLENE GLYCOL 3350 17 GM/SCOOP PO POWD: 17.0000 g | Freq: Every day | ORAL | Status: DC

## 2014-10-14 NOTE — Addendum Note (Signed)
Addended by: Mahala Menghini on: 10/14/2014 10:42 AM   Modules accepted: Orders

## 2014-10-14 NOTE — Telephone Encounter (Signed)
done

## 2014-12-08 ENCOUNTER — Telehealth: Payer: Self-pay | Admitting: Gastroenterology

## 2014-12-08 NOTE — Telephone Encounter (Signed)
Megahn called today asking if I gave SF her number would she call her back. I told her that SF was at the hospital today doing procedures, but I would let her be aware that the patient wanted to talk with her. Please call [phone]. Patient didn't give me any other information.

## 2014-12-08 NOTE — Telephone Encounter (Signed)
I called pt and she said she is getting very confused by what the doctor in [place] [facility] is telling her.   He told her that all of her symptoms are not related to Crohn's and he is getting ready to send her to a PCP in [place] and she does not know what to expect.  She wants to know if she should just make an appt with [name] or what to do.  She would like for [name] to call her mom at [phone].

## 2014-12-17 NOTE — Telephone Encounter (Signed)
Pt called and said her mom has not heard from [name]. I told her she will be in the office today and I will let her know that she has called again. I made her aware that [name] has been out of town also.

## 2014-12-17 NOTE — Telephone Encounter (Signed)
Pt's mom just called back and said the pt needs pain meds for her abdominal pain. Said she is missing classes. I told her that [name] was gbusy admitting a patient and she will be calling her.

## 2014-12-17 NOTE — Telephone Encounter (Addendum)
SAW DR. CHIMPRABHA LAST WEEK DUE TO ABDOMINAL PAIN. HAVING TROUBLE WITH R LEG PAIN AS WELL. SAW CHIROPRACTOR LAST YEAR FOR SEVER BACK PAIN & IT GOT BETTER. TAKING IMURAN QOD BECAUSE IT MAY CAUSE CANCER. WITH REMICADE Q8 WEEKS.  HAS TROUBLE WITH WALKING AND FALLING OFF AND SINCE LAST MO. PT C/O PAIN WORSE AT NIGHT(BURNING SENSATION).  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 CROHN'S SX CONTROLLED ON REMICADE.  PLAN: 1. GO BACK TO PREVIOUS IMURAN DOSE TO CONTROL ABD PAIN 2. CALLED RX FOR VICODIN 5/325 TO [address] [facility], RFx0 3. REFER TO NEUROLOGY DR. Merlene Laughter ON FEB 1 IF POSSIBLE TO CONSIDER NERVE CONDUCTION STUDIES. I WILL SPEAK WITH DR. Merlene Laughter ON JAN 20. 4. CALL WITH QUESTIONS OR CONCERNS. MOM HAS MY CELL #.

## 2014-12-18 MED ORDER — HYDROCODONE-ACETAMINOPHEN 5-325 MG PO TABS: ORAL_TABLET | ORAL | Status: DC

## 2014-12-18 NOTE — Telephone Encounter (Addendum)
[facility] [phone] TO CONFIRM THEY GOT THE VM CALLING IN THE RX OR FAX THE RX. I CALLED THE RX IN YESTERDAY.

## 2014-12-18 NOTE — Addendum Note (Signed)
Addended by: Danie Binder on: 12/18/2014 01:33 PM   Modules accepted: Orders

## 2014-12-18 NOTE — Telephone Encounter (Signed)
Noted  

## 2014-12-18 NOTE — Telephone Encounter (Signed)
Pt called and said her pharmacy did not have the pain med that DR. Field's called in. ( she also needed to let [name] know the name of the medication the other doctor had given her that did not help.Marland KitchenMarland KitchenTramadol. )

## 2014-12-18 NOTE — Telephone Encounter (Signed)
I called the pharmacist and was told the Hydrocodone cannot be e-prescribed or faxed.  Please advise!

## 2014-12-18 NOTE — Telephone Encounter (Signed)
Per [name] I called pt's mom and asked her to come pick up the prescription. She is in [place] now and said since weather might be bad tomorrow that she probably cannot pick it up until Mon. I also called pt and let her know that we could not fax or send the prescription and that her mom would try to pick up the prescription when she can.

## 2014-12-19 NOTE — Telephone Encounter (Signed)
REVIEWED-NO ADDITIONAL RECOMMENDATIONS. 

## 2014-12-23 ENCOUNTER — Other Ambulatory Visit: Payer: Self-pay

## 2014-12-23 DIAGNOSIS — K603 Anal fistula: Secondary | ICD-10-CM

## 2014-12-23 DIAGNOSIS — K50919 Crohn's disease, unspecified, with unspecified complications: Secondary | ICD-10-CM

## 2014-12-23 NOTE — Telephone Encounter (Signed)
Referral sent to Dr. Merlene Laughter

## 2014-12-23 NOTE — Telephone Encounter (Signed)
Spoke with Dr. Merlene Laughter today. WILL SEE PT AND TRY TO ACCOMMODATE THE FEB 1 REQUEST.

## 2015-02-16 ENCOUNTER — Other Ambulatory Visit: Payer: Self-pay

## 2015-02-17 ENCOUNTER — Other Ambulatory Visit: Payer: Self-pay

## 2015-02-17 MED ORDER — AZATHIOPRINE 100 MG PO TABS: 100.0000 mg | ORAL_TABLET | Freq: Every day | ORAL | Status: DC

## 2015-02-17 NOTE — Telephone Encounter (Signed)
Pt needs the prescription sent to the [facility] in [place] as listed in the chart now.

## 2015-02-18 ENCOUNTER — Other Ambulatory Visit: Payer: Self-pay | Admitting: Nurse Practitioner

## 2015-02-18 MED ORDER — AZATHIOPRINE 100 MG PO TABS: 100.0000 mg | ORAL_TABLET | Freq: Every day | ORAL | Status: DC

## 2015-02-18 NOTE — Telephone Encounter (Signed)
REFILL SENT.PT NEED OPV WITHIN NEXT  MOS E30 CROHN'S DISEASE.

## 2015-02-18 NOTE — Telephone Encounter (Signed)
Pt is aware and call transferred to [name] to schedule appt.

## 2015-03-05 ENCOUNTER — Telehealth: Payer: Self-pay

## 2015-03-05 NOTE — Telephone Encounter (Signed)
Pt called and said she has seen the doctor in [place] that [name] referred her to.  She is having problems with her stomach and bottom hurting all of the time. She had a couple of days of diarrhea and blood on tissue x 1. She said the doctor in [place] wants to do another colonoscopy on her but cannot do it before June. He wants to keep her on steroids until then and she does not want to, said she will swell up very bad. She wants to know if [name] would be able to see her and maybe do the colonoscopy before then. Please advise!

## 2015-03-09 ENCOUNTER — Telehealth: Payer: Self-pay | Admitting: Gastroenterology

## 2015-03-09 DIAGNOSIS — R197 Diarrhea, unspecified: Secondary | ICD-10-CM

## 2015-03-09 NOTE — Telephone Encounter (Signed)
Pt called and is aware that DR. Fields is off today. I told her that hopefully, we could get a reply from [name] tomorrow.

## 2015-03-09 NOTE — Telephone Encounter (Signed)
Per [name], pt's mom did not want a call from the nurse. [name] sent [name] a page.

## 2015-03-09 NOTE — Telephone Encounter (Signed)
Patient's mother called this afternoon asking to speak with SF and I told her SF was not here in the office today and could I get the nurse for her. She said, absolutely not. She needed to speak with SF ASAP and it was an emergency. She said that she used to have SF cell phone number, but she couldn't find it. I told the mother that I could page SF with her number and let her know her concerns. Mother agreed, but wasn't very happy about it. Mother can be reached at [phone]

## 2015-03-10 NOTE — Telephone Encounter (Signed)
REVIEWED-NO ADDITIONAL RECOMMENDATIONS. SEE TC APR 11.

## 2015-03-11 ENCOUNTER — Telehealth: Payer: Self-pay

## 2015-03-11 ENCOUNTER — Other Ambulatory Visit: Payer: Self-pay

## 2015-03-11 DIAGNOSIS — R197 Diarrhea, unspecified: Secondary | ICD-10-CM

## 2015-03-11 DIAGNOSIS — R109 Unspecified abdominal pain: Secondary | ICD-10-CM

## 2015-03-11 HISTORY — DX: Diarrhea, unspecified: R19.7

## 2015-03-11 NOTE — Telephone Encounter (Signed)
Pt came by office and has sample and prep instructions.

## 2015-03-11 NOTE — Telephone Encounter (Signed)
Orders have been entered 

## 2015-03-11 NOTE — Telephone Encounter (Signed)
C-diff container and order given. Triaged, see attached.

## 2015-03-11 NOTE — Telephone Encounter (Signed)
SPOKE TO MOM. PT HAVING BLOODY DIARRHEA & ABDOMINAL PAIN. NEEDS TCS. PT ON REMICADE Q8 WEEKS AND IMURAN 100 MG DAILY. NEEDS C DIFF PCR AND TCS Mar 15 829-SUPREP SAMPLE. PT WILL BY WED OR THUR TO PICK UP SAMPLE.

## 2015-03-11 NOTE — Addendum Note (Signed)
Addended by: Danie Binder on: 03/11/2015 03:09 PM   Modules accepted: Orders

## 2015-03-11 NOTE — Assessment & Plan Note (Signed)
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  STOOL Cx/ C DIFF PCR. TCS APR 18

## 2015-03-11 NOTE — Telephone Encounter (Signed)
Gastroenterology Pre-Procedure Review  Request Date: 03/11/2015 Requesting Physician: [name]  PATIENT REVIEW QUESTIONS: The patient responded to the following health history questions as indicated:    1. Diabetes Melitis: no 2. Joint replacements in the past 12 months: no 3. Major health problems in the past 3 months: no 4. Has an artificial valve or MVP: no 5. Has a defibrillator: no 6. Has been advised in past to take antibiotics in advance of a procedure like teeth cleaning: no    MEDICATIONS & ALLERGIES:    Patient reports the following regarding taking any blood thinners:   Plavix? no Aspirin? no Coumadin? no  Patient confirms/reports the following medications:  Current Outpatient Prescriptions  Medication Sig Dispense Refill  . azathioprine (IMURAN) 100 MG tablet Take 1 tablet (100 mg total) by mouth daily. 30 tablet 5  . HYDROcodone-acetaminophen (NORCO/VICODIN) 5-325 MG per tablet 1 OR 2 EVERY 4-6 H PRN FOR PAIN 30 tablet 0  . inFLIXimab (REMICADE) 100 MG injection Inject 300 mg into the vein every 8 (eight) weeks. LAST INJECTION FEB 2015    . medroxyPROGESTERone (DEPO-SUBQ PROVERA) 104 MG/0.65ML injection Inject 104 mg into the skin every 3 (three) months. LAST INJECTION FEB 2015    . oxyCODONE-acetaminophen (PERCOCET/ROXICET) 5-325 MG per tablet Take 1-2 tablets by mouth every 6 (six) hours as needed for moderate pain or severe pain. Post-operatively 30 tablet 0  . cephALEXin (KEFLEX) 250 MG capsule Take 1 capsule (250 mg total) by mouth 2 (two) times daily. X 5 days to prevent post-op infection (Patient not taking: Reported on 03/11/2015) 10 capsule 0  . ondansetron (ZOFRAN) 4 MG tablet Take 4 mg by mouth every 8 (eight) hours as needed for nausea or vomiting.    Marland Kitchen oxybutynin (DITROPAN) 5 MG tablet Take 1 tablet (5 mg total) by mouth every 8 (eight) hours as needed for bladder spasms. / stent discomfort (Patient not taking: Reported on 03/11/2015) 20 tablet 1  .  phenazopyridine (PYRIDIUM) 95 MG tablet Take 1 tablet (95 mg total) by mouth 3 (three) times daily as needed. For urinary burning (Patient not taking: Reported on 03/11/2015) 20 tablet 1  . polyethylene glycol powder (GLYCOLAX/MIRALAX) powder Take 17 g by mouth daily. As needed. (Patient not taking: Reported on 03/11/2015) 527 g 5   No current facility-administered medications for this visit.    Patient confirms/reports the following allergies:  Allergies  Allergen Reactions  . Morphine Hives    No orders of the defined types were placed in this encounter.    AUTHORIZATION INFORMATION Primary Insurance:   ID #:   Group #:  Pre-Cert / Auth required: Pre-Cert / Auth #:   Secondary Insurance:   ID #:   Group #: Pre-Cert / Auth required:  Pre-Cert / Auth #:   SCHEDULE INFORMATION: Procedure has been scheduled as follows:  Date:  Time:   Location:   This Gastroenterology Pre-Precedure Review Form is being routed to the following provider(s): Barney Drain, MD

## 2015-03-11 NOTE — Telephone Encounter (Signed)
Kendra Franklin scheduled the pt and gave her instructions.

## 2015-03-12 NOTE — Progress Notes (Signed)
   Subjective:    Patient ID: Kendra Franklin, female    DOB: October 10, 1991, 24 y.o.   MRN: [id number]  [name], MD   HPI   Past Medical History  Diagnosis Date  . Crohn's     DX   DEC 2011--- ASCA 100 pANCA NEG-APR 2012 6-TGN 233(LLN 230)  6-MMPN 1173 ON 75 MG IMURAN  . History of anal fissures   . History of anal lesion     2009   NON--HEALING  . Crohn's disease, small and large intestine   . History of small intestine ulcer     2011  . History of kidney stones     Past Surgical History  Procedure Laterality Date  . Incision and drainage perirectal abscess  2009  &  2010  . Givens capsule study  DEC 2011    SB ULCERS  . Colonoscopy  DEC 2011    ILEO-COLONIC ULCERS  . Flexible sigmoidoscopy N/A 12/11/2013    Procedure: FLEXIBLE SIGMOIDOSCOPY;  Surgeon: Danie Binder, MD;  Location: [facility];  Service: Endoscopy;  Laterality: N/A;  1:45 PM  . Cystoscopy with retrograde pyelogram, ureteroscopy and stent placement Left 02/27/2014    Procedure: CYSTOSCOPY WITH RETROGRADE PYELOGRAM, URETEROSCOPY/ STENT PLACEMENT;  Surgeon: Alexis Frock, MD;  Location: [facility];  Service: Urology;  Laterality: Left;  . Holmium laser application Left 06/04/5884    Procedure: HOLMIUM LASER APPLICATION;  Surgeon: Alexis Frock, MD;  Location: [facility];  Service: Urology;  Laterality: Left;    Review of Systems     Objective:   Physical Exam        Assessment & Plan:

## 2015-03-13 NOTE — Telephone Encounter (Signed)
REVIEWED-NO ADDITIONAL RECOMMENDATIONS. 

## 2015-03-16 ENCOUNTER — Encounter (HOSPITAL_COMMUNITY): Payer: Self-pay

## 2015-03-16 ENCOUNTER — Encounter (HOSPITAL_COMMUNITY)
Admission: RE | Disposition: A | Discharge status: Home / Self Care | Payer: Self-pay | Source: Ambulatory Visit | Attending: Gastroenterology

## 2015-03-16 ENCOUNTER — Ambulatory Visit (HOSPITAL_COMMUNITY)
Admission: RE | Admit: 2015-03-16 | Discharge: 2015-03-16 | Disposition: A | Discharge status: Home / Self Care | Payer: Medicaid Other | Source: Ambulatory Visit | Attending: Gastroenterology | Admitting: Gastroenterology

## 2015-03-16 DIAGNOSIS — R109 Unspecified abdominal pain: Secondary | ICD-10-CM | POA: Diagnosis not present

## 2015-03-16 DIAGNOSIS — R197 Diarrhea, unspecified: Secondary | ICD-10-CM | POA: Diagnosis not present

## 2015-03-16 DIAGNOSIS — Z87442 Personal history of urinary calculi: Secondary | ICD-10-CM | POA: Insufficient documentation

## 2015-03-16 DIAGNOSIS — Z79891 Long term (current) use of opiate analgesic: Secondary | ICD-10-CM | POA: Insufficient documentation

## 2015-03-16 DIAGNOSIS — Z8719 Personal history of other diseases of the digestive system: Secondary | ICD-10-CM | POA: Insufficient documentation

## 2015-03-16 DIAGNOSIS — K648 Other hemorrhoids: Secondary | ICD-10-CM | POA: Insufficient documentation

## 2015-03-16 DIAGNOSIS — K921 Melena: Secondary | ICD-10-CM | POA: Diagnosis not present

## 2015-03-16 DIAGNOSIS — R1013 Epigastric pain: Secondary | ICD-10-CM | POA: Diagnosis present

## 2015-03-16 DIAGNOSIS — K2950 Unspecified chronic gastritis without bleeding: Secondary | ICD-10-CM | POA: Diagnosis not present

## 2015-03-16 DIAGNOSIS — Z79899 Other long term (current) drug therapy: Secondary | ICD-10-CM | POA: Diagnosis not present

## 2015-03-16 DIAGNOSIS — K625 Hemorrhage of anus and rectum: Secondary | ICD-10-CM | POA: Diagnosis present

## 2015-03-16 DIAGNOSIS — Z792 Long term (current) use of antibiotics: Secondary | ICD-10-CM | POA: Insufficient documentation

## 2015-03-16 HISTORY — PX: ESOPHAGOGASTRODUODENOSCOPY: SHX5428

## 2015-03-16 HISTORY — PX: COLONOSCOPY: SHX5424

## 2015-03-16 SURGERY — COLONOSCOPY
Anesthesia: Moderate Sedation

## 2015-03-16 MED ORDER — PANTOPRAZOLE SODIUM 40 MG PO TBEC: DELAYED_RELEASE_TABLET | ORAL | Status: DC

## 2015-03-16 MED ORDER — SODIUM CHLORIDE 0.9 % IV SOLN
INTRAVENOUS | Status: DC
  Administered 2015-03-16: 08:00:00 via INTRAVENOUS

## 2015-03-16 MED ORDER — LIDOCAINE VISCOUS 2 % MT SOLN
OROMUCOSAL | Status: AC
  Filled 2015-03-16: qty 15

## 2015-03-16 MED ORDER — SODIUM CHLORIDE 0.9 % IJ SOLN
INTRAMUSCULAR | Status: AC
  Filled 2015-03-16: qty 3

## 2015-03-16 MED ORDER — PROMETHAZINE HCL 25 MG/ML IJ SOLN
INTRAMUSCULAR | Status: AC
  Filled 2015-03-16: qty 1

## 2015-03-16 MED ORDER — MEPERIDINE HCL 100 MG/ML IJ SOLN
INTRAMUSCULAR | Status: AC
  Filled 2015-03-16: qty 2

## 2015-03-16 MED ORDER — MIDAZOLAM HCL 5 MG/5ML IJ SOLN
INTRAMUSCULAR | Status: AC
  Filled 2015-03-16: qty 10

## 2015-03-16 MED ORDER — LIDOCAINE VISCOUS 2 % MT SOLN: OROMUCOSAL | Status: DC

## 2015-03-16 MED ORDER — LIDOCAINE VISCOUS 2 % MT SOLN
OROMUCOSAL | Status: DC | PRN
  Administered 2015-03-16: 4 mL via OROMUCOSAL

## 2015-03-16 MED ORDER — MIDAZOLAM HCL 5 MG/5ML IJ SOLN
INTRAMUSCULAR | Status: DC | PRN
  Administered 2015-03-16: 2 mg via INTRAVENOUS
  Administered 2015-03-16 (×3): 1 mg via INTRAVENOUS

## 2015-03-16 MED ORDER — MEPERIDINE HCL 100 MG/ML IJ SOLN
INTRAMUSCULAR | Status: DC | PRN
  Administered 2015-03-16 (×2): 25 mg via INTRAVENOUS
  Administered 2015-03-16: 50 mg via INTRAVENOUS

## 2015-03-16 MED ORDER — SIMETHICONE 40 MG/0.6ML PO SUSP
ORAL | Status: DC | PRN
  Administered 2015-03-16: 09:00:00

## 2015-03-16 MED ORDER — PROMETHAZINE HCL 25 MG/ML IJ SOLN
INTRAMUSCULAR | Status: DC | PRN
  Administered 2015-03-16: 12.5 mg via INTRAVENOUS

## 2015-03-16 NOTE — Discharge Instructions (Signed)
YOUR UPPER ABDOMINAL PAIN IS MOST LIKELY DUE TO GASTRITIS AND STRESS.  You have SMALL internal hemorrhoids.  I BIOPSIED YOUR STOMACH, AND SMALL BOWEL. YOUR SMALL BOWEL AND COLON LOOKED NORMAL. THE REMICADE AND IMURAN ARE WORKING.   CONTINUE REMICADE AND IMURAN.  START PROTONIX 30 MINUTES PRIOR TO MEALS TWICE DAILY.  USE VISCOUS LIDOCAINE 2 TSP AS NEEDED FOR FLARES OF HEARTBURN, CHEST PAIN, OR UPPER ABDOMINAL PAIN. YOU MAY REPEAT EVERY 4 HOURS. DO NOT USE MORE THAN 8 DOSES A DAY.  AVOID ITEMS THAT TRIGGER GASTRITIS. SEE INFO BELOW  FOLLOW A LOW FAT DIET. AVOID ITEMS THAT CAUSE BLOATING. SEE INFO BELOW.  YOUR BIOPSY RESULTS WILL BE AVAILABLE IN MY CHART AFTER APR 20 and MY OFFICE WILL CONTACT YOU IN 10-14 DAYS WITH YOUR RESULTS.    FOLLOW UP IN 3 MOS.   ENDOSCOPY Care After Read the instructions outlined below and refer to this sheet in the next week. These discharge instructions provide you with general information on caring for yourself after you leave the hospital. While your treatment has been planned according to the most current medical practices available, unavoidable complications occasionally occur. If you have any problems or questions after discharge, call DR. Carr Shartzer, [phone].  ACTIVITY  You may resume your regular activity, but move at a slower pace for the next 24 hours.   Take frequent rest periods for the next 24 hours.   Walking will help get rid of the air and reduce the bloated feeling in your belly (abdomen).   No driving for 24 hours (because of the medicine (anesthesia) used during the test).   You may shower.   Do not sign any important legal documents or operate any machinery for 24 hours (because of the anesthesia used during the test).    NUTRITION  Drink plenty of fluids.   You may resume your normal diet as instructed by your doctor.   Begin with a light meal and progress to your normal diet. Heavy or fried foods are harder to digest and may  make you feel sick to your stomach (nauseated).   Avoid alcoholic beverages for 24 hours or as instructed.    MEDICATIONS  You may resume your normal medications.   WHAT YOU CAN EXPECT TODAY  Some feelings of bloating in the abdomen.   Passage of more gas than usual.   Spotting of blood in your stool or on the toilet paper  .  IF YOU HAD POLYPS REMOVED DURING THE ENDOSCOPY:  Eat a soft diet IF YOU HAVE NAUSEA, BLOATING, ABDOMINAL PAIN, OR VOMITING.    FINDING OUT THE RESULTS OF YOUR TEST Not all test results are available during your visit. [name] WILL CALL YOU WITHIN 14 DAYS OF YOUR PROCEDUE WITH YOUR RESULTS. Do not assume everything is normal if you have not heard from DR. Fontaine Hehl, CALL HER OFFICE AT [phone].  SEEK IMMEDIATE MEDICAL ATTENTION AND CALL THE OFFICE: [phone] IF:  You have more than a spotting of blood in your stool.   Your belly is swollen (abdominal distention).   You are nauseated or vomiting.   You have a temperature over 101F.   You have abdominal pain or discomfort that is severe or gets worse throughout the day.   Gastritis  Gastritis is an inflammation (the body's way of reacting to injury and/or infection) of the stomach. It can also be caused BY ASPIRIN, BC/GOODY POWDER'S, (IBUPROFEN) MOTRIN, OR ALEVE (NAPROXEN), chemicals (including alcohol), SPICY FOODS, and medications. This  illness may be associated with generalized malaise (feeling tired, not well), UPPER ABDOMINAL STOMACH cramps, and fever. One common bacterial cause of gastritis is an organism known as H. Pylori. This can be treated with antibiotics.    Low-Fat Diet BREADS, CEREALS, PASTA, RICE, DRIED PEAS, AND BEANS These products are high in carbohydrates and most are low in fat. Therefore, they can be increased in the diet as substitutes for fatty foods. They too, however, contain calories and should not be eaten in excess. Cereals can be eaten for snacks as well as for  breakfast.  Include foods that contain fiber (fruits, vegetables, whole grains, and legumes). Research shows that fiber may lower blood cholesterol levels, especially the water-soluble fiber found in fruits, vegetables, oat products, and legumes. FRUITS AND VEGETABLES It is good to eat fruits and vegetables. Besides being sources of fiber, both are rich in vitamins and some minerals. They help you get the daily allowances of these nutrients. Fruits and vegetables can be used for snacks and desserts. MEATS Limit lean meat, chicken, [place], and fish to no more than 6 ounces per day. Beef, Pork, and Lamb Use lean cuts of beef, pork, and lamb. Lean cuts include:  Extra-lean ground beef.  Arm roast.  Sirloin tip.  Center-cut ham.  Round steak.  Loin chops.  Rump roast.  Tenderloin.  Trim all fat off the outside of meats before cooking. It is not necessary to severely decrease the intake of red meat, but lean choices should be made. Lean meat is rich in protein and contains a highly absorbable form of iron. Premenopausal women, in particular, should avoid reducing lean red meat because this could increase the risk for low red blood cells (iron-deficiency anemia). The organ meats, such as liver, sweetbreads, kidneys, and brain are very rich in cholesterol. They should be limited. Chicken and [place] These are good sources of protein. The fat of poultry can be reduced by removing the skin and underlying fat layers before cooking. Chicken and [place] can be substituted for lean red meat in the diet. Poultry should not be fried or covered with high-fat sauces. Fish and Shellfish Fish is a good source of protein. Shellfish contain cholesterol, but they usually are low in saturated fatty acids. The preparation of fish is important. Like chicken and [place], they should not be fried or covered with high-fat sauces. EGGS Egg whites contain no fat or cholesterol. They can be eaten often. Try 1 to 2 egg whites  instead of whole eggs in recipes or use egg substitutes that do not contain yolk. MILK AND DAIRY PRODUCTS Use skim or 1% milk instead of 2% or whole milk. Decrease whole milk, natural, and processed cheeses. Use nonfat or low-fat (2%) cottage cheese or low-fat cheeses made from vegetable oils. Choose nonfat or low-fat (1 to 2%) yogurt. Experiment with evaporated skim milk in recipes that call for heavy cream. Substitute low-fat yogurt or low-fat cottage cheese for sour cream in dips and salad dressings. Have at least 2 servings of low-fat dairy products, such as 2 glasses of skim (or 1%) milk each day to help get your daily calcium intake.  FATS AND OILS Reduce the total intake of fats, especially saturated fat. Butterfat, lard, and beef fats are high in saturated fat and cholesterol. These should be avoided as much as possible. Vegetable fats do not contain cholesterol, but certain vegetable fats, such as coconut oil, palm oil, and palm kernel oil are very high in saturated fats. These should  be limited. These fats are often used in bakery goods, processed foods, popcorn, oils, and nondairy creamers. Vegetable shortenings and some peanut butters contain hydrogenated oils, which are also saturated fats. Read the labels on these foods and check for saturated vegetable oils. Unsaturated vegetable oils and fats do not raise blood cholesterol. However, they should be limited because they are fats and are high in calories. Total fat should still be limited to 30% of your daily caloric intake. Desirable liquid vegetable oils are corn oil, cottonseed oil, olive oil, canola oil, safflower oil, soybean oil, and sunflower oil. Peanut oil is not as good, but small amounts are acceptable. Buy a heart-healthy tub margarine that has no partially hydrogenated oils in the ingredients. Mayonnaise and salad dressings often are made from unsaturated fats, but they should also be limited because of their high calorie and fat  content. Seeds, nuts, peanut butter, olives, and avocados are high in fat, but the fat is mainly the unsaturated type. These foods should be limited mainly to avoid excess calories and fat. OTHER EATING TIPS Snacks  Most sweets should be limited as snacks. They tend to be rich in calories and fats, and their caloric content outweighs their nutritional value. Some good choices in snacks are graham crackers, melba toast, soda crackers, bagels (no egg), English muffins, fruits, and vegetables. These snacks are preferable to snack crackers, [place] fries, and chips. Popcorn should be air-popped or cooked in small amounts of liquid vegetable oil. Desserts Eat fruit, low-fat yogurt, and fruit ices. AVOID pastries, cake, and cookies. Sherbet, angel food cake, gelatin dessert, frozen low-fat yogurt, or other frozen products that do not contain saturated fat (pure fruit juice bars, frozen ice pops) are also acceptable.  COOKING METHODS Choose those methods that use little or no fat. They include: Poaching.  Braising.  Steaming.  Grilling.  Baking.  Stir-frying.  Broiling.  Microwaving.  Foods can be cooked in a nonstick pan without added fat, or use a nonfat cooking spray in regular cookware. Limit fried foods and avoid frying in saturated fat. Add moisture to lean meats by using water, broth, cooking wines, and other nonfat or low-fat sauces along with the cooking methods mentioned above. Soups and stews should be chilled after cooking. The fat that forms on top after a few hours in the refrigerator should be skimmed off. When preparing meals, avoid using excess salt. Salt can contribute to raising blood pressure in some people. EATING AWAY FROM HOME Order entres, potatoes, and vegetables without sauces or butter. When meat exceeds the size of a deck of cards (3 to 4 ounces), the rest can be taken home for another meal. Choose vegetable or fruit salads and ask for low-calorie salad dressings to be  served on the side. Use dressings sparingly. Limit high-fat toppings, such as bacon, crumbled eggs, cheese, sunflower seeds, and olives. Ask for heart-healthy tub margarine instead of butter.  Hemorrhoids Hemorrhoids are dilated (enlarged) veins around the rectum. Sometimes clots will form in the veins. This makes them swollen and painful. These are called thrombosed hemorrhoids. Causes of hemorrhoids include:  Constipation.   Straining to have a bowel movement.   HEAVY LIFTING  HOME CARE INSTRUCTIONS  Eat a well balanced diet and drink 6 to 8 glasses of water every day to avoid constipation. You may also use a bulk laxative.   Avoid straining to have bowel movements.   Keep anal area dry and clean.   Do not use a donut shaped pillow  or sit on the toilet for long periods. This increases blood pooling and pain.   Move your bowels when your body has the urge; this will require less straining and will decrease pain and pressure.

## 2015-03-16 NOTE — Op Note (Signed)
[facility] [address] [place], [address]   ENDOSCOPY PROCEDURE REPORT  PATIENT: Kendra, Franklin  MR#: [id number] BIRTHDATE: [birth year] , 23  yrs. old GENDER: female  ENDOSCOPIST: Danie Binder, MD REFERRED PE:[id number] Laurance Flatten, M.D.  PROCEDURE DATE: 2015/03/27 PROCEDURE:   EGD w/ biopsy  INDICATIONS:epigastric pain. MEDICATIONS: TCS + Versed 1 mg IV and Demerol 25 mg IV TOPICAL ANESTHETIC:   Viscous Xylocaine ASA CLASS:  DESCRIPTION OF PROCEDURE:     Physical exam was performed.  Informed consent was obtained from the patient after explaining the benefits, risks, and alternatives to the procedure.  The patient was connected to the monitor and placed in the left lateral position.  Continuous oxygen was provided by nasal cannula and IV medicine administered through an indwelling cannula.  After administration of sedation, the patients esophagus was intubated and the EG-[id number] ([id number])  endoscope was advanced under direct visualization to the second portion of the duodenum.  The scope was removed slowly by carefully examining the color, texture, anatomy, and integrity of the mucosa on the way out.  The patient was recovered in endoscopy and discharged home in satisfactory condition.   ESOPHAGUS: The mucosa of the esophagus appeared normal.   STOMACH: Mild non-erosive gastritis (inflammation) was found in the gastric antrum.  Multiple biopsies were performed using cold forceps. DUODENUM: The duodenal mucosa showed no abnormalities in the bulb and 2nd part of the duodenum.  Cold forceps biopsies were taken in the bulb and second portion. COMPLICATIONS: There were no immediate complications.  ENDOSCOPIC IMPRESSION: 1.   EPIGASTRIC PAIN MOST LIKELY DUE TO GASTRTIS/PSYCHOSOCIAL STRESSORS, LESS LIKELY GERD 2.   MILD Non-erosive gastritis  RECOMMENDATIONS: CONTINUE REMICADE AND IMURAN. START PROTONIX 30 MINUTES PRIOR TO MEALS TWICE DAILY. USE VISCOUS  LIDOCAINE 2 TSP AS NEEDED FOR HEARTBURN, CHEST PAIN, OR UPPER ABDOMINAL PAIN.  MAY REPEAT EVERY 4 HOURS.  DO NOT USE MORE THAN 8 DOSES A DAY. AVOID ITEMS THAT TRIGGER GASTRITIS. FOLLOW A LOW FAT DIET. AWAIT BIOPSY. FOLLOW UP IN 3 MOS.  REPEAT EXAM: eSigned:  Danie Binder, MD Mar 27, 2015 2:17 PM  CPT CODES: ICD CODES:  The ICD and CPT codes recommended by this software are interpretations from the data that the clinical staff has captured with the software.  The verification of the translation of this report to the ICD and CPT codes and modifiers is the sole responsibility of the health care institution and practicing physician where this report was generated.  [place] [employer]. will not be held responsible for the validity of the ICD and CPT codes included on this report.  AMA assumes no liability for data contained or not contained herein. CPT is a Designer, television/film set of the [employer].

## 2015-03-16 NOTE — Op Note (Signed)
[facility] [address] [place], [address]   COLONOSCOPY PROCEDURE REPORT  PATIENT: Kendra Franklin, Kendra Franklin  MR#: [id number] BIRTHDATE: 1991-08-01 , 23  yrs. old GENDER: female ENDOSCOPIST: Danie Binder, MD REFERRED [id number] Laurance Flatten, M.D. PROCEDURE DATE:  2015-03-18 PROCEDURE:   Colonoscopy, diagnostic INDICATIONS:abdominal pain, diarrhea, RARE RECTAL BLEEDING. PMHx CROHN'S DISEASE ON IMURAN/REMICADE. MEDICATIONS: Promethazine (Phenergan) 12.5 mg IV, Demerol 75 mg IV, and Versed 5 mg IV  DESCRIPTION OF PROCEDURE:    Physical exam was performed.  Informed consent was obtained from the patient after explaining the benefits, risks, and alternatives to procedure.  The patient was connected to monitor and placed in left lateral position. Continuous oxygen was provided by nasal cannula and IV medicine administered through an indwelling cannula.  After administration of sedation and rectal exam, the patients rectum was intubated and the EC-[id number] ([id number])  colonoscope was advanced under direct visualization to the ileum.  The scope was removed slowly by carefully examining the color, texture, anatomy, and integrity mucosa on the way out.  The patient was recovered in endoscopy and discharged home in satisfactory condition.    COLON FINDINGS: The examined terminal ileum appeared to be normal. , The colon was redundant.  Manual abdominal counter-pressure was used to reach the cecum.  The patient was moved on to their back to reach the cecum, The examination was otherwise normal.  , and Small internal hemorrhoids were found.  PREP QUALITY: excellent.  CECAL W/D TIME: 12       minutes COMPLICATIONS: None  ENDOSCOPIC IMPRESSION: 1.   The examined terminal ileum appeared to be normal 2.   The LEFT colon IS redundant 3.   RECTAL BLEEDING DUE TO Small internal hemorrhoids  RECOMMENDATIONS: CONTINUE REMICADE AND IMURAN. START PROTONIX 30 MINUTES PRIOR TO MEALS TWICE  DAILY. USE VISCOUS LIDOCAINE 2 TSP AS NEEDED FOR HEARTBURN, CHEST PAIN, OR UPPER ABDOMINAL PAIN.  MAY REPEAT EVERY 4 HOURS.  DO NOT USE MORE THAN 8 DOSES A DAY. AVOID ITEMS THAT TRIGGER GASTRITIS. FOLLOW A LOW FAT DIET. AWAIT BIOPSY. FOLLOW UP IN 3 MOS.   eSigned:  Danie Binder, MD 03-18-2015 2:20 PM   CPT CODES: ICD CODES:  The ICD and CPT codes recommended by this software are interpretations from the data that the clinical staff has captured with the software.  The verification of the translation of this report to the ICD and CPT codes and modifiers is the sole responsibility of the health care institution and practicing physician where this report was generated.  [employer] will not be held responsible for the validity of the ICD and CPT codes included on this report.  AMA assumes no liability for data contained or not contained herein. CPT is a Designer, television/film set of the [employer].

## 2015-03-16 NOTE — H&P (Addendum)
Primary Care Physician:  [name], MD Primary Gastroenterologist:  [name]  Pre-Procedure History & Physical: HPI:  Kendra Franklin is a 24 y.o. female here for  DIARRHEA/epigastric pain/dyspepsia..  Past Medical History  Diagnosis Date  . Crohn's     DX   DEC 2011--- ASCA 100 pANCA NEG-APR 2012 6-TGN 233(LLN 230)  6-MMPN 1173 ON 75 MG IMURAN  . History of anal fissures   . History of anal lesion     2009   NON--HEALING  . Crohn's disease, small and large intestine   . History of small intestine ulcer     2011  . History of kidney stones     Past Surgical History  Procedure Laterality Date  . Incision and drainage perirectal abscess  2009  &  2010  . Givens capsule study  DEC 2011    SB ULCERS  . Colonoscopy  DEC 2011    ILEO-COLONIC ULCERS  . Flexible sigmoidoscopy N/A 12/11/2013    Procedure: FLEXIBLE SIGMOIDOSCOPY;  Surgeon: Danie Binder, MD;  [facility];  Service: Endoscopy;  Laterality: N/A;  1:45 PM  . Cystoscopy with retrograde pyelogram, ureteroscopy and stent placement Left 02/27/2014    Procedure: CYSTOSCOPY WITH RETROGRADE PYELOGRAM, URETEROSCOPY/ STENT PLACEMENT;  Surgeon: Alexis Frock, MD;  [facility];  Service: Urology;  Laterality: Left;  . Holmium laser application Left 06/04/5884    Procedure: HOLMIUM LASER APPLICATION;  Surgeon: Alexis Frock, MD;  Location: [facility];  Service: Urology;  Laterality: Left;    Prior to Admission medications   Medication Sig Start Date End Date Taking? Authorizing Provider  azathioprine (IMURAN) 100 MG tablet Take 1 tablet (100 mg total) by mouth daily. 02/18/15  Yes Carlis Stable, NP  HYDROcodone-acetaminophen (NORCO/VICODIN) 5-325 MG per tablet 1 OR 2 EVERY 4-6 H PRN FOR PAIN Patient taking differently: Take 1-2 tablets by mouth every 6 (six) hours as needed. 1 OR 2 EVERY 4-6 H PRN FOR PAIN 12/18/14  Yes Danie Binder, MD  inFLIXimab (REMICADE) 100 MG injection  Inject 300 mg into the vein every 8 (eight) weeks. LAST INJECTION FEB 2015 07/07/11  Yes Danie Binder, MD  medroxyPROGESTERone (DEPO-SUBQ PROVERA) 104 MG/0.65ML injection Inject 104 mg into the skin every 3 (three) months. LAST INJECTION FEB 2015   Yes Historical Provider, MD  oxyCODONE-acetaminophen (PERCOCET/ROXICET) 5-325 MG per tablet Take 1-2 tablets by mouth every 6 (six) hours as needed for moderate pain or severe pain. Post-operatively 02/27/14  Yes Alexis Frock, MD  polyethylene glycol powder (GLYCOLAX/MIRALAX) powder Take 17 g by mouth daily. As needed. Patient taking differently: Take 17 g by mouth daily as needed for mild constipation. As needed. 10/14/14  Yes Mahala Menghini, PA-C  cephALEXin (KEFLEX) 250 MG capsule Take 1 capsule (250 mg total) by mouth 2 (two) times daily. X 5 days to prevent post-op infection Patient not taking: Reported on 03/11/2015 02/27/14   Alexis Frock, MD  oxybutynin (DITROPAN) 5 MG tablet Take 1 tablet (5 mg total) by mouth every 8 (eight) hours as needed for bladder spasms. / stent discomfort Patient not taking: Reported on 03/11/2015 02/27/14   Alexis Frock, MD  phenazopyridine (PYRIDIUM) 95 MG tablet Take 1 tablet (95 mg total) by mouth 3 (three) times daily as needed. For urinary burning Patient not taking: Reported on 03/11/2015 02/27/14   Alexis Frock, MD    Allergies as of 03/11/2015 - Review Complete 03/11/2015  Allergen Reaction Noted  .  Morphine Hives     Family History  Problem Relation Age of Onset  . Colon cancer Neg Hx   . Colon polyps Neg Hx   . Inflammatory bowel disease Neg Hx     History   Social History  . Marital Status: Single    Spouse Name: N/A  . Number of Children: N/A  . Years of Education: N/A   Occupational History  . Not on file.   Social History Main Topics  . Smoking status: Never Smoker   . Smokeless tobacco: Never Used  . Alcohol Use: Yes     Comment: RARE  . Drug Use: No  . Sexual Activity: Not on file    Other Topics Concern  . Not on file   Social History Narrative   Graduating this spring. Straight A student. Going to [employer]. Mother: Arlyss Repress. No siblings. Mother smokes.    Review of Systems: See HPI, otherwise negative ROS   Physical Exam: BP 120/89 mmHg  Temp(Src) 97.8 F (36.6 C) (Oral)  Resp 22  Ht 5' 4"  (1.626 m)  Wt 130 lb (58.968 kg)  BMI 22.30 kg/m2  SpO2 98% General:   Alert,  pleasant and cooperative in NAD Head:  Normocephalic and atraumatic. Neck:  Supple; Lungs:  Clear throughout to auscultation.    Heart:  Regular rate and rhythm. Abdomen:  Soft, nontender and nondistended. Normal bowel sounds, without guarding, and without rebound.   Neurologic:  Alert and  oriented x4;  grossly normal neurologically.  Impression/Plan:     Diarrhea/EPIGASTRIC PAIN/DYSPEPSIA.  PLAN: TCS/EGD TODAY WITH BIOPSY

## 2015-03-18 ENCOUNTER — Encounter (HOSPITAL_COMMUNITY): Payer: Self-pay | Admitting: Gastroenterology

## 2015-03-30 ENCOUNTER — Telehealth: Payer: Self-pay | Admitting: Gastroenterology

## 2015-03-30 NOTE — Telephone Encounter (Addendum)
Please call pt. HER stomach Bx shows mild gastritis. HER ABDOMINAL PAIN IS DUE TO GASTRITIS & STRESS. PROTONIX BID. LIDOCAINE PRN. LOW FAT DIET. [place]. OPV E30 ABDOMINAL PAIN IN JUL 2016.

## 2015-03-30 NOTE — Telephone Encounter (Signed)
ON RECALL FOR July WITH [facility]

## 2015-03-30 NOTE — Telephone Encounter (Signed)
Pt is aware of results and recommendations.

## 2015-04-02 ENCOUNTER — Encounter: Payer: Medicaid Other | Admitting: Gastroenterology

## 2015-04-02 ENCOUNTER — Ambulatory Visit: Payer: Medicaid Other | Admitting: Gastroenterology

## 2015-04-28 ENCOUNTER — Telehealth: Payer: Self-pay

## 2015-04-28 NOTE — Telephone Encounter (Signed)
PT called and would like for [place] to fax her last colonoscopy results to her doctor in [place]. She will call back with the fax number. She is not sure of his name, but he is at [facility].

## 2015-05-11 ENCOUNTER — Telehealth: Payer: Self-pay | Admitting: Gastroenterology

## 2015-05-11 ENCOUNTER — Encounter: Payer: Self-pay | Admitting: Gastroenterology

## 2015-05-11 NOTE — Telephone Encounter (Signed)
If anyone from [facility] calls regarding records on patient. I received the request on Monday 05/11/2015 and faxed the records on 05/11/2015 at 620 pm have the confirmation that they went through.

## 2015-05-12 NOTE — Telephone Encounter (Signed)
Noted  

## 2015-05-22 ENCOUNTER — Telehealth: Payer: Self-pay

## 2015-05-22 NOTE — Telephone Encounter (Signed)
Pt called and cancelled her ov appt on 06/11/2015 because she is scheduled for Remicade that day. She wanted to come 06/18/2015 but Dr. Nona Dell schedule is full. No appts with [name] until mid August and pt said she has to have one before then because she returns to school the first of August.  Please advise!

## 2015-05-25 ENCOUNTER — Encounter: Payer: Self-pay | Admitting: Gastroenterology

## 2015-05-25 NOTE — Telephone Encounter (Signed)
Pt is aware. Sending to [facility] to put the appt in.

## 2015-05-25 NOTE — Telephone Encounter (Signed)
OV MADE AND LETTER MAILED

## 2015-05-25 NOTE — Telephone Encounter (Signed)
PUT PT IN 1130 OPV JUL 21

## 2015-06-11 ENCOUNTER — Telehealth: Payer: Self-pay

## 2015-06-11 ENCOUNTER — Ambulatory Visit: Payer: Medicaid Other | Admitting: Gastroenterology

## 2015-06-11 NOTE — Telephone Encounter (Signed)
PT thought her appt for Remicade was today in [place]. She drove there and then found out it is not until Monday, 06/15/2015.  She wants to know if [name] can arrange for her to have it here or if she will need to drive back to [place] on Monday. Please advise!

## 2015-06-11 NOTE — Telephone Encounter (Signed)
PLEASE CALL PT. HER INSURANCE MAY NOT PAY FOR MEDS IN [place]. WILL FAX ORDER TODAY AND SHE WILL NEED TO CALL MEDICAID AND ASK THEM IF THEY WILL COVER IT OR IF SHE HAS TO GO BACK TO [place].

## 2015-06-11 NOTE — Telephone Encounter (Signed)
Opened in error

## 2015-06-11 NOTE — Telephone Encounter (Signed)
I called and LMOM for pt to call Medicaid first thing in the AM and ask about this.

## 2015-06-12 NOTE — Telephone Encounter (Signed)
I called the patient and spoke with her mom letting her know that after speaking with [name] the best plan of action would be for her to keep her Remicade Infusion appointment on Monday in [place] and she stated "no problem" they will take care of it.

## 2015-06-12 NOTE — Telephone Encounter (Signed)
I spoke with the patient and she stated she will keep her Remicade Infusion appointment in [place].

## 2015-06-18 ENCOUNTER — Encounter: Payer: Self-pay | Admitting: Gastroenterology

## 2015-06-18 ENCOUNTER — Ambulatory Visit (INDEPENDENT_AMBULATORY_CARE_PROVIDER_SITE_OTHER): Payer: Medicaid Other | Admitting: Gastroenterology

## 2015-06-18 VITALS — BP 113/75 | HR 100 | Temp 98.6°F | Ht 64.0 in | Wt 136.4 lb

## 2015-06-18 DIAGNOSIS — K50913 Crohn's disease, unspecified, with fistula: Secondary | ICD-10-CM

## 2015-06-18 NOTE — Progress Notes (Signed)
ON RECALL  °

## 2015-06-18 NOTE — Patient Instructions (Signed)
CONTINUE REMICADE/IMURAN.  PLEASE CALL WITH QUESTIONS OR CONCERNS.  FOLLOW UP IN 6 MOS.

## 2015-06-18 NOTE — Progress Notes (Signed)
Subjective:    Patient ID: Kendra Franklin, female    DOB: July 01, 1991, 24 y.o.   MRN: [id number]  [name], MD  HPI HAS LOOSE STOOL WITH SPICY FOODS & BBQ SAUCE. DIDN'T TAKE THE ENTOCORT BUT STILL HAS IT. BMs: ONCE A DAY (#3 OR 4). NO SORES IN MOUTH, RASH ON LEGS, JOINT PAIN, OR BACK PAIN. NAUSEATED AFTER REMICADE YESTERDAY. ATE BEFORE INFUSION. STAYED IN BED ALL DAY BECAUSE SHE FELT BED. 2 WEEKS AGO HAD WATERY DIARRHEA AFTER SPICY FOOD: [place]. NO NOCTURNAL STOOLS. DRAINS A LITTLE AND HAS MILD TO SEVER PAIN AROUND HER RECTUM IF SHE HAS MORE THAN 2 BMs.  PT DENIES FEVER, CHILLS, HEMATOCHEZIA, vomiting, melena, CHEST PAIN, SHORTNESS OF BREATH,  constipation, abdominal pain, problems swallowing, OR heartburn or indigestion.  Past Medical History  Diagnosis Date  . Crohn's     DX   DEC 2011--- ASCA 100 pANCA NEG-APR 2012 6-TGN 233(LLN 230)  6-MMPN 1173 ON 75 MG IMURAN  . History of anal fissures   . History of anal lesion     2009   NON--HEALING  . Crohn's disease, small and large intestine   . History of small intestine ulcer     2011  . History of kidney stones    Past Surgical History  Procedure Laterality Date  . Incision and drainage perirectal abscess  2009  &  2010  . Givens capsule study  DEC 2011    SB ULCERS  . Colonoscopy  DEC 2011    ILEO-COLONIC ULCERS  . Flexible sigmoidoscopy N/A 12/11/2013    Procedure: FLEXIBLE SIGMOIDOSCOPY;  Surgeon: Danie Binder, MD;  [facility];  Service: Endoscopy;  Laterality: N/A;  1:45 PM  . Cystoscopy with retrograde pyelogram, ureteroscopy and stent placement Left 02/27/2014    Procedure: CYSTOSCOPY WITH RETROGRADE PYELOGRAM, URETEROSCOPY/ STENT PLACEMENT;  Surgeon: Alexis Frock, MD;  Location[facility];  Service: Urology;  Laterality: Left;  . Holmium laser application Left 01/02/3663    Procedure: HOLMIUM LASER APPLICATION;  Surgeon: Alexis Frock, MD;  Location[facility];   Service: Urology;  Laterality: Left;  . Colonoscopy N/A 03/16/2015    SLF: 1/ The examined terminal ileum appeared to be normal 2. The left colon is redundatnt 3. Rectal bleeding due to small internal hemorrhoids  . Esophagogastroduodenoscopy N/A 03/16/2015    SLF: 1. epigastric pain most likely due to gastritis/pyschosocial stressors, less likely GERD. 2. Mild non-erosive gastritis.    Allergies  Allergen Reactions  . Morphine Hives   Current Outpatient Prescriptions  Medication Sig Dispense Refill  . azathioprine (IMURAN) 100 MG tablet Take 1 tablet (100 mg total) by mouth daily.    .      . inFLIXimab (REMICADE) 100 MG injection Inject 300 mg into the vein every 8 (eight) weeks. LAST INJECTION JUL 2016    . medroxyPROGESTERone (DEPO-SUBQ PROVERA) 104 MG/0.65ML injection Inject 104 mg into the skin every 3 (three) months.     .      .      .      .       Review of Systems     Objective:   Physical Exam  Constitutional: She is oriented to person, place, and time. She appears well-developed and well-nourished. No distress.  HENT:  Head: Normocephalic and atraumatic.  Mouth/Throat: Oropharynx is clear and moist. No oropharyngeal exudate.  NO ULCERS  Eyes: Pupils are equal, round, and reactive to light.  No scleral icterus.  Neck: Normal range of motion. Neck supple.  Cardiovascular: Normal rate, regular rhythm and normal heart sounds.   Pulmonary/Chest: Effort normal and breath sounds normal. No respiratory distress.  Abdominal: Soft. Bowel sounds are normal. She exhibits no distension. There is no tenderness.  Musculoskeletal: She exhibits no edema.  Lymphadenopathy:    She has no cervical adenopathy.  Neurological: She is alert and oriented to person, place, and time.  NO FOCAL DEFICITS   Skin:  NO PRETIBIAL LESIONS   Psychiatric: She has a normal mood and affect.  Vitals reviewed.         Assessment & Plan:

## 2015-06-18 NOTE — Assessment & Plan Note (Signed)
SYMPTOMS CONTROLLED/RESOLVED.  CONTINUE REMICADE/IMURAN CALL WITH QUESTIONS OR CONCERNS. FOLLOW UP IN 6 MOS.

## 2015-06-18 NOTE — Progress Notes (Signed)
CC'ED TO PCP 

## 2015-06-19 ENCOUNTER — Ambulatory Visit: Payer: Medicaid Other | Admitting: Gastroenterology

## 2015-07-10 ENCOUNTER — Telehealth: Payer: Self-pay

## 2015-07-10 NOTE — Telephone Encounter (Signed)
[place] IS HAVING PAIN DUE TO FISTULA

## 2015-07-10 NOTE — Telephone Encounter (Signed)
I called pt. She said she has been having abdominal pain and rectal pain this week. The rectal pain is worse after a BM. She had bright red blood on tissue paper once last week. She said when she passes gas, it has a very foul odor and smells like it has bacteria.  She is aware that [name] is at the hospital doing procedures but I am sending the note to her.

## 2015-07-10 NOTE — Telephone Encounter (Signed)
Called patient TO DISCUSS CONCERNS. LAST REMICADE JUL 2016. NOT MISSING DOSES OF IMURAN. USING TYLENOL PRN PAIN.HAVING ABD PAIN, ONE LOOSE STOOL, SML VOLUME RECTAL BLEEDING, AND RECTAL PAIN. JUST MOVED BACK TO [facility]. COOKING FOR HERSELF WHICH INCLUDES GROUND BEEF AND FRIED CHICKEN. RECOMMENDED PALEO DIET AND GOING TO [email or web address] TO GET DIET RECOMMENDATIONS. USE PREP H PRN RECTAL PAIN/BLEEDING. SEE DR. Genene Churn IF RECTAL DISCOMFORT CONTINUES.

## 2015-11-13 ENCOUNTER — Encounter: Payer: Self-pay | Admitting: Gastroenterology

## 2016-02-18 ENCOUNTER — Telehealth: Payer: Self-pay

## 2016-02-18 NOTE — Telephone Encounter (Signed)
PT called back and left Vm that the other medication the doctor called in for her was Amitriptyline.  She is to take 50 mg at bedtime nightly.

## 2016-02-18 NOTE — Telephone Encounter (Signed)
Pt's mom, Kendra Franklin, called and said pt has missed the last couple of days of class because of abdominal pain. She could not give me any specifics and I called the pt [phone]). She said her abdomen has been hurting constantly in the belly button area for the last couple of days. She rates the pain now at a 6. She has had a little nausea but no vomiting. No diarrhea or constipation.. The GI doctor in [place] tried her on gabapentine and then took her off. She is now on Lyrica 59m and takes 3 pills daily. She said the doctor is talking about putting her on another medication in addition to the Lyrica and she is afraid it will be too much. She does not know the name of the medication. She just wanted to let Dr. FOneida Alarknow and see if she has recommendations. She is aware Dr. FOneida Alaris seeing pt's today, but she would like a phone call from Dr. FOneida Alarwhen she can.

## 2016-02-22 ENCOUNTER — Telehealth: Payer: Self-pay | Admitting: Gastroenterology

## 2016-02-22 NOTE — Telephone Encounter (Signed)
Pt's mom called back today ( see phone call 02/22/2016). I called pt and she said she is not any better. She started the Amitriptyline 50 mg that the Gi doctor there gave her. It made her so sleepy she has called them and they are lowering the dose to 25 mg. She said she is still having the abdominal pain and rates her pain at a 7 now. I told her that I have not heard from [name], she was off on Friday and today. But I told her she should discuss with the GI doctor in [place] since she is there. I told her that if her pain got severe to go to the ED.  Routing to Laban Emperor, NP for FYI in Dr. Nona Dell absence today.

## 2016-02-22 NOTE — Telephone Encounter (Signed)
PATIENT MOTHER CALLED ABOUT DAUGHTER NOT FEELING GOOD, STILL HAS NOT HEARD BACK   [phone]    Kendra Franklin [phone]

## 2016-02-22 NOTE — Telephone Encounter (Signed)
See note dated 02/18/2016.

## 2016-02-23 ENCOUNTER — Other Ambulatory Visit: Payer: Self-pay

## 2016-02-23 DIAGNOSIS — R109 Unspecified abdominal pain: Secondary | ICD-10-CM

## 2016-02-23 DIAGNOSIS — R1084 Generalized abdominal pain: Secondary | ICD-10-CM

## 2016-02-23 DIAGNOSIS — K50119 Crohn's disease of large intestine with unspecified complications: Secondary | ICD-10-CM

## 2016-02-23 DIAGNOSIS — K50913 Crohn's disease, unspecified, with fistula: Secondary | ICD-10-CM

## 2016-02-23 MED ORDER — AMITRIPTYLINE HCL 10 MG PO TABS: ORAL_TABLET | ORAL | Status: DC

## 2016-02-23 NOTE — Telephone Encounter (Signed)
PT called and is aware of the lab orders and CT. She said she cannot come in until tomorrow, she is in class today. She will do the labs tomorrow and the CT on 02/26/2016. She is aware to pick up contrast for the CT tomorrow and to be fasting for the CT.

## 2016-02-23 NOTE — Telephone Encounter (Signed)
Called patient TO DISCUSS COMPLAINT. LVM-CALL [phone] TO DISCUSS. BEEN IN PAIN SINCE WED. ALL AROUND HER BELLY BUTTON. PASSING STOOL MAKES IT WORSE. FISTULA OK. STILL REMICADE Q8 WEEKS. ON IMURAN. TRYING TO FIGURE OUT WHAT TO DO FOR TRIGGER POINTS. BAD SIDE EFFECTS WITH NEURONTIN. TRIED LYRICA AND NO PAIN CONTROLLED. STARTED 50 MG QHS & GOT REAL SLEEPY FOR TWO DAYS. RECOMMENDED CT SCAN ABD/PELVIS, DX: ABDOMINAL PAIN, CROHN'S DISEASE & CMP/CBC/UA TODAY IN [place]. PT WILL COME FROM [facility] FOR AN EVALUATION. PT INSTRUCTED TO CALL AT 0900 FOR SPECIFIC INSTRUCTIONS.Marland Kitchen

## 2016-02-23 NOTE — Telephone Encounter (Signed)
Have pt set up for CT on 02/26/16 @ 10:00 and she will need to pick up her contrast.

## 2016-02-23 NOTE — Telephone Encounter (Signed)
Labs have been entered for pt to do at [facility] today.

## 2016-02-23 NOTE — Telephone Encounter (Signed)
REVIEWED. PLEASE CALL PT. RX SENT.

## 2016-02-23 NOTE — Telephone Encounter (Signed)
LMOM that Rx was sent to [facility] in [place].

## 2016-02-24 LAB — CBC WITH DIFFERENTIAL/PLATELET
Basophils Absolute: 0 10*3/uL (ref 0.0–0.1)
Basophils Relative: 1 % (ref 0–1)
EOS ABS: 0.1 10*3/uL (ref 0.0–0.7)
EOS PCT: 2 % (ref 0–5)
HEMATOCRIT: 40.8 % (ref 36.0–46.0)
Hemoglobin: 14.3 g/dL (ref 12.0–15.0)
Lymphocytes Relative: 26 % (ref 12–46)
Lymphs Abs: 0.9 10*3/uL (ref 0.7–4.0)
MCH: 34.5 pg — ABNORMAL HIGH (ref 26.0–34.0)
MCHC: 35 g/dL (ref 30.0–36.0)
MCV: 98.3 fL (ref 78.0–100.0)
MPV: 9.6 fL (ref 8.6–12.4)
Monocytes Absolute: 0.3 10*3/uL (ref 0.1–1.0)
Monocytes Relative: 9 % (ref 3–12)
Neutro Abs: 2.2 10*3/uL (ref 1.7–7.7)
Neutrophils Relative %: 62 % (ref 43–77)
Platelets: 315 10*3/uL (ref 150–400)
RBC: 4.15 MIL/uL (ref 3.87–5.11)
RDW: 14.4 % (ref 11.5–15.5)
WBC: 3.5 10*3/uL — AB (ref 4.0–10.5)

## 2016-02-24 LAB — COMPLETE METABOLIC PANEL WITH GFR
ALT: 20 U/L (ref 6–29)
AST: 23 U/L (ref 10–30)
Albumin: 4.2 g/dL (ref 3.6–5.1)
Alkaline Phosphatase: 56 U/L (ref 33–115)
BUN: 9 mg/dL (ref 7–25)
CHLORIDE: 105 mmol/L (ref 98–110)
CO2: 26 mmol/L (ref 20–31)
Calcium: 9.8 mg/dL (ref 8.6–10.2)
Creat: 0.65 mg/dL (ref 0.50–1.10)
GFR, Est African American: >89 mL/min (ref >=60)
GFR, Est Non African American: >89 mL/min (ref >=60)
Glucose, Bld: 96 mg/dL (ref 65–99)
POTASSIUM: 4.3 mmol/L (ref 3.5–5.3)
Sodium: 140 mmol/L (ref 135–146)
Total Bilirubin: 0.6 mg/dL (ref 0.2–1.2)
Total Protein: 7.7 g/dL (ref 6.1–8.1)

## 2016-02-25 LAB — URINALYSIS
BILIRUBIN URINE: NEGATIVE
GLUCOSE, UA: NEGATIVE
Hgb urine dipstick: NEGATIVE
Ketones, ur: NEGATIVE
Leukocytes, UA: NEGATIVE
NITRITE: NEGATIVE
PH: 7 (ref 5.0–8.0)
PROTEIN: NEGATIVE
SPECIFIC GRAVITY, URINE: 1.022 (ref 1.001–1.035)

## 2016-02-26 ENCOUNTER — Ambulatory Visit (HOSPITAL_COMMUNITY)
Admission: RE | Admit: 2016-02-26 | Discharge: 2016-02-26 | Disposition: A | Discharge status: Home / Self Care | Payer: Medicaid Other | Source: Ambulatory Visit | Attending: Gastroenterology | Admitting: Gastroenterology

## 2016-02-26 DIAGNOSIS — R93422 Abnormal radiologic findings on diagnostic imaging of left kidney: Secondary | ICD-10-CM | POA: Insufficient documentation

## 2016-02-26 DIAGNOSIS — N200 Calculus of kidney: Secondary | ICD-10-CM | POA: Diagnosis not present

## 2016-02-26 DIAGNOSIS — K50913 Crohn's disease, unspecified, with fistula: Secondary | ICD-10-CM | POA: Diagnosis not present

## 2016-02-26 DIAGNOSIS — R1084 Generalized abdominal pain: Secondary | ICD-10-CM | POA: Insufficient documentation

## 2016-02-26 DIAGNOSIS — R932 Abnormal findings on diagnostic imaging of liver and biliary tract: Secondary | ICD-10-CM | POA: Diagnosis not present

## 2016-02-26 MED ORDER — IOHEXOL 300 MG/ML  SOLN
100.0000 mL | Freq: Once | INTRAMUSCULAR | Status: AC | PRN
  Administered 2016-02-26: 100 mL via INTRAVENOUS

## 2016-02-26 NOTE — Telephone Encounter (Signed)
PLEASE CALL PT. HER LABS ARE NORMAL. HIS URINE TEST IS NORMAL. WE WILL CALL HER WITH HER CT RESULTS WHEN IT IS READ.

## 2016-02-26 NOTE — Telephone Encounter (Signed)
Pt is aware.  

## 2016-02-29 ENCOUNTER — Other Ambulatory Visit: Payer: Self-pay | Admitting: Gastroenterology

## 2016-03-13 NOTE — Telephone Encounter (Signed)
PLEASE CALL PT. HER CT SHOWS KIDNEY STONES AND KIDNEY CYSTS. NO OBVIOUS REASON FOR HER ABDOMINAL PAIN WAS IDENTIFIED. SHE NEEDS A REPEAT CT ABD/PLEVIS IN 6 MOS TO LOOK AT THE CYST ON HER KIDNEYS.

## 2016-03-14 NOTE — Telephone Encounter (Signed)
Pt is aware.  

## 2016-03-14 NOTE — Telephone Encounter (Signed)
Reminder in epic °

## 2016-03-29 ENCOUNTER — Telehealth: Payer: Self-pay

## 2016-03-29 DIAGNOSIS — E7253 Hyperoxaluria: Secondary | ICD-10-CM | POA: Diagnosis not present

## 2016-03-29 DIAGNOSIS — R8299 Other abnormal findings in urine: Secondary | ICD-10-CM | POA: Diagnosis not present

## 2016-03-29 DIAGNOSIS — N200 Calculus of kidney: Secondary | ICD-10-CM | POA: Diagnosis not present

## 2016-03-29 DIAGNOSIS — Z0000 Encounter for general adult medical examination without abnormal findings: Secondary | ICD-10-CM | POA: Diagnosis not present

## 2016-03-29 NOTE — Telephone Encounter (Signed)
Pt called this morning. She said she was home for the summer and wanted to know if she could get her remicade infusions at [facility] instead of going back to [place]. I called [place] Medicaid and spoke with Davy Pique- she said they will only pay for the infusions at one hospital, either here or [facility], but they wont pay for both. I called the pt and explained this to her and she said she understood and will get them in [place]. Routing to [facility] as an [place]

## 2016-03-29 NOTE — Telephone Encounter (Signed)
REVIEWED. AGREE. NO ADDITIONAL RECOMMENDATIONS. 

## 2016-03-30 DIAGNOSIS — N898 Other specified noninflammatory disorders of vagina: Secondary | ICD-10-CM | POA: Diagnosis not present

## 2016-03-30 DIAGNOSIS — Z6824 Body mass index (BMI) 24.0-24.9, adult: Secondary | ICD-10-CM | POA: Diagnosis not present

## 2016-04-04 ENCOUNTER — Telehealth: Payer: Self-pay | Admitting: Gastroenterology

## 2016-04-04 NOTE — Telephone Encounter (Signed)
Pt called to say that she has Medicare now, but doesn't have the card. It went in affect on Mar 28, 2016.  She said that her pharmacist in [place] would be faxing her prescription over to [place] and she was out of mediation and if we could get that refill in as soon as possible. I told her that I would give the prescription refill to the nurse as soon as I get it and let her be aware of the Insurance change.

## 2016-04-05 NOTE — Telephone Encounter (Signed)
Noted  

## 2016-04-06 DIAGNOSIS — K5080 Crohn's disease of both small and large intestine without complications: Secondary | ICD-10-CM | POA: Diagnosis not present

## 2016-04-21 ENCOUNTER — Other Ambulatory Visit: Payer: Self-pay

## 2016-04-21 ENCOUNTER — Encounter: Payer: Self-pay | Admitting: Gastroenterology

## 2016-04-21 ENCOUNTER — Ambulatory Visit (INDEPENDENT_AMBULATORY_CARE_PROVIDER_SITE_OTHER): Payer: Medicare Other | Admitting: Gastroenterology

## 2016-04-21 VITALS — BP 119/80 | HR 104 | Temp 99.4°F | Ht 64.0 in | Wt 139.6 lb

## 2016-04-21 DIAGNOSIS — K50913 Crohn's disease, unspecified, with fistula: Secondary | ICD-10-CM | POA: Diagnosis not present

## 2016-04-21 MED ORDER — AMITRIPTYLINE HCL 10 MG PO TABS: ORAL_TABLET | ORAL | Status: DC

## 2016-04-21 NOTE — Addendum Note (Signed)
Addended by: Danie Binder on: 04/21/2016 11:52 AM   Modules accepted: Orders

## 2016-04-21 NOTE — Progress Notes (Signed)
Subjective:    Patient ID: Kendra Franklin, female    DOB: 10-04-1991, 25 y.o.   MRN: [id number]  [name], MD  HPI Was feeling ok. Now not okay since last visit. Having more abdominal pain. Fistula leaking more. It only leaked 3 times but those three time she was active and sweaty. abdominal pain: sharp/achy-all the time AROUND [place]. NO RADIATION. NO CHANGE IN STOOL OR NOCTURNAL STOOL. BMs: SOFT. APPETITE: GOOD. ENERGY LEVEL: GOOD. LAST DIARRHEA 3 WEEKS-FOR ONE DAY. NO SORES IN MOUTH, RASH ON LEGS, EYE PAIN, JOINT PAIN, OR BACK PAIN. PT DENIES FEVER, CHILLS, HEMATOCHEZIA, HEMATEMESIS, nausea, vomiting, melena, diarrhea, CHEST PAIN, SHORTNESS OF BREATH, CHANGE IN BOWEL IN HABITS, constipation, problems swallowing, problems with sedation, OR heartburn or indigestion. DIDN'T THINK ELAVIL HELPED BUT ONLY TOOK FOR 3 DAYS. NOW SEXUALLY ACTIVE. USING PROTECTION-MOST OF THE TIME. NO DYSURIA, HEMATURIA, OR ABNORMAL  VAGINAL DISCHARGE. LAST PAP/PELVIC LAST YEAR. PAIN NOT WORSE WITH SEXUAL ACTIVITY. PAIN WORSE WITH JEANS AND CLOTHES ARE TIGHT. PAIN MAY MAKE KEP HER AWAKE AT NIGHT. MAYBE FOUR NIGHTS A WEEK.   PLAN [place] IN JUN 2017 & [place] IN AUG 2017. NOW HAS A BOYFRIEND-BRANDON(RED).   Past Medical History  Diagnosis Date  . Crohn's     DX   DEC 2011--- ASCA 100 pANCA NEG-APR 2012 6-TGN 233(LLN 230)  6-MMPN 1173 ON 75 MG IMURAN  . History of anal fissures   . History of anal lesion     2009   NON--HEALING  . Crohn's disease, small and large intestine   . History of small intestine ulcer     2011  . History of kidney stones    Past Surgical History  Procedure Laterality Date  . Incision and drainage perirectal abscess  2009  &  2010  . Givens capsule study  DEC 2011    SB ULCERS  . Colonoscopy  DEC 2011    ILEO-COLONIC ULCERS  . Flexible sigmoidoscopy N/A 12/11/2013    Procedure: FLEXIBLE SIGMOIDOSCOPY;  Surgeon: Danie Binder, MD;  [facility];  Service:  Endoscopy;  Laterality: N/A;  1:45 PM  . Cystoscopy with retrograde pyelogram, ureteroscopy and stent placement Left 02/27/2014    Procedure: CYSTOSCOPY WITH RETROGRADE PYELOGRAM, URETEROSCOPY/ STENT PLACEMENT;  Surgeon: Alexis Frock, MD;  Location: [facility];  Service: Urology;  Laterality: Left;  . Holmium laser application Left 01/06/9241    Procedure: HOLMIUM LASER APPLICATION;  Surgeon: Alexis Frock, MD;  Location: [facility];  Service: Urology;  Laterality: Left;  . Colonoscopy N/A 03/16/2015    SLF: 1/ The examined terminal ileum appeared to be normal 2. The left colon is redundatnt 3. Rectal bleeding due to small internal hemorrhoids  . Esophagogastroduodenoscopy N/A 03/16/2015    SLF: 1. epigastric pain most likely due to gastritis/pyschosocial stressors, less likely GERD. 2. Mild non-erosive gastritis.    Allergies  Allergen Reactions  . Morphine Hives   Current Outpatient Prescriptions  Medication Sig Dispense Refill  . azathioprine 100 MG tablet Take 1 tablet (100 mg total) by mouth daily.    Marland Kitchen REMICADE 100 MG injection Inject 300 mg into the vein every 8 (eight) weeks. LAST INJECTION MAY 2017    . DEPO-SUBQ PROVERA Inject 104 mg into the skin every 3 (three) months. LAST INJECTION APR 2017    . GLYCOLAX/MIRALAX powder Take 17 g by mouth daily. As needed.      Review of Systems PER HPI OTHERWISE ALL  SYSTEMS ARE NEGATIVE.    Objective:   Physical Exam  Constitutional: She is oriented to person, place, and time. She appears well-developed and well-nourished. No distress.  HENT:  Head: Normocephalic and atraumatic.  Mouth/Throat: Oropharynx is clear and moist. No oropharyngeal exudate.  NO LESIONS ON BUCCAL MUCOSA  Eyes: Pupils are equal, round, and reactive to light. No scleral icterus.  Neck: Normal range of motion. Neck supple.  Cardiovascular: Normal rate, regular rhythm and normal heart sounds.   Pulmonary/Chest: Effort normal and breath  sounds normal. No respiratory distress.  Abdominal: Soft. Bowel sounds are normal. She exhibits no distension. There is tenderness. There is no rebound and no guarding.  MILD TTP x4  Musculoskeletal: She exhibits no edema.  Lymphadenopathy:    She has no cervical adenopathy.  Neurological: She is alert and oriented to person, place, and time.  NO  NEW FOCAL DEFICITS  Skin:  NO PRETIBIAL LESIONS  Psychiatric: She has a normal mood and affect.  Vitals reviewed.     Assessment & Plan:

## 2016-04-21 NOTE — Progress Notes (Signed)
ON RECALL  °

## 2016-04-21 NOTE — Patient Instructions (Addendum)
See DR. GALLOWAY JUN 13 @1045  AM.  COMPLETE CT SCAN.  CONTINUE IMURAN AND REMICADE.  ADD ELAVIL 20 MG AT BEDTIME.  FOLLOW UP IN AUG 2017.

## 2016-04-21 NOTE — Assessment & Plan Note (Addendum)
SYMPTOMS NOT IDEALLY CONTROLLED. FISTULA RARELY ACTIVE BUT NOT TODAY. DIFFERENTIAL DIAGNOSIS INCLUDES: FISTULA, ADHESIONS, FUNCTIONAL ABDOMINAL PAIN, PID, LESS LIKELY ACTIVE CROHN'S DISEASE.   See DR. GALLOWAY JUN 13 @1045  AM- FOR PAP/CHLAMYDIA AND GC SWAB. COMPLETE CT ENTEROGRAPHY. EXPLAINED PROCEDURE TO PT. IF ACTIVE DISEASE, CHECK REMICADE ABs AND IMURAN METABOLITIES. ADJUST REMICADE OR IMURAN DOSE. CONTINUE IMURAN AND REMICADE. ADD ELAVIL 20 MG QHS. FOLLOW UP IN AUG 2017.

## 2016-04-26 NOTE — Progress Notes (Signed)
CC'D TO PCP °

## 2016-04-29 ENCOUNTER — Ambulatory Visit (HOSPITAL_COMMUNITY)
Admission: RE | Admit: 2016-04-29 | Discharge: 2016-04-29 | Disposition: A | Discharge status: Home / Self Care | Payer: Medicare Other | Source: Ambulatory Visit | Attending: Gastroenterology | Admitting: Gastroenterology

## 2016-04-29 DIAGNOSIS — Z8719 Personal history of other diseases of the digestive system: Secondary | ICD-10-CM | POA: Diagnosis not present

## 2016-04-29 DIAGNOSIS — K50913 Crohn's disease, unspecified, with fistula: Secondary | ICD-10-CM | POA: Insufficient documentation

## 2016-04-29 DIAGNOSIS — N200 Calculus of kidney: Secondary | ICD-10-CM | POA: Insufficient documentation

## 2016-04-29 DIAGNOSIS — N281 Cyst of kidney, acquired: Secondary | ICD-10-CM | POA: Insufficient documentation

## 2016-04-29 MED ORDER — IOPAMIDOL (ISOVUE-300) INJECTION 61%
125.0000 mL | Freq: Once | INTRAVENOUS | Status: AC | PRN
  Administered 2016-04-29: 125 mL via INTRAVENOUS

## 2016-05-01 ENCOUNTER — Telehealth: Payer: Self-pay | Admitting: Gastroenterology

## 2016-05-01 NOTE — Telephone Encounter (Signed)
PLEASE CALL PT. HER CT DID NOT SHOW ACTIVE DISEASE OR A FISTULA. SHE HAD MILD CHANGES IN HER RECTAL AREA BUT NO TRACT. THIS MAY BE RELATED TO HER ACTIVITIES WITH HER BOYFRIEND. SHE SHOULD SIGN UP FOR MY CHART AS WELL.   See SHOULD SEE DR. GALLOWAY JUN 13 @1045  AM. SHE NEEDS A PAP AND SWABS OF HER CERVIX.  CONTINUE IMURAN AND REMICADE.  TAKE ELAVIL 20 MG QHS.  FOLLOW UP IN AUG 2017 W/ SLF E30 ABDOMINAL PAIN/CROHN'S DISEASE.

## 2016-05-02 NOTE — Telephone Encounter (Signed)
Pt is aware.  

## 2016-05-02 NOTE — Telephone Encounter (Signed)
ON RECALL  °

## 2016-05-09 ENCOUNTER — Other Ambulatory Visit: Payer: Self-pay | Admitting: Gastroenterology

## 2016-05-24 DIAGNOSIS — Z3202 Encounter for pregnancy test, result negative: Secondary | ICD-10-CM | POA: Diagnosis not present

## 2016-05-24 DIAGNOSIS — Z3042 Encounter for surveillance of injectable contraceptive: Secondary | ICD-10-CM | POA: Diagnosis not present

## 2016-06-01 ENCOUNTER — Encounter: Payer: Self-pay | Admitting: Gastroenterology

## 2016-06-01 ENCOUNTER — Telehealth: Payer: Self-pay

## 2016-06-01 NOTE — Telephone Encounter (Signed)
Pt left a VM that she needed me to call her ASAP. I called and she said she was originally scheduled for her Remicade in [place] today, and when she called to see what time she needed to be there, she was not on the schedule and then was told she is not on the schedule for any more Remicades. She said she had wanted to change to [facility] but Medicaid would not let her, she has to have the Remicade at one place within a year.  She said her doctor in [place], Dr. Loma Newton, at [place], cancelled her Remicade because she has failed to keep appts with him.  I told her that she needs to call his office and take it up with him since she has been having them there.   She is also aware that [name] is out of the office this week and next week.

## 2016-06-02 NOTE — Telephone Encounter (Signed)
Pt's insurance does not need a PA for the Remicade.

## 2016-06-02 NOTE — Telephone Encounter (Signed)
PLEASE CALL PT's MEDICAID OFC. SEE HOW SHE CAN GET HER REMICADE AT [facility].

## 2016-06-03 NOTE — Telephone Encounter (Signed)
Will she need any pre-meds?

## 2016-06-03 NOTE — Telephone Encounter (Signed)
PT NEEDS REMICADE 5 mg/kg q8 weeks at [facility]. SEE NOTE BELOW TO CLEAR MED WITH MEDICAID ASAP.

## 2016-06-06 NOTE — Telephone Encounter (Signed)
PLEASE CALL PT. IF SHE WAS GETTING PREMEDS, ORDER THEM.

## 2016-06-06 NOTE — Telephone Encounter (Signed)
Pt called Medicaid and they said they can change her back to [place] to do the Remicade but it will be the end of the month before it will be changed.  She said she has appt to see the doctor in [place] and is going to be set up for Remicade there this month.  She will call and let us know after that so we can get her scheduled for the Remicade at [facility].

## 2016-06-06 NOTE — Telephone Encounter (Signed)
I called pt and she said she DID NOT have pre meds in [place]. Per Almyra Free, [place] had told her that pt had to decide if she was going to have Remicade here or [place]. Ginger called and said Medicaid does not require a PA.  I asked the pt to call Medicaid and let them know that she is moving her Remicade treatments back here so they can  let her know if that is OK for now. She said she will call them and let me know so we can get this scheduled.

## 2016-06-07 DIAGNOSIS — R1030 Lower abdominal pain, unspecified: Secondary | ICD-10-CM | POA: Diagnosis not present

## 2016-06-07 DIAGNOSIS — R1031 Right lower quadrant pain: Secondary | ICD-10-CM | POA: Diagnosis not present

## 2016-06-07 DIAGNOSIS — K5090 Crohn's disease, unspecified, without complications: Secondary | ICD-10-CM | POA: Diagnosis not present

## 2016-06-07 DIAGNOSIS — Z87442 Personal history of urinary calculi: Secondary | ICD-10-CM | POA: Diagnosis not present

## 2016-06-07 DIAGNOSIS — N200 Calculus of kidney: Secondary | ICD-10-CM | POA: Diagnosis not present

## 2016-06-07 DIAGNOSIS — R112 Nausea with vomiting, unspecified: Secondary | ICD-10-CM | POA: Diagnosis not present

## 2016-06-07 NOTE — Telephone Encounter (Signed)
REVIEWED. AGREE. NO ADDITIONAL RECOMMENDATIONS. 

## 2016-06-08 DIAGNOSIS — R1033 Periumbilical pain: Secondary | ICD-10-CM | POA: Diagnosis not present

## 2016-06-08 DIAGNOSIS — K5080 Crohn's disease of both small and large intestine without complications: Secondary | ICD-10-CM | POA: Diagnosis not present

## 2016-06-20 ENCOUNTER — Other Ambulatory Visit: Payer: Self-pay | Admitting: Gastroenterology

## 2016-07-28 ENCOUNTER — Encounter: Payer: Self-pay | Admitting: Gastroenterology

## 2016-08-10 ENCOUNTER — Ambulatory Visit: Payer: Medicare Other | Admitting: Gastroenterology

## 2016-08-10 DIAGNOSIS — Z3042 Encounter for surveillance of injectable contraceptive: Secondary | ICD-10-CM | POA: Diagnosis not present

## 2016-08-10 DIAGNOSIS — K5090 Crohn's disease, unspecified, without complications: Secondary | ICD-10-CM | POA: Diagnosis not present

## 2016-09-06 ENCOUNTER — Other Ambulatory Visit: Payer: Self-pay | Admitting: Gastroenterology

## 2016-09-13 ENCOUNTER — Telehealth: Payer: Self-pay | Admitting: Gastroenterology

## 2016-09-13 NOTE — Telephone Encounter (Signed)
I spoke to pt and she said she is having a lot of stress in her life and she needs [name] to write a letter for her. She really would like to discuss the situation with [name]. The stress in her life is causing her a lot of physical problems.  She can be reached at [phone].

## 2016-09-13 NOTE — Telephone Encounter (Signed)
Pt had called on Monday afternoon asking to speak with SF or her nurse. I told her neither one were in the office and I could take a message. She said to just have the nurse call her at [phone]

## 2016-09-15 NOTE — Telephone Encounter (Signed)
Called patient TO DISCUSS CONCERNS. LVM-CALL [phone] TO DISCUSS.

## 2016-09-28 DIAGNOSIS — N898 Other specified noninflammatory disorders of vagina: Secondary | ICD-10-CM | POA: Diagnosis not present

## 2016-09-28 DIAGNOSIS — A6000 Herpesviral infection of urogenital system, unspecified: Secondary | ICD-10-CM | POA: Diagnosis not present

## 2016-10-04 ENCOUNTER — Other Ambulatory Visit: Payer: Self-pay

## 2016-10-04 MED ORDER — AZATHIOPRINE 50 MG PO TABS: ORAL_TABLET | ORAL | 3 refills | Status: DC

## 2016-10-05 ENCOUNTER — Telehealth: Payer: Self-pay | Admitting: Gastroenterology

## 2016-10-05 NOTE — Telephone Encounter (Signed)
Pt called asking to speak with SF or nurse. I told her SF was with patients and SF nurse wouldn't be back until tomorrow. She asked to speak with another nurse. Call transferred to [facility] VM. [phone]

## 2016-10-06 NOTE — Telephone Encounter (Signed)
Routing to Doris 

## 2016-10-06 NOTE — Telephone Encounter (Signed)
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 She no longer has the pain today, said it always last for a day and then it is gone. She is aware that [name] is off til next Wednesday. She will call if she has the pain again before then.  Sending to Neil Crouch, PA for FYI in [name] absence.

## 2016-10-07 NOTE — Telephone Encounter (Signed)
Review. No recommendations. Save for [name].

## 2016-10-10 DIAGNOSIS — K5080 Crohn's disease of both small and large intestine without complications: Secondary | ICD-10-CM | POA: Diagnosis not present

## 2016-10-12 ENCOUNTER — Telehealth: Payer: Self-pay

## 2016-10-12 NOTE — Telephone Encounter (Signed)
Pt said she is having another episode of weakness and shakiness. No specific pain. Eating and drinking well. BM's normal. I asked who her PCP was and she said she has one in [place] ( and she is there now) but she did not want to talk to anyone else since [name] is familiar with her problems. I told her if the weakness and shakiness gets worse she should go to the ED if she cannot see a physician out there.

## 2016-10-13 ENCOUNTER — Other Ambulatory Visit: Payer: Self-pay

## 2016-10-13 DIAGNOSIS — R531 Weakness: Secondary | ICD-10-CM

## 2016-10-13 DIAGNOSIS — R200 Anesthesia of skin: Secondary | ICD-10-CM

## 2016-10-13 MED ORDER — NITROGLYCERIN 0.4 % RE OINT: 1.0000 [drp] | TOPICAL_OINTMENT | Freq: Three times a day (TID) | RECTAL | 1 refills | Status: DC

## 2016-10-13 NOTE — Telephone Encounter (Addendum)
Called patient TO DISCUSS CONCERNS. USED THE BATHROOM AND IT WAS REALLY HORRIBLE-BUTT THROBBING AND BURNING. WORSE CASE EVER LAST WEEK. MAY HAVE A LIQUID BM.  THIS PAST WEEK-LEGS AN ARMS ARE NUMB. CAN BARELY HOLD PHONE TO HER EAR FOR 2-3 HOURS. HAPPENED 2X A WEEK AND IN BETWEEN EPISODE FEELS FINE. STARTED SAT, AND WED. LAST REMICADE MON. HAPPENED TWICE IN THE PAST WEEK. STILL IN CLT. GRADUATES IN MAY 2018 WITH SOCIAL WORK AND OPEN UP A DAY CARE. BEEN ON REMICADE OVER 5 YEARS. CT SCAN OF HEAD NEXT WED NOV 22 AND REFER TO NEUROLOGY ASAP, Dx: WEAKNESS AND NUMBNESS. BMs: 2X/DAY-SOFT. RARE DIARRHEA: 2X/PAST MO.  SEND RX FOR RECTIV. DISCUSSED SIDE EFFECTS OF MEDS: HA/HYPOTN. NEEDS OPV E30 CROHN'S IN JAN 2018 WITH DR Marisal Swarey. PT NEEDS TO RE-ESTABLISH CARE.  PT DENIES FEVER, CHILLS, HEMATOCHEZIA, HEMATEMESIS, nausea, vomiting, melena, CHEST PAIN, SHORTNESS OF BREATH,  CHANGE IN BOWEL IN HABITS, constipation, abdominal pain, problems swallowing, OR heartburn or indigestion.

## 2016-10-13 NOTE — Telephone Encounter (Signed)
CT of head scheduled for 10/19/16 at 8:00am. Referral info faxed to Dr. Merlene Laughter (neurology).

## 2016-10-13 NOTE — Addendum Note (Signed)
Addended by: Danie Binder on: 10/13/2016 01:03 PM   Modules accepted: Orders

## 2016-10-13 NOTE — Telephone Encounter (Signed)
ON RECALL  °

## 2016-10-13 NOTE — Telephone Encounter (Signed)
T/C from [place] [facility] at [address] in [place], said the [place] Lighter is not covered by [employer]. It is a non formulary drug. Please advise and the call back number is [phone].

## 2016-10-13 NOTE — Telephone Encounter (Signed)
SEE PRIOR TC. 

## 2016-10-14 ENCOUNTER — Telehealth: Payer: Self-pay | Admitting: Gastroenterology

## 2016-10-14 NOTE — Telephone Encounter (Signed)
Raquel Sarna from Dr Freddie Apley office called regarding a referral sent to them on patient. She said that since patient has medicaid and medicare she would need to be referred by her PCP. The PCP on her medicaid card is a doctor's office in [place] and they would need to be the one to refer patient to them. Any questions call Raquel Sarna at [phone]

## 2016-10-14 NOTE — Telephone Encounter (Signed)
Called and informed pt CT of Head 10/19/16 at 8:00am. Arrive at [facility] at 7:45am. Referral was faxed to [facility] per CM, since Dr. Freddie Apley office required referral by PCP.

## 2016-10-14 NOTE — Telephone Encounter (Signed)
Referral info faxed to [facility] per CM.

## 2016-10-18 ENCOUNTER — Other Ambulatory Visit: Payer: Self-pay

## 2016-10-18 NOTE — Telephone Encounter (Signed)
Pt and Anderson Malta at the pharmacy are aware. Pt said it would costs over $700.00.

## 2016-10-18 NOTE — Telephone Encounter (Signed)
REVIEWED-NO ADDITIONAL RECOMMENDATIONS. 

## 2016-10-18 NOTE — Telephone Encounter (Signed)
Received fax from [facility]. Pt has appt 11/14/16 at 11:30am with Dr. Jannifer Franklin. Called pt and she was already aware of appt.

## 2016-10-18 NOTE — Telephone Encounter (Signed)
PLEASE CALL PT AND HER PHARMACY. THERE IS NO ALTERNATIVE TO RECTIV.

## 2016-10-19 ENCOUNTER — Ambulatory Visit (HOSPITAL_COMMUNITY)
Admission: RE | Admit: 2016-10-19 | Discharge: 2016-10-19 | Disposition: A | Discharge status: Home / Self Care | Payer: Medicare Other | Source: Ambulatory Visit | Attending: Gastroenterology | Admitting: Gastroenterology

## 2016-10-19 DIAGNOSIS — R200 Anesthesia of skin: Secondary | ICD-10-CM | POA: Insufficient documentation

## 2016-10-19 DIAGNOSIS — R531 Weakness: Secondary | ICD-10-CM | POA: Diagnosis not present

## 2016-10-19 MED ORDER — NITROGLYCERIN 0.4 % RE OINT: 1.0000 [drp] | TOPICAL_OINTMENT | Freq: Three times a day (TID) | RECTAL | 1 refills | Status: DC

## 2016-10-24 NOTE — Progress Notes (Signed)
PT is aware.

## 2016-11-14 ENCOUNTER — Ambulatory Visit: Payer: Medicaid Other | Admitting: Neurology

## 2016-11-14 ENCOUNTER — Telehealth: Payer: Self-pay

## 2016-11-14 NOTE — Telephone Encounter (Signed)
Pt no-showed her new patient appt this morning.

## 2016-11-16 ENCOUNTER — Encounter: Payer: Self-pay | Admitting: Neurology

## 2016-11-25 DIAGNOSIS — Z3202 Encounter for pregnancy test, result negative: Secondary | ICD-10-CM | POA: Diagnosis not present

## 2016-11-25 DIAGNOSIS — Z3042 Encounter for surveillance of injectable contraceptive: Secondary | ICD-10-CM | POA: Diagnosis not present

## 2016-12-01 ENCOUNTER — Ambulatory Visit (INDEPENDENT_AMBULATORY_CARE_PROVIDER_SITE_OTHER): Payer: Medicare Other | Admitting: Gastroenterology

## 2016-12-01 ENCOUNTER — Other Ambulatory Visit: Payer: Self-pay

## 2016-12-01 ENCOUNTER — Encounter: Payer: Self-pay | Admitting: Gastroenterology

## 2016-12-01 ENCOUNTER — Telehealth: Payer: Self-pay

## 2016-12-01 DIAGNOSIS — K601 Chronic anal fissure: Secondary | ICD-10-CM | POA: Diagnosis not present

## 2016-12-01 DIAGNOSIS — R29898 Other symptoms and signs involving the musculoskeletal system: Secondary | ICD-10-CM | POA: Diagnosis not present

## 2016-12-01 DIAGNOSIS — Z8739 Personal history of other diseases of the musculoskeletal system and connective tissue: Secondary | ICD-10-CM

## 2016-12-01 DIAGNOSIS — K50913 Crohn's disease, unspecified, with fistula: Secondary | ICD-10-CM | POA: Diagnosis not present

## 2016-12-01 DIAGNOSIS — K603 Anal fistula: Secondary | ICD-10-CM

## 2016-12-01 DIAGNOSIS — Z9189 Other specified personal risk factors, not elsewhere classified: Secondary | ICD-10-CM

## 2016-12-01 DIAGNOSIS — E559 Vitamin D deficiency, unspecified: Secondary | ICD-10-CM

## 2016-12-01 DIAGNOSIS — M8580 Other specified disorders of bone density and structure, unspecified site: Secondary | ICD-10-CM

## 2016-12-01 DIAGNOSIS — M818 Other osteoporosis without current pathological fracture: Secondary | ICD-10-CM

## 2016-12-01 LAB — VITAMIN B12: Vitamin B-12: 206 pg/mL (ref 200–1100)

## 2016-12-01 LAB — COMPLETE METABOLIC PANEL WITH GFR
ALK PHOS: 46 U/L (ref 33–115)
ALT: 11 U/L (ref 6–29)
AST: 19 U/L (ref 10–30)
Albumin: 4.2 g/dL (ref 3.6–5.1)
BUN: 11 mg/dL (ref 7–25)
CALCIUM: 9.6 mg/dL (ref 8.6–10.2)
CHLORIDE: 105 mmol/L (ref 98–110)
CO2: 19 mmol/L — ABNORMAL LOW (ref 20–31)
CREATININE: 0.68 mg/dL (ref 0.50–1.10)
GFR, Est Non African American: >89 mL/min (ref >=60)
Glucose, Bld: 70 mg/dL (ref 65–99)
POTASSIUM: 4.2 mmol/L (ref 3.5–5.3)
Sodium: 137 mmol/L (ref 135–146)
Total Bilirubin: 1.1 mg/dL (ref 0.2–1.2)
Total Protein: 7.7 g/dL (ref 6.1–8.1)

## 2016-12-01 MED ORDER — NITROGLYCERIN 0.4 % RE OINT: 1.0000 [drp] | TOPICAL_OINTMENT | Freq: Three times a day (TID) | RECTAL | 1 refills | Status: DC

## 2016-12-01 MED ORDER — AMITRIPTYLINE HCL 10 MG PO TABS: ORAL_TABLET | ORAL | 4 refills | Status: DC

## 2016-12-01 NOTE — Assessment & Plan Note (Signed)
SYMPTOMS FAIRLY WELL CONTROLLED. ROUTINE MAINTENANCE REQUIRED.  CONTINUE REMIACDDE AND IMURAN.  COMPLETE THE FLU SHOT.  COMPLETE THE PNEUMONIA SHOTS. IT IS A TWO SHOT SERIES. YOU NEED PREVNAR FIRST THEN IN 8 WEEKS THE PNEUMOVAX.  COMPLETE THE BONE DENSITY SCAN.  COMPLETE BLOOD DRAW FOR: VIT D 25-OH, CBC, CMP, HEP B SURFACE Ab, HEPATITIS A, & VITAMIN B12.  SEE DERMATOLOGY EVERY YEAR FOR A SKIN EXAM WHILE YOU ARE ON REMICADE.  PLEASE CALL WITH QUESTIONS OR CONCERNS.  FOLLOW UP IN 4 MOS.

## 2016-12-01 NOTE — Assessment & Plan Note (Signed)
SYMPTOMS NOT CONTROLLED AND MAY BE INCAPCITATING. NO EVIDENCE FOR PERIRECTAL ABSCESS OR FISTULA.  ADD Nitroglycerin ointment. USE a pea sized amount internally four times daily FOR TWO MONTHS. IT MAY CAUSE HEADACHES OR DROP IN BLOOD PRESSURE. TAKE FIRST DOSE AND REMAIN SEATED OR LAYING DOWN FOR 10-15 MINS. FOLLOW UP IN 4 MOS.

## 2016-12-01 NOTE — Assessment & Plan Note (Signed)
NO FOCAL DEFICITS TODAY SYMPTOMS NOT IDEALLY CONTROLLED & TRANSIENT. CT OF HEAD UNREMARKABLE.  SEE NEUROLOGY. CONTINUE TO MONITOR SYMPTOMS.

## 2016-12-01 NOTE — Patient Instructions (Signed)
FOLLOW UP WITH NEUROLOGY.  ADD Nitroglycerin ointment. USE a pea sized amount internally four times daily FOR TWO MONTHS. IT MAY CAUSE HEADACHES OR DROP IN BLOOD PRESSURE. TAKE FIRST DOSE AND REMAIN SEATED OR LAYING DOWN FOR 10-15 MINS.  COMPLETE THE FLU SHOT.  COMPLETE THE PNEUMONIA SHOTS. IT IS A TWO SHOT SERIES. YOU NEED PREVNAR FIRST THEN IN 8 WEEKS THE PNEUMOVAX.  COMPLETE THE BONE DENSITY SCAN.  COMPLETE BLOOD DRAW FOR: VIT D 25-OH, CBC, CMP, HEP B SURFACE Ab, HEPATITIS A, & VITAMIN B12.  SEE DERMATOLOGY EVERY YEAR FOR A SKIN EXAM WHILE YOU ARE ON REMICADE.  PLEASE CALL WITH QUESTIONS OR CONCERNS.  FOLLOW UP IN 4 MOS.

## 2016-12-01 NOTE — Progress Notes (Signed)
Subjective:    Patient ID: Kendra Franklin, female    DOB: Feb 24, 1991, 26 y.o.   MRN: [id number]  Pending pcp transfer to [place].  HPI WHEN PASSES STOOL, 1 STOOL PASSES STARTS TO HAVE PAIN. IF HAS BM x2 HURTS AFTER AND THEN ALL DAY. NO BLOOD. BMs: QOD. DIDN'T GET NTG FIELD IN NOV 2017. SHARP SHOOTING PAIN AND BURNING AFTER BM. LAST DIARRHEA: ONE MO AGO. NO TROUBLE WITH REMICADE-NEXT DOSE FEB 7. HAS MEDICAID/MEDICARE. STOMACH HURTS IN THE MIDDLE( ACHY,OFF AND ON, NOTICES IT FIRST THINGS IN THE AM(QD), AND IN THE AFTERNOON, LASTS HOURS). SEXUALLY ACTIVE. STILL HAVING ARM AND LEG WEAKNESS-HANDS SHAKING: Q1-2 WEEKS. SYMPTOMS LAST 1-2 HOURS. JUST LAYS DOWN MISSED NEURO-BUTT PAIN AND ABDOMINAL PAIN. NO FLU SHOT YET.  PT DENIES FEVER, CHILLS, HEMATOCHEZIA, HEMATEMESIS, nausea, vomiting, melena, CHEST PAIN, SHORTNESS OF BREATH, CHANGE IN BOWEL IN HABITS, constipation, problems swallowing, OR heartburn or indigestion.  Past Medical History:  Diagnosis Date  . Crohn's    DX   DEC 2011--- ASCA 100 pANCA NEG-APR 2012 6-TGN 233(LLN 230)  6-MMPN 1173 ON 75 MG IMURAN  . Crohn's disease, small and large intestine ([place])   . History of anal fissures   . History of anal lesion    2009   NON--HEALING  . History of kidney stones   . History of small intestine ulcer    2011   Past Surgical History:  Procedure Laterality Date  . COLONOSCOPY  DEC 2011   ILEO-COLONIC ULCERS  . COLONOSCOPY N/A 03/16/2015   SLF: 1/ The examined terminal ileum appeared to be normal 2. The left colon is redundatnt 3. Rectal bleeding due to small internal hemorrhoids  . CYSTOSCOPY WITH RETROGRADE PYELOGRAM, URETEROSCOPY AND STENT PLACEMENT Left 02/27/2014   Procedure: CYSTOSCOPY WITH RETROGRADE PYELOGRAM, URETEROSCOPY/ STENT PLACEMENT;  Surgeon: Alexis Frock, MD;  Location: [facility];  Service: Urology;  Laterality: Left;  . ESOPHAGOGASTRODUODENOSCOPY N/A 03/16/2015   SLF: 1. epigastric pain most likely due to  gastritis/pyschosocial stressors, less likely GERD. 2. Mild non-erosive gastritis.   Marland Kitchen FLEXIBLE SIGMOIDOSCOPY N/A 12/11/2013   Procedure: FLEXIBLE SIGMOIDOSCOPY;  Surgeon: Danie Binder, MD;  [facility];  Service: Endoscopy;  Laterality: N/A;  1:45 PM  . GIVENS CAPSULE STUDY  DEC 2011   SB ULCERS  . HOLMIUM LASER APPLICATION Left 08/05/9210   Procedure: HOLMIUM LASER APPLICATION;  Surgeon: Alexis Frock, MD;  Location: [facility];  Service: Urology;  Laterality: Left;  . INCISION AND DRAINAGE PERIRECTAL ABSCESS  2009  &  2010    Past Surgical History:  Procedure Laterality Date  . COLONOSCOPY  DEC 2011   ILEO-COLONIC ULCERS  . COLONOSCOPY N/A 03/16/2015   SLF: 1/ The examined terminal ileum appeared to be normal 2. The left colon is redundatnt 3. Rectal bleeding due to small internal hemorrhoids  . CYSTOSCOPY WITH RETROGRADE PYELOGRAM, URETEROSCOPY AND STENT PLACEMENT Left 02/27/2014   Procedure: CYSTOSCOPY WITH RETROGRADE PYELOGRAM, URETEROSCOPY/ STENT PLACEMENT;  Surgeon: Alexis Frock, MD;  Location: [facility];  Service: Urology;  Laterality: Left;  . ESOPHAGOGASTRODUODENOSCOPY N/A 03/16/2015   SLF: 1. epigastric pain most likely due to gastritis/pyschosocial stressors, less likely GERD. 2. Mild non-erosive gastritis.   Marland Kitchen FLEXIBLE SIGMOIDOSCOPY N/A 12/11/2013   Procedure: FLEXIBLE SIGMOIDOSCOPY;  Surgeon: Danie Binder, MD;  [facility];  Service: Endoscopy;  Laterality: N/A;  1:45 PM  . GIVENS CAPSULE STUDY  DEC 2011   SB ULCERS  .  HOLMIUM LASER APPLICATION Left 05/05/320   Procedure: HOLMIUM LASER APPLICATION;  Surgeon: Alexis Frock, MD;  Location: [facility];  Service: Urology;  Laterality: Left;  . INCISION AND DRAINAGE PERIRECTAL ABSCESS  2009  &  2010    Allergies  Allergen Reactions  . Morphine Hives   Current Outpatient Prescriptions  Medication Sig Dispense Refill  . amitriptyline (ELAVIL) 10 MG  tablet 1 PO QHS. (Patient taking differently: 10 mg as needed. )    . azathioprine (IMURAN) 50 MG tablet Take 100 mg total) by mouth daily.    .      . inFLIXimab (REMICADE) 100 MG injection Inject 300 mg into the vein every 8 (eight) weeks. LAST INJECTION @NOV  8     . medroxyPROGESTERone (DEPO-SUBQ PROVERA) 104 MG/0.65ML injection Inject 104 mg into the skin every 3 (three) months. LAST INJECTION JAN 2     . MIRALAX PRN    .      Marland Kitchen       Review of Systems PER HPI OTHERWISE ALL SYSTEMS ARE NEGATIVE.    Objective:   Physical Exam  Constitutional: She is oriented to person, place, and time. She appears well-developed and well-nourished. No distress.  HENT:  Head: Normocephalic and atraumatic.  Mouth/Throat: Oropharynx is clear and moist. No oropharyngeal exudate.  Eyes: Pupils are equal, round, and reactive to light. No scleral icterus.  Neck: Normal range of motion. Neck supple.  Cardiovascular: Normal rate, regular rhythm and normal heart sounds.   Pulmonary/Chest: Effort normal and breath sounds normal. No respiratory distress.  Abdominal: Soft. Bowel sounds are normal. She exhibits no distension. There is tenderness. There is no rebound and no guarding.  MILD PERI-UMBILICAL TTP  Genitourinary: Rectal exam shows external hemorrhoid. Rectal exam shows no fissure, no mass and no tenderness.     Genitourinary Comments: NO FISTULA, PT STATES PAIN IN AREAS OF GRAY AFTER A BM  Musculoskeletal: She exhibits no edema.  Lymphadenopathy:    She has no cervical adenopathy.  Neurological: She is alert and oriented to person, place, and time.  NO FOCAL DEFICITS  Psychiatric: She has a normal mood and affect.  Vitals reviewed.     Assessment & Plan:

## 2016-12-01 NOTE — Telephone Encounter (Signed)
PLEASE CALL PT. Rx sent FOR nitroglycerin ointment and amitriptyline. SHE MAY PICK UP RX FOR PREVNAR. IT WILL BE AT THE FRONT DESK. I CALLED AND SPOKE TO EMILY BATES AT DR. [facility]ONQU[facility]S [facility]. SHE SHOULD CALL THEM TO [facility] HER APPT. I FAXED ORDER TO [facility] FOR HER REMICADE AS WELL.

## 2016-12-01 NOTE — Telephone Encounter (Signed)
Tried to call and could not leave VM.

## 2016-12-01 NOTE — Telephone Encounter (Signed)
Pt called and said [facility] [place], they can give the pneumonia shot but will need RX since she is under age 26. She said they do not give another in 8 weeks now, they wait a year.  Also, she said she needs Rx for the amitriptyline sent to pharmacy.  Needs the nitro ointment.  She will be going back to [place] on Monday.

## 2016-12-01 NOTE — Telephone Encounter (Signed)
Pt is aware and she wants me to fax the Prevnar [facility] to [facility] [place] when it is ready. [name] will let me know.

## 2016-12-01 NOTE — Assessment & Plan Note (Signed)
CROHN'S DISEASE AND DEPO SHOT.  DEXA AND VIT D LEVEL.

## 2016-12-02 LAB — VITAMIN D 25 HYDROXY (VIT D DEFICIENCY, FRACTURES): VIT D 25 HYDROXY: 35 ng/mL (ref 30–100)

## 2016-12-02 LAB — HEPATITIS B SURFACE ANTIBODY,QUALITATIVE: HEP B S AB: NEGATIVE

## 2016-12-02 LAB — HEPATITIS A ANTIBODY, TOTAL: HEP A TOTAL AB: REACTIVE — AB

## 2016-12-02 NOTE — Telephone Encounter (Signed)
PT is aware I have faxed the Rx for the [facility]vnar to [facility].

## 2016-12-06 DIAGNOSIS — Z23 Encounter for immunization: Secondary | ICD-10-CM | POA: Diagnosis not present

## 2016-12-06 NOTE — Telephone Encounter (Signed)
REVIEWED-NO ADDITIONAL RECOMMENDATIONS. 

## 2016-12-07 ENCOUNTER — Telehealth: Payer: Self-pay

## 2016-12-07 ENCOUNTER — Ambulatory Visit (HOSPITAL_COMMUNITY)
Admission: RE | Admit: 2016-12-07 | Discharge: 2016-12-07 | Disposition: A | Discharge status: Home / Self Care | Payer: Medicare Other | Source: Ambulatory Visit | Attending: Gastroenterology | Admitting: Gastroenterology

## 2016-12-07 DIAGNOSIS — M818 Other osteoporosis without current pathological fracture: Secondary | ICD-10-CM | POA: Insufficient documentation

## 2016-12-07 DIAGNOSIS — K529 Noninfective gastroenteritis and colitis, unspecified: Secondary | ICD-10-CM | POA: Diagnosis not present

## 2016-12-07 DIAGNOSIS — M810 Age-related osteoporosis without current pathological fracture: Secondary | ICD-10-CM | POA: Diagnosis not present

## 2016-12-07 NOTE — Telephone Encounter (Signed)
Pt is aware.  

## 2016-12-07 NOTE — Telephone Encounter (Signed)
Pt called and her Nitroglycerine ointment is going to cost over [facility].00 [facility] [place]. Please advise!

## 2016-12-07 NOTE — Telephone Encounter (Signed)
PLEASE CALL PT. SHE S AWARE RECTIV IS NOT COVERED AND THERE IS NO ALTERNATIVE. SHE WILL PAY OUT OF POCKET.

## 2016-12-07 NOTE — Telephone Encounter (Signed)
What do they cover?

## 2016-12-07 NOTE — Telephone Encounter (Signed)
I called the pharmacist, Coreen, she said it just says not covered, non formulary. Does not give any recommendations, she said there is nothing similar to the [place].  [name], please advise on what pt can have?

## 2016-12-09 ENCOUNTER — Telehealth: Payer: Self-pay

## 2016-12-09 ENCOUNTER — Telehealth: Payer: Self-pay | Admitting: Gastroenterology

## 2016-12-09 DIAGNOSIS — A09 Infectious gastroenteritis and colitis, unspecified: Secondary | ICD-10-CM

## 2016-12-09 NOTE — Telephone Encounter (Signed)
PLEASE CALL PT. SHE IS IMMUNE TO HEPATITIS A. SHE NEEDS THE HEP B SERIES. HER VITAMIN D AND B12 ARE NORMAL. HER DEXA SCAN SHOWS OSTEOPOROSIS. SHE NEEDS CALCIUM WITH VITAMIN D TID AFTER MEALS. SHE NEEDS TO SEE DR NIDA, ENDOCRINOLOGY, Dx: PREMATURE OSTEOPOROSIS.

## 2016-12-09 NOTE — Telephone Encounter (Signed)
Pt called and said she had a lot of blood in stool once yesterday, she had a regular BM but her stomach hurt very bad before it came out.  She has diarrhea this morning and it has been going on since 7:00 am, it was more green.  Please advise!

## 2016-12-09 NOTE — Telephone Encounter (Signed)
Tried to call and could not leave a message.

## 2016-12-09 NOTE — Telephone Encounter (Signed)
PLEASE CALL PT. SHE SHOULD SUBMIT STOOL FOR GI PANEL ASAP. SOUNDS LIKE SHE HAS AN ACUTE GASTROENTERITIS OR CROHN'S FLARE. SHE WILL NEED TO SUBMIT THE STOOL SAMPLE BEFORE I WOULD CONSIDER ADDING A PULSE DOSE OF STEROIDS.  DRINK WATER TO KEEP YOUR URINE LIGHT YELLOW. DRINK DILUTE APPLE JUICE(2 PARTS WATER: 1 PART APPLE JUICE) AVOID DAIRY USE KAOPECTATE IF NEEDED FOR DIARRHEA

## 2016-12-09 NOTE — Telephone Encounter (Signed)
Tried to call pt. VM not set up. Orders have been faxed to [facility] ([facility]) and pt will need to go by there for the container.

## 2016-12-09 NOTE — Addendum Note (Signed)
Addended by: Danie Binder on: 12/09/2016 09:58 AM   Modules accepted: Orders

## 2016-12-12 ENCOUNTER — Other Ambulatory Visit: Payer: Self-pay

## 2016-12-12 DIAGNOSIS — M810 Age-related osteoporosis without current pathological fracture: Secondary | ICD-10-CM

## 2016-12-12 NOTE — Telephone Encounter (Signed)
Routing to [facility] Clinical Staff to make the referral.

## 2016-12-12 NOTE — Telephone Encounter (Signed)
REVIEWED-NO ADDITIONAL RECOMMENDATIONS. 

## 2016-12-12 NOTE — Telephone Encounter (Signed)
Pt is aware. She is much better, no diarrhea.  She will stop by and pick up container and order so if she needs it again.

## 2016-12-12 NOTE — Telephone Encounter (Signed)
Pt is aware and will come by for the print out of info.

## 2016-12-12 NOTE — Telephone Encounter (Signed)
Referral has been made.

## 2016-12-19 ENCOUNTER — Other Ambulatory Visit: Payer: Self-pay

## 2016-12-19 DIAGNOSIS — M810 Age-related osteoporosis without current pathological fracture: Secondary | ICD-10-CM

## 2016-12-26 ENCOUNTER — Other Ambulatory Visit: Payer: Self-pay

## 2016-12-26 ENCOUNTER — Telehealth: Payer: Self-pay

## 2016-12-26 DIAGNOSIS — R1030 Lower abdominal pain, unspecified: Secondary | ICD-10-CM

## 2016-12-26 NOTE — Telephone Encounter (Addendum)
Pt called and said she has been having abdominal pain x 3 days and worse in lower abdomen. She said the pain is worse when she wakes up in the middle of the night to urinate and after she urinates. Although, she said she has no pain with urination. She is not having any rectal bleeding, having a normal BM about every other day. She is aware that [name] is off today and I will send to extender to advise! She also said her bones are hurting but that she has an appt with a bone doctor tomorrow.  Sending to Neil Crouch, PA to advise!

## 2016-12-26 NOTE — Telephone Encounter (Signed)
PT left Vm that she will be by later today and would like to pick up copy of her Bone Density report to take with her to the doctor tomorrow. I have left the copy at front for her.

## 2016-12-26 NOTE — Telephone Encounter (Signed)
Send her for u/a with culture reflux. Save for slf input upon return.

## 2016-12-26 NOTE — Telephone Encounter (Signed)
PT is aware to go to [facility] today.

## 2016-12-27 ENCOUNTER — Ambulatory Visit (INDEPENDENT_AMBULATORY_CARE_PROVIDER_SITE_OTHER): Payer: Medicare Other | Admitting: Neurology

## 2016-12-27 ENCOUNTER — Encounter: Payer: Self-pay | Admitting: Neurology

## 2016-12-27 VITALS — BP 122/69 | HR 87 | Ht 64.0 in | Wt 145.0 lb

## 2016-12-27 DIAGNOSIS — R202 Paresthesia of skin: Secondary | ICD-10-CM

## 2016-12-27 DIAGNOSIS — E538 Deficiency of other specified B group vitamins: Secondary | ICD-10-CM | POA: Diagnosis not present

## 2016-12-27 LAB — URINALYSIS, ROUTINE W REFLEX MICROSCOPIC
Bilirubin Urine: NEGATIVE
Glucose, UA: NEGATIVE
HGB URINE DIPSTICK: NEGATIVE
Ketones, ur: NEGATIVE
NITRITE: NEGATIVE
PH: 6 (ref 5.0–8.0)
Protein, ur: NEGATIVE
Specific Gravity, Urine: 1.021 (ref 1.001–1.035)

## 2016-12-27 LAB — URINALYSIS, MICROSCOPIC ONLY
CASTS: NONE SEEN [LPF]
Crystals: NONE SEEN [HPF]
RBC / HPF: NONE SEEN RBC/HPF (ref <=2)
Yeast: NONE SEEN [HPF]

## 2016-12-27 MED ORDER — GABAPENTIN 100 MG PO CAPS: 100.0000 mg | ORAL_CAPSULE | Freq: Three times a day (TID) | ORAL | 2 refills | Status: DC

## 2016-12-27 MED ORDER — CYANOCOBALAMIN 1000 MCG/ML IJ SOLN
1000.0000 ug | Freq: Once | INTRAMUSCULAR | Status: AC
Start: 2016-12-27 — End: 2016-12-27
  Administered 2016-12-27: 1000 ug via INTRAMUSCULAR

## 2016-12-27 NOTE — Progress Notes (Signed)
Pt is aware. Rx called to [facility] at [facility] in [place]. I called [facility] and spoke to [place] who said they could not do culture and sensitivities.

## 2016-12-27 NOTE — Progress Notes (Signed)
Reason for visit: Numbness and discomfort of extremities  Referring physician: Dr. Thedora Franklin is a 26 y.o. female  History of present illness:  Kendra Franklin is a 26 year old right-handed black female with a history of Crohn's disease. The patient has been diagnosed with Crohn's disease 7 years ago. Around that time, she had discomfort and numbness down both arms and down both legs from the knees to the feet and from the shoulders to the hands. Her symptoms resolved with use of steroids, and they have not returned until about 3 months ago. The patient has had recurrence of her previous symptoms. She has an achy sensation in the hands and feet, some numbness, she claims that she is weak with some balance issues, but she has not had any falls. She has not had any change in bowel or bladder control, but she recently has been found to have a bladder infection. She also reports occasional headaches that have been every other day over the last 2 weeks. The patient has bifrontal headaches and she will take Tylenol for this. She denies any vision changes or changes in speech or swallowing. She is not having blackout episodes. She may occasionally have a sharp shooting pain down the left leg. Recent blood work shows a low normal vitamin B12 level of 206. She is sent to this office for further evaluation.  Past Medical History:  Diagnosis Date  . Crohn's    DX   DEC 2011--- ASCA 100 pANCA NEG-APR 2012 6-TGN 233(LLN 230)  6-MMPN 1173 ON 75 MG IMURAN  . Crohn's disease, small and large intestine ([place])   . History of anal fissures   . History of anal lesion    2009   NON--HEALING  . History of kidney stones   . History of small intestine ulcer    2011    Past Surgical History:  Procedure Laterality Date  . COLONOSCOPY  DEC 2011   ILEO-COLONIC ULCERS  . COLONOSCOPY N/A 03/16/2015   SLF: 1/ The examined terminal ileum appeared to be normal 2. The left colon is redundatnt 3. Rectal  bleeding due to small internal hemorrhoids  . CYSTOSCOPY WITH RETROGRADE PYELOGRAM, URETEROSCOPY AND STENT PLACEMENT Left 02/27/2014   Procedure: CYSTOSCOPY WITH RETROGRADE PYELOGRAM, URETEROSCOPY/ STENT PLACEMENT;  Surgeon: Alexis Frock, MD;  Location: [facility];  Service: Urology;  Laterality: Left;  . ESOPHAGOGASTRODUODENOSCOPY N/A 03/16/2015   SLF: 1. epigastric pain most likely due to gastritis/pyschosocial stressors, less likely GERD. 2. Mild non-erosive gastritis.   Marland Kitchen FLEXIBLE SIGMOIDOSCOPY N/A 12/11/2013   Procedure: FLEXIBLE SIGMOIDOSCOPY;  Surgeon: Danie Binder, MD;  [facility];  Service: Endoscopy;  Laterality: N/A;  1:45 PM  . GIVENS CAPSULE STUDY  DEC 2011   SB ULCERS  . HOLMIUM LASER APPLICATION Left 07/31/5700   Procedure: HOLMIUM LASER APPLICATION;  Surgeon: Alexis Frock, MD;  Location: [facility];  Service: Urology;  Laterality: Left;  . INCISION AND DRAINAGE PERIRECTAL ABSCESS  2009  &  2010    Family History  Problem Relation Age of Onset  . Diabetes Maternal Grandmother   . Colon cancer Neg Hx   . Colon polyps Neg Hx   . Inflammatory bowel disease Neg Hx     Social history:  reports that she has never smoked. She has never used smokeless tobacco. She reports that she drinks alcohol. She reports that she does not use drugs.  Medications:  Prior to Admission medications  Medication Sig Start Date End Date Taking? Authorizing Provider  amitriptyline (ELAVIL) 10 MG tablet 1-2 PO QHS. 12/01/16  Yes Danie Binder, MD  azaTHIOprine (IMURAN) 50 MG tablet TAKE 2 TABLET (100 MG TOTAL) BY MOUTH DAILY. 10/04/16  Yes Annitta Needs, NP  inFLIXimab (REMICADE) 100 MG injection Inject 300 mg into the vein every 8 (eight) weeks. LAST INJECTION NOV 2017 07/07/11  Yes Danie Binder, MD  medroxyPROGESTERone (DEPO-SUBQ PROVERA) 104 MG/0.65ML injection Inject 104 mg into the skin every 3 (three) months. LAST INJECTION FEB 2015   Yes Historical  Provider, MD  polyethylene glycol powder (GLYCOLAX/MIRALAX) powder MIX 17 GRAMS (MARKED ON CAP) WITH LIQUID AND DRINK ONCE DAILY AS NEEDED 06/22/16  Yes Annitta Needs, NP  gabapentin (NEURONTIN) 100 MG capsule Take 1 capsule (100 mg total) by mouth 3 (three) times daily. 12/27/16   Kathrynn Ducking, MD      Allergies  Allergen Reactions  . Morphine Hives    ROS:  Out of a complete 14 system review of symptoms, the patient complains only of the following symptoms, and all other reviewed systems are negative.  Joint pain, achy muscles Headache, weakness Restless legs  Blood pressure 122/69, pulse 87, height 5' 4"  (1.626 m), weight 145 lb (65.8 kg).  Physical Exam  General: The patient is alert and cooperative at the time of the examination.  Eyes: Pupils are equal, round, and reactive to light. Discs are flat bilaterally.  Neck: The neck is supple, no carotid bruits are noted.  Respiratory: The respiratory examination is clear.  Cardiovascular: The cardiovascular examination reveals a regular rate and rhythm, no obvious murmurs or rubs are noted.  Neuromuscular: The patient has full range of movement of the cervical spine and lumbar spine without discomfort.  Skin: Extremities are without significant edema.  Neurologic Exam  Mental status: The patient is alert and oriented x 3 at the time of the examination. The patient has apparent normal recent and remote memory, with an apparently normal attention span and concentration ability.  Cranial nerves: Facial symmetry is present. There is good sensation of the face to pinprick and soft touch bilaterally. The strength of the facial muscles and the muscles to head turning and shoulder shrug are normal bilaterally. Speech is well enunciated, no aphasia or dysarthria is noted. Extraocular movements are full. Visual fields are full. The tongue is midline, and the patient has symmetric elevation of the soft palate. No obvious hearing  deficits are noted.  Motor: The motor testing reveals 5 over 5 strength of all 4 extremities. Good symmetric motor tone is noted throughout.  Sensory: Sensory testing is intact to pinprick, soft touch, vibration sensation, and position sense on all 4 extremities, with exception of face sensory deficit to pinprick in a stocking pattern two thirds the way up the legs bilaterally. No evidence of extinction is noted.  Coordination: Cerebellar testing reveals good finger-nose-finger and heel-to-shin bilaterally.  Gait and station: Gait is normal. Tandem gait is normal. Romberg is negative. No drift is seen.  Reflexes: Deep tendon reflexes are symmetric and normal bilaterally. Toes are downgoing bilaterally.   Assessment/Plan:  1. Bilateral arm and leg discomfort, numbness  The patient has subjective complaints of sensory alteration and discomfort. The clinical examination is completely normal. There is no evidence of weakness or balance changes or reflex changes. The patient does have a low normal vitamin B12 level, and a history of Crohn's disease. We will give a B12 injection today, I  will have the patient go on vitamin B12 supplementation taking 1000 g daily. Blood work will be done today. She will be set up for nerve conduction studies of one arm and one leg, EMG of one arm and one leg. If the studies are unremarkable, I may consider MRI of the brain and cervical spine to exclude demyelinating disease.  Jill Alexanders MD 12/27/2016 3:46 PM  [facility] [address] [place], [place] [address]  Phone [phone] Fax [phone]

## 2016-12-27 NOTE — Progress Notes (Signed)
OK. Will treat with cipro. She should call back if ongoing symptoms. If ongoing symptoms, then we will need urine culture with sensitivities.

## 2016-12-27 NOTE — Patient Instructions (Addendum)
   Begin vitamin b12 1000 mcg a day.  We will get blood work today and get EMG and NCV study to evaluate the nerves.

## 2016-12-27 NOTE — Telephone Encounter (Signed)
REVIEWED-NO ADDITIONAL RECOMMENDATIONS. 

## 2016-12-27 NOTE — Telephone Encounter (Signed)
REVIEWED. AGREE. NO ADDITIONAL RECOMMENDATIONS. 

## 2016-12-27 NOTE — Progress Notes (Signed)
Looks like UTI, please call in Cipro 558m po bid for 3 days.   Can we see if they can do culture and sensitivities?

## 2016-12-28 NOTE — Progress Notes (Signed)
Pt is aware to call if she has on going problems.

## 2016-12-29 ENCOUNTER — Telehealth: Payer: Self-pay | Admitting: Neurology

## 2016-12-29 LAB — SEDIMENTATION RATE: SED RATE: 23 mm/h (ref 0–32)

## 2016-12-29 LAB — ENA+DNA/DS+SJORGEN'S
ENA RNP Ab: 2.2 AI — ABNORMAL HIGH (ref 0.0–0.9)
ENA SM AB SER-ACNC: 0.2 AI (ref 0.0–0.9)
ENA SSB (LA) Ab: <0.2 AI (ref 0.0–0.9)
dsDNA Ab: 1 IU/mL (ref 0–9)

## 2016-12-29 LAB — METHYLMALONIC ACID, SERUM: Methylmalonic Acid: 188 nmol/L (ref 0–378)

## 2016-12-29 LAB — B. BURGDORFI ANTIBODIES

## 2016-12-29 LAB — ANGIOTENSIN CONVERTING ENZYME: Angio Convert Enzyme: 37 U/L (ref 14–82)

## 2016-12-29 LAB — ANA W/REFLEX: Anti Nuclear Antibody(ANA): POSITIVE — AB

## 2016-12-29 LAB — RHEUMATOID FACTOR: Rhuematoid fact SerPl-aCnc: <10 IU/mL (ref 0.0–13.9)

## 2016-12-29 NOTE — Telephone Encounter (Signed)
I called patient. The blood work was unremarkable with exception of a positive ANA with only the RNP antibody being mildly elevated. I'm not clear that this is clinically significant. We will follow this along. The patient will return for EMG and nerve conduction study evaluation. I discussed this with patient.

## 2017-01-02 ENCOUNTER — Telehealth: Payer: Self-pay

## 2017-01-02 DIAGNOSIS — R1030 Lower abdominal pain, unspecified: Secondary | ICD-10-CM

## 2017-01-02 NOTE — Telephone Encounter (Signed)
Kendra Franklin, pt would like to know if you can send in more antibiotics since she is still having pain in her lower abdomen.

## 2017-01-03 ENCOUNTER — Telehealth: Payer: Self-pay | Admitting: Gastroenterology

## 2017-01-03 DIAGNOSIS — R8290 Unspecified abnormal findings in urine: Secondary | ICD-10-CM | POA: Diagnosis not present

## 2017-01-03 DIAGNOSIS — R350 Frequency of micturition: Secondary | ICD-10-CM | POA: Diagnosis not present

## 2017-01-03 DIAGNOSIS — K50913 Crohn's disease, unspecified, with fistula: Secondary | ICD-10-CM | POA: Diagnosis not present

## 2017-01-03 DIAGNOSIS — A6004 Herpesviral vulvovaginitis: Secondary | ICD-10-CM | POA: Diagnosis not present

## 2017-01-03 DIAGNOSIS — Z6824 Body mass index (BMI) 24.0-24.9, adult: Secondary | ICD-10-CM | POA: Diagnosis not present

## 2017-01-03 DIAGNOSIS — Z113 Encounter for screening for infections with a predominantly sexual mode of transmission: Secondary | ICD-10-CM | POA: Diagnosis not present

## 2017-01-03 DIAGNOSIS — R102 Pelvic and perineal pain: Secondary | ICD-10-CM | POA: Diagnosis not present

## 2017-01-03 NOTE — Telephone Encounter (Signed)
See phone note dated 01/02/2017.

## 2017-01-03 NOTE — Telephone Encounter (Signed)
PT called back this morning and said she does not feel she has a UTI. So I spoke to Neil Crouch, PA and she said it is OK to address this with [name] since she is back.   PT said she has been having stomach pains for about a week like she does with the Crohn's flare.  Said she has missed class for the last 3 weeks.  She is having a lot of nausea every morning for the last week. She said the pills [name] gave her to take at night help her rest, but she needs something to help with her pain in the day. She wants to know if she can have something to help in the day. She also wants to know if [name] can check her out and see if everything is OK. She is aware that [name] is at the hospital and I will send this message to her.

## 2017-01-03 NOTE — Telephone Encounter (Signed)
Closing encounter

## 2017-01-03 NOTE — Telephone Encounter (Signed)
Pt called asking for DS. DS said to tell the patient that she was aware of her question and would call her back when LSL gets back to her. Patient said that wasn't what she was calling about. I told her that I would transfer her call to DS VM and she agreed

## 2017-01-04 ENCOUNTER — Encounter (HOSPITAL_COMMUNITY): Payer: Self-pay

## 2017-01-04 ENCOUNTER — Encounter (HOSPITAL_COMMUNITY)
Admission: RE | Admit: 2017-01-04 | Discharge: 2017-01-04 | Disposition: A | Discharge status: Home / Self Care | Payer: Medicare Other | Source: Ambulatory Visit | Attending: Gastroenterology | Admitting: Gastroenterology

## 2017-01-04 DIAGNOSIS — K50913 Crohn's disease, unspecified, with fistula: Secondary | ICD-10-CM | POA: Diagnosis not present

## 2017-01-04 MED ORDER — SODIUM CHLORIDE 0.9 % IV SOLN
INTRAVENOUS | Status: DC
  Administered 2017-01-04: 11:00:00 via INTRAVENOUS

## 2017-01-04 MED ORDER — SODIUM CHLORIDE 0.9 % IV SOLN
300.0000 mg | Freq: Once | INTRAVENOUS | Status: AC
  Administered 2017-01-04: 300 mg via INTRAVENOUS
  Filled 2017-01-04: qty 30

## 2017-01-07 NOTE — Telephone Encounter (Signed)
Called patient TO DISCUSS CONCERNS. MISSED SCHOOL FOR 2 WEEKS AND 1 WEEK. HER STOMACH KEEPS HURTING. RX FOR UTI AND ON ABX FOR 3 DAYS THEN NEXT DAY STOMACH STARTED HURTING TOWARD BELLY BUTTON. WENT TO [facility]. URINATING EVERY 30 MINS. BUTT HURTS WHEN SHE PASSES STOOL.  CAN'T AFFORD NTG OINTMENT(~$500). LAST REMICADE WED AND NOT ANY BETTER. PAIN TODAY IS GOOD SINCE LAST NIGHT.FEEL LIKE SHE EMPTIES HER BLADDER AND 5 MINS LATER SHE HAS TO URINATE AGAIN. NO DYSURIA OR HEMATURIA. BOWELS ARE NORMAL. SML AMOUNT OF DRAINAGE FROM ANUS AFTER BM.  PT NEED CT ABD/PELVIS W/ IV RECTAL, AND ORAL CONTRAST, DX: CHRON'S DISEASE, ABDOMINAL PAIN, FREQUENT URINATION. PT REQUEST STUDY ON WED FEB 14 AFTER 12 N.

## 2017-01-09 ENCOUNTER — Other Ambulatory Visit: Payer: Self-pay

## 2017-01-09 NOTE — Telephone Encounter (Signed)
Kendra Franklin did not have anything on 01/11/17 for a CT scan. She is set up for CT scan on 01/17/17 @ 4:00 pm. She will need to be NPO 4 hours before and she will need to pick contrast before hand.

## 2017-01-09 NOTE — Telephone Encounter (Signed)
She is aware of appointment and time

## 2017-01-09 NOTE — Telephone Encounter (Signed)
Noted  

## 2017-01-09 NOTE — Telephone Encounter (Signed)
Called Dr. Liliane Channel office (endocrinology) to f/u on referral. Pt doesn't have an appt yet. Receptionist will talk to someone who handles referrals and let us know.

## 2017-01-17 ENCOUNTER — Ambulatory Visit (HOSPITAL_COMMUNITY)
Admission: RE | Admit: 2017-01-17 | Discharge: 2017-01-17 | Disposition: A | Discharge status: Home / Self Care | Payer: Medicare Other | Source: Ambulatory Visit | Attending: Gastroenterology | Admitting: Gastroenterology

## 2017-01-17 ENCOUNTER — Encounter (HOSPITAL_COMMUNITY): Payer: Self-pay

## 2017-01-17 DIAGNOSIS — N281 Cyst of kidney, acquired: Secondary | ICD-10-CM | POA: Diagnosis not present

## 2017-01-17 DIAGNOSIS — K5090 Crohn's disease, unspecified, without complications: Secondary | ICD-10-CM | POA: Diagnosis not present

## 2017-01-17 DIAGNOSIS — K7689 Other specified diseases of liver: Secondary | ICD-10-CM | POA: Diagnosis not present

## 2017-01-17 DIAGNOSIS — N200 Calculus of kidney: Secondary | ICD-10-CM | POA: Diagnosis not present

## 2017-01-17 DIAGNOSIS — Z8719 Personal history of other diseases of the digestive system: Secondary | ICD-10-CM | POA: Diagnosis not present

## 2017-01-17 DIAGNOSIS — R1030 Lower abdominal pain, unspecified: Secondary | ICD-10-CM | POA: Insufficient documentation

## 2017-01-17 MED ORDER — IOPAMIDOL (ISOVUE-300) INJECTION 61%
INTRAVENOUS | Status: AC
  Filled 2017-01-17: qty 30

## 2017-01-17 MED ORDER — LIDOCAINE HCL 2 % EX GEL
1.0000 "application " | Freq: Once | CUTANEOUS | Status: DC
  Filled 2017-01-17: qty 5

## 2017-01-17 MED ORDER — IOPAMIDOL (ISOVUE-300) INJECTION 61%
100.0000 mL | Freq: Once | INTRAVENOUS | Status: AC | PRN
  Administered 2017-01-17: 100 mL via INTRAVENOUS

## 2017-01-21 ENCOUNTER — Telehealth: Payer: Self-pay | Admitting: Gastroenterology

## 2017-01-21 NOTE — Telephone Encounter (Signed)
PLEASE CALL PT. HER CT SHOWS HER CROHN'S IS UNDER CONTROL. SHE DOES NOT HAVE EVIDENCE FOR A FISTULA. CONTINUE REMICADE AND IMURAN.

## 2017-01-23 NOTE — Telephone Encounter (Signed)
Pt is aware.  

## 2017-02-07 ENCOUNTER — Ambulatory Visit: Payer: Medicare Other | Admitting: "Endocrinology

## 2017-02-08 ENCOUNTER — Encounter: Payer: Self-pay | Admitting: Neurology

## 2017-02-08 ENCOUNTER — Ambulatory Visit (INDEPENDENT_AMBULATORY_CARE_PROVIDER_SITE_OTHER): Payer: Medicare Other | Admitting: Neurology

## 2017-02-08 ENCOUNTER — Ambulatory Visit (INDEPENDENT_AMBULATORY_CARE_PROVIDER_SITE_OTHER): Payer: Self-pay | Admitting: Neurology

## 2017-02-08 ENCOUNTER — Encounter (INDEPENDENT_AMBULATORY_CARE_PROVIDER_SITE_OTHER): Payer: Self-pay

## 2017-02-08 DIAGNOSIS — R202 Paresthesia of skin: Secondary | ICD-10-CM | POA: Diagnosis not present

## 2017-02-08 NOTE — Progress Notes (Signed)
The patient comes in today for EMG and nerve conduction study evaluation. She claims that since going on the vitamin B12 supplementation she feels completely normal, no sensory paresthesias are noted at all.  The patient has had normal nerve conduction studies of the left arm and left leg, she refused full EMG evaluation.  At this point, the patient will contact our office if the symptoms return, we will consider MRI evaluation of the brain and cervical spine if this is the case. She will need some follow-up in the future to ensure the vitamin B12 levels are maintained with oral supplementation only.

## 2017-02-08 NOTE — Progress Notes (Signed)
Please refer to EMG and nerve conduction study procedure note. 

## 2017-02-08 NOTE — Procedures (Signed)
     HISTORY:  Kendra Franklin is a 26 year old patient with a history of Crohn's disease who reports paresthesias all 4 extremities. The patient was found to have a low normal vitamin B12 level, she was given an injection of B12 and she has gone on oral supplementation with resolution of her sensory symptoms. The patient comes in today indicating that she feels normal, she is being evaluated for a possible peripheral neuropathy.  NERVE CONDUCTION STUDIES:  Nerve conduction studies were performed on the left upper extremity. The distal motor latencies and motor amplitudes for the median and ulnar nerves were within normal limits. The F wave latencies and nerve conduction velocities for these nerves were also normal. The sensory latencies for the median and ulnar nerves were normal.  Nerve conduction studies were performed on the left lower extremity. The distal motor latencies and motor amplitudes for the peroneal and posterior tibial nerves were within normal limits. The nerve conduction velocities for these nerves were also normal. The H reflex latency was normal. The sensory latencies for the sural nerve was within normal limits.   EMG STUDIES:  A limited EMG study was performed on the left lower extremity:  The tibialis anterior muscle reveals 2 to 4K motor units with full recruitment. No fibrillations or positive waves were seen. The peroneus tertius muscle reveals 2 to 4K motor units with full recruitment. No fibrillations or positive waves were seen. The medial gastrocnemius muscle reveals 1 to 3K motor units with full recruitment. No fibrillations or positive waves were seen. The vastus lateralis muscle reveals 2 to 4K motor units with full recruitment. No fibrillations or positive waves were seen.  The patient refused further EMG testing.   IMPRESSION:  Nerve conduction studies done on the left upper and left lower extremities were within normal limits. No evidence of a  peripheral neuropathy is seen. A limited EMG of the left lower extremity was performed and was normal. The patient refused full EMG testing of the left lower extremity and of the left upper extremity.  Jill Alexanders MD 02/08/2017 9:20 AM  [facility] [address] [place], [place] [address]  Phone [phone] Fax [phone]

## 2017-02-09 ENCOUNTER — Ambulatory Visit (INDEPENDENT_AMBULATORY_CARE_PROVIDER_SITE_OTHER): Payer: Medicare Other | Admitting: "Endocrinology

## 2017-02-09 ENCOUNTER — Encounter: Payer: Self-pay | Admitting: "Endocrinology

## 2017-02-09 VITALS — BP 113/79 | HR 102 | Ht 64.0 in | Wt 144.0 lb

## 2017-02-09 DIAGNOSIS — T380X5A Adverse effect of glucocorticoids and synthetic analogues, initial encounter: Secondary | ICD-10-CM

## 2017-02-09 DIAGNOSIS — M818 Other osteoporosis without current pathological fracture: Secondary | ICD-10-CM | POA: Diagnosis not present

## 2017-02-09 MED ORDER — ALENDRONATE SODIUM 70 MG PO TABS: 70.0000 mg | ORAL_TABLET | ORAL | 4 refills | Status: DC

## 2017-02-09 MED ORDER — VITAMIN D3 125 MCG (5000 UT) PO CAPS: 5000.0000 [IU] | ORAL_CAPSULE | Freq: Every day | ORAL | 0 refills | Status: DC

## 2017-02-09 NOTE — Progress Notes (Signed)
Subjective:    Patient ID: Kendra Franklin, female    DOB: 03-Oct-1991, PCP Pcp Not In System   Past Medical History:  Diagnosis Date  . Crohn's    DX   DEC 2011--- ASCA 100 pANCA NEG-APR 2012 6-TGN 233(LLN 230)  6-MMPN 1173 ON 75 MG IMURAN  . Crohn's disease, small and large intestine ([place])   . History of anal fissures   . History of anal lesion    2009   NON--HEALING  . History of kidney stones   . History of small intestine ulcer    2011   Past Surgical History:  Procedure Laterality Date  . COLONOSCOPY  DEC 2011   ILEO-COLONIC ULCERS  . COLONOSCOPY N/A 03/16/2015   SLF: 1/ The examined terminal ileum appeared to be normal 2. The left colon is redundatnt 3. Rectal bleeding due to small internal hemorrhoids  . CYSTOSCOPY WITH RETROGRADE PYELOGRAM, URETEROSCOPY AND STENT PLACEMENT Left 02/27/2014   Procedure: CYSTOSCOPY WITH RETROGRADE PYELOGRAM, URETEROSCOPY/ STENT PLACEMENT;  Surgeon: Alexis Frock, MD;  Location: [facility];  Service: Urology;  Laterality: Left;  . ESOPHAGOGASTRODUODENOSCOPY N/A 03/16/2015   SLF: 1. epigastric pain most likely due to gastritis/pyschosocial stressors, less likely GERD. 2. Mild non-erosive gastritis.   Marland Kitchen FLEXIBLE SIGMOIDOSCOPY N/A 12/11/2013   Procedure: FLEXIBLE SIGMOIDOSCOPY;  Surgeon: Danie Binder, MD;  [facility];  Service: Endoscopy;  Laterality: N/A;  1:45 PM  . GIVENS CAPSULE STUDY  DEC 2011   SB ULCERS  . HOLMIUM LASER APPLICATION Left 0/01/91   Procedure: HOLMIUM LASER APPLICATION;  Surgeon: Alexis Frock, MD;  Location: [facility];  Service: Urology;  Laterality: Left;  . INCISION AND DRAINAGE PERIRECTAL ABSCESS  2009  &  2010   Social History   Social History  . Marital status: Single    Spouse name: N/A  . Number of children: 0  . Years of education: BSW   Occupational History  . Student    Social History Main Topics  . Smoking status: Never Smoker  . Smokeless tobacco:  Never Used  . Alcohol use Yes     Comment: RARE  . Drug use: No  . Sexual activity: Not Asked   Other Topics Concern  . None   Social History Narrative   Graduating this spring. Straight A student. Going to [employer]. Mother: Arlyss Repress. No siblings. Mother smokes.   Lives at home w/ her mother   Right-handed   Caffeine: denies   Outpatient Encounter Prescriptions as of 02/09/2017  Medication Sig  . azaTHIOprine (IMURAN) 50 MG tablet TAKE 2 TABLET (100 MG TOTAL) BY MOUTH DAILY.  . inFLIXimab (REMICADE) 100 MG injection Inject 300 mg into the vein every 8 (eight) weeks. LAST INJECTION NOV 2017  . medroxyPROGESTERone (DEPO-SUBQ PROVERA) 104 MG/0.65ML injection Inject 104 mg into the skin every 3 (three) months. LAST INJECTION FEB 2015  . alendronate (FOSAMAX) 70 MG tablet Take 1 tablet (70 mg total) by mouth every 7 (seven) days. Take with a full glass of water on an empty stomach.  . Cholecalciferol (VITAMIN D3) 5000 units CAPS Take 1 capsule (5,000 Units total) by mouth daily.  . [DISCONTINUED] amitriptyline (ELAVIL) 10 MG tablet 1-2 PO QHS.  . [DISCONTINUED] gabapentin (NEURONTIN) 100 MG capsule Take 1 capsule (100 mg total) by mouth 3 (three) times daily.  . [DISCONTINUED] polyethylene glycol powder (GLYCOLAX/MIRALAX) powder MIX 17 GRAMS (MARKED ON CAP) WITH LIQUID AND DRINK ONCE DAILY AS  NEEDED   No facility-administered encounter medications on file as of 02/09/2017.    ALLERGIES: Allergies  Allergen Reactions  . Morphine Hives    VACCINATION STATUS:  There is no immunization history on file for this patient.  HPI  26 year old female patient with medical history as above. She is being seen in consultation for osteoporosis requested by [name]. - She is known to have Crohn's disease currently on therapy azathioprine and Remicade. She follows regularly in gastroenterology with [name]. - She has required some steroid therapy in the past but appears to be very  minimal exposure. - She underwent bone density study on 12/07/2016 which showed Z score of -2.9 on Dual Femur and -0.4 on spine. - She denies personal history of fractures. She denies family history of Crohn's disease nor osteoporosis. - She does not involve in contact sports. - She is on Depo-Provera for contraceptive purposes- hence no regular menstrual cycles. -She is taking multivitamins. She denies any history of thyroid/parathyroid dysfunction. 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  Review of Systems  Constitutional: no weight gain/loss, + fatigue, no subjective hyperthermia, no subjective hypothermia Eyes: no blurry vision, no xerophthalmia ENT: no sore throat, no nodules palpated in throat, no dysphagia/odynophagia, no hoarseness Cardiovascular: no Chest Pain, no Shortness of Breath, no palpitations, no leg swelling Respiratory: no cough, no SOB Gastrointestinal: no Nausea/Vomiting/Diarhhea Musculoskeletal: no muscle/joint aches Skin: no rashes Neurological: no tremors, no numbness, no tingling, no dizziness Psychiatric: no depression, no anxiety  Objective:    BP 113/79   Pulse (!) 102   Ht 5' 4"  (1.626 m)   Wt 144 lb (65.3 kg)   BMI 24.72 kg/m   Wt Readings from Last 3 Encounters:  02/09/17 144 lb (65.3 kg)  01/04/17 145 lb (65.8 kg)  12/27/16 145 lb (65.8 kg)    Physical Exam  Constitutional: Appropriate weight for hight, not in acute distress, normal state of mind Eyes: PERRLA, EOMI, no exophthalmos ENT: moist mucous membranes, no thyromegaly, no cervical lymphadenopathy Cardiovascular: normal precordial activity, Regular Rate and Rhythm, no Murmur/Rubs/Gallops Respiratory:  adequate breathing efforts, no gross chest deformity, Clear to auscultation bilaterally Gastrointestinal: abdomen soft, Non -tender, No distension, Bowel Sounds present Musculoskeletal: no gross deformities, strength intact in all four extremities Skin: moist,  warm, no rashes Neurological: no tremor with outstretched hands, Deep tendon reflexes normal in all four extremities.  CMP ( most recent) CMP     Component Value Date/Time   NA 137 12/01/2016 0959   K 4.2 12/01/2016 0959   CL 105 12/01/2016 0959   CO2 19 (L) 12/01/2016 0959   GLUCOSE 70 12/01/2016 0959   BUN 11 12/01/2016 0959   CREATININE 0.68 12/01/2016 0959   CALCIUM 9.6 12/01/2016 0959   PROT 7.7 12/01/2016 0959   ALBUMIN 4.2 12/01/2016 0959   AST 19 12/01/2016 0959   ALT 11 12/01/2016 0959   ALKPHOS 46 12/01/2016 0959   BILITOT 1.1 12/01/2016 0959   GFRNONAA >89 12/01/2016 0959   GFRAA >89 12/01/2016 0959   Bone densitometry from 12/07/2016 is reviewed.  Assessment & Plan:   1. Osteoporosis - I have reviewed her records and clinically evaluated this patient. - She has multiple risk factors for osteoporosis including Crohn's disease. - He does give history significant for heavy steroid exposure. - She will need some baseline workup to rule out secondary cause of osteoporosis including hypoparathyroidism/hyperthyroidism.  - As of osteoporosis in premenopausal woman is not  diagnosed based on bone densitometry alone, Given the significantly decreased bone mass on bilateral hips with Z score of -2.9 and active Crohn's disease , she is at risk for fractures. - It is not clear if she never achieved optimal bone mass since this is her first bone density study in January 2018. -Considering all the circumstances, she is a candidate to benefit from bisphosphonate therapy with adequate calcium and vitamin D introduced early. - I discussed with her pathology and therapeutic options of osteoporosis. She agrees with my plan of alendronate 70 mg by mouth weekly. Side effects and precautions discussed with her. - She will also be initiated on a regular vitamin D 3 5000 units daily. - She will have her repeat bone density in January 2020 to assess effect of therapy. - She is  advised to  avoid strenuous exercise including contact sports however would benefit from weight bearing exercises including resistance scored, swimming, walking, and aerobics. - This measure is important to decrease her risk of fractures.  - She'll return in 4 weeks to review her labs.  - I advised patient to maintain close follow up with Pcp Not In System for primary care needs. Follow up plan: Return in about 4 weeks (around 03/09/2017) for follow up with pre-visit labs.  Glade Lloyd, MD Phone: [phone]  Fax: [phone]   02/09/2017, 3:28 PM

## 2017-02-27 ENCOUNTER — Telehealth: Payer: Self-pay

## 2017-02-27 NOTE — Telephone Encounter (Signed)
No she does not have to stop OCP for blood test.

## 2017-02-27 NOTE — Telephone Encounter (Signed)
Pt.notified

## 2017-02-27 NOTE — Telephone Encounter (Signed)
Pt states she was "supposed to stop her birth control prior to a test? "

## 2017-02-28 DIAGNOSIS — T380X5A Adverse effect of glucocorticoids and synthetic analogues, initial encounter: Secondary | ICD-10-CM | POA: Diagnosis not present

## 2017-02-28 DIAGNOSIS — M818 Other osteoporosis without current pathological fracture: Secondary | ICD-10-CM | POA: Diagnosis not present

## 2017-03-01 ENCOUNTER — Encounter (HOSPITAL_COMMUNITY)
Admission: RE | Admit: 2017-03-01 | Discharge: 2017-03-01 | Disposition: A | Discharge status: Home / Self Care | Payer: Medicare Other | Source: Ambulatory Visit | Attending: Gastroenterology | Admitting: Gastroenterology

## 2017-03-01 ENCOUNTER — Encounter (HOSPITAL_COMMUNITY): Payer: Self-pay

## 2017-03-01 DIAGNOSIS — K50913 Crohn's disease, unspecified, with fistula: Secondary | ICD-10-CM | POA: Diagnosis not present

## 2017-03-01 DIAGNOSIS — Z113 Encounter for screening for infections with a predominantly sexual mode of transmission: Secondary | ICD-10-CM | POA: Diagnosis not present

## 2017-03-01 DIAGNOSIS — N898 Other specified noninflammatory disorders of vagina: Secondary | ICD-10-CM | POA: Diagnosis not present

## 2017-03-01 DIAGNOSIS — Z3042 Encounter for surveillance of injectable contraceptive: Secondary | ICD-10-CM | POA: Diagnosis not present

## 2017-03-01 DIAGNOSIS — Z6824 Body mass index (BMI) 24.0-24.9, adult: Secondary | ICD-10-CM | POA: Diagnosis not present

## 2017-03-01 DIAGNOSIS — L298 Other pruritus: Secondary | ICD-10-CM | POA: Diagnosis not present

## 2017-03-01 DIAGNOSIS — R300 Dysuria: Secondary | ICD-10-CM | POA: Diagnosis not present

## 2017-03-01 LAB — TSH: TSH: 0.802 u[IU]/mL (ref 0.450–4.500)

## 2017-03-01 LAB — PTH, INTACT AND CALCIUM
CALCIUM: 9.7 mg/dL (ref 8.7–10.2)
PTH: 16 pg/mL (ref 15–65)

## 2017-03-01 LAB — T4, FREE: Free T4: 1.37 ng/dL (ref 0.82–1.77)

## 2017-03-01 MED ORDER — SODIUM CHLORIDE 0.9 % IV SOLN
300.0000 mg | INTRAVENOUS | Status: DC
  Administered 2017-03-01: 300 mg via INTRAVENOUS
  Filled 2017-03-01: qty 30

## 2017-03-01 MED ORDER — SODIUM CHLORIDE 0.9 % IV SOLN
Freq: Once | INTRAVENOUS | Status: AC
  Administered 2017-03-01: 11:00:00 via INTRAVENOUS

## 2017-03-08 ENCOUNTER — Encounter: Payer: Self-pay | Admitting: "Endocrinology

## 2017-03-08 ENCOUNTER — Ambulatory Visit (INDEPENDENT_AMBULATORY_CARE_PROVIDER_SITE_OTHER): Payer: Medicare Other | Admitting: "Endocrinology

## 2017-03-08 VITALS — BP 119/77 | HR 105 | Ht 64.0 in | Wt 141.0 lb

## 2017-03-08 DIAGNOSIS — M818 Other osteoporosis without current pathological fracture: Secondary | ICD-10-CM | POA: Diagnosis not present

## 2017-03-08 DIAGNOSIS — T380X5A Adverse effect of glucocorticoids and synthetic analogues, initial encounter: Secondary | ICD-10-CM

## 2017-03-08 NOTE — Progress Notes (Signed)
Subjective:    Patient ID: Kendra Franklin, female    DOB: 08-Aug-1991, PCP No PCP Per Patient   Past Medical History:  Diagnosis Date  . Crohn's    DX   DEC 2011--- ASCA 100 pANCA NEG-APR 2012 6-TGN 233(LLN 230)  6-MMPN 1173 ON 75 MG IMURAN  . Crohn's disease, small and large intestine ([place])   . History of anal fissures   . History of anal lesion    2009   NON--HEALING  . History of kidney stones   . History of small intestine ulcer    2011   Past Surgical History:  Procedure Laterality Date  . COLONOSCOPY  DEC 2011   ILEO-COLONIC ULCERS  . COLONOSCOPY N/A 03/16/2015   SLF: 1/ The examined terminal ileum appeared to be normal 2. The left colon is redundatnt 3. Rectal bleeding due to small internal hemorrhoids  . CYSTOSCOPY WITH RETROGRADE PYELOGRAM, URETEROSCOPY AND STENT PLACEMENT Left 02/27/2014   Procedure: CYSTOSCOPY WITH RETROGRADE PYELOGRAM, URETEROSCOPY/ STENT PLACEMENT;  Surgeon: Alexis Frock, MD;  Location: [facility];  Service: Urology;  Laterality: Left;  . ESOPHAGOGASTRODUODENOSCOPY N/A 03/16/2015   SLF: 1. epigastric pain most likely due to gastritis/pyschosocial stressors, less likely GERD. 2. Mild non-erosive gastritis.   Marland Kitchen FLEXIBLE SIGMOIDOSCOPY N/A 12/11/2013   Procedure: FLEXIBLE SIGMOIDOSCOPY;  Surgeon: Danie Binder, MD;  [facility];  Service: Endoscopy;  Laterality: N/A;  1:45 PM  . GIVENS CAPSULE STUDY  DEC 2011   SB ULCERS  . HOLMIUM LASER APPLICATION Left 03/05/2499   Procedure: HOLMIUM LASER APPLICATION;  Surgeon: Alexis Frock, MD;  Location: [facility];  Service: Urology;  Laterality: Left;  . INCISION AND DRAINAGE PERIRECTAL ABSCESS  2009  &  2010   Social History   Social History  . Marital status: Single    Spouse name: N/A  . Number of children: 0  . Years of education: BSW   Occupational History  . Student    Social History Main Topics  . Smoking status: Never Smoker  . Smokeless tobacco:  Never Used  . Alcohol use Yes     Comment: RARE  . Drug use: No  . Sexual activity: Not Asked   Other Topics Concern  . None   Social History Narrative   Graduating this spring. Straight A student. Going to [employer]. Mother: Arlyss Repress. No siblings. Mother smokes.   Lives at home w/ her mother   Right-handed   Caffeine: denies   Outpatient Encounter Prescriptions as of 03/08/2017  Medication Sig  . alendronate (FOSAMAX) 70 MG tablet Take 1 tablet (70 mg total) by mouth every 7 (seven) days. Take with a full glass of water on an empty stomach.  Marland Kitchen azaTHIOprine (IMURAN) 50 MG tablet TAKE 2 TABLET (100 MG TOTAL) BY MOUTH DAILY.  Marland Kitchen Cholecalciferol (VITAMIN D3) 5000 units CAPS Take 1 capsule (5,000 Units total) by mouth daily.  Marland Kitchen inFLIXimab (REMICADE) 100 MG injection Inject 300 mg into the vein every 8 (eight) weeks. LAST INJECTION NOV 2017  . medroxyPROGESTERone (DEPO-SUBQ PROVERA) 104 MG/0.65ML injection Inject 104 mg into the skin every 3 (three) months. LAST INJECTION FEB 2015   No facility-administered encounter medications on file as of 03/08/2017.    ALLERGIES: Allergies  Allergen Reactions  . Morphine Hives    VACCINATION STATUS:  There is no immunization history on file for this patient.  HPI  26 year old female patient with medical history as above. She is  being seen in f/u for osteoporosis . - She is known to have Crohn's disease currently on therapy azathioprine and Remicade. She follows regularly in gastroenterology with [name]. - She has required some steroid therapy in the past but appears to be very minimal exposure. - She underwent bone density study on 12/07/2016 which showed Z score of -2.9 on Dual Femur and -0.4 on spine. - She denies personal history of fractures. She denies family history of Crohn's disease nor osteoporosis. - She does not involve in contact sports. - She is on Depo-Provera for contraceptive purposes- hence no regular menstrual  cycles. -She is taking multivitamins. She denies any history of thyroid/parathyroid dysfunction. 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  Review of Systems  Constitutional: no weight gain/loss, + fatigue, no subjective hyperthermia, no subjective hypothermia Eyes: no blurry vision, no xerophthalmia ENT: no sore throat, no nodules palpated in throat, no dysphagia/odynophagia, no hoarseness Cardiovascular: no Chest Pain, no Shortness of Breath, no palpitations, no leg swelling Respiratory: no cough, no SOB Gastrointestinal: no Nausea/Vomiting/Diarhhea Musculoskeletal: no muscle/joint aches Skin: no rashes Neurological: no tremors, no numbness, no tingling, no dizziness Psychiatric: no depression, no anxiety  Objective:    BP 119/77   Pulse (!) 105   Ht 5' 4"  (1.626 m)   Wt 141 lb (64 kg)   BMI 24.20 kg/m   Wt Readings from Last 3 Encounters:  03/08/17 141 lb (64 kg)  03/01/17 144 lb (65.3 kg)  02/09/17 144 lb (65.3 kg)    Physical Exam  Constitutional: Appropriate weight for hight, not in acute distress, normal state of mind Eyes: PERRLA, EOMI, no exophthalmos ENT: moist mucous membranes, no thyromegaly, no cervical lymphadenopathy Cardiovascular: normal precordial activity, Regular Rate and Rhythm, no Murmur/Rubs/Gallops Respiratory:  adequate breathing efforts, no gross chest deformity, Clear to auscultation bilaterally Gastrointestinal: abdomen soft, Non -tender, No distension, Bowel Sounds present Musculoskeletal: no gross deformities, strength intact in all four extremities Skin: moist, warm, no rashes Neurological: no tremor with outstretched hands, Deep tendon reflexes normal in all four extremities.  CMP ( most recent) CMP     Component Value Date/Time   NA 137 12/01/2016 0959   K 4.2 12/01/2016 0959   CL 105 12/01/2016 0959   CO2 19 (L) 12/01/2016 0959   GLUCOSE 70 12/01/2016 0959   BUN 11 12/01/2016 0959   CREATININE 0.68  12/01/2016 0959   CALCIUM 9.7 02/28/2017 1651   PROT 7.7 12/01/2016 0959   ALBUMIN 4.2 12/01/2016 0959   AST 19 12/01/2016 0959   ALT 11 12/01/2016 0959   ALKPHOS 46 12/01/2016 0959   BILITOT 1.1 12/01/2016 0959   GFRNONAA >89 12/01/2016 0959   GFRAA >89 12/01/2016 0959    Recent Results (from the past 2160 hour(s))  Urinalysis, Routine w reflex microscopic     Status: Abnormal   Collection Time: 12/26/16 12:40 PM  Result Value Ref Range   Color, Urine YELLOW YELLOW   APPearance CLEAR CLEAR   Specific Gravity, Urine 1.021 1.001 - 1.035   pH 6.0 5.0 - 8.0   Glucose, UA NEGATIVE NEGATIVE   Bilirubin Urine NEGATIVE NEGATIVE   Ketones, ur NEGATIVE NEGATIVE   Hgb urine dipstick NEGATIVE NEGATIVE   Protein, ur NEGATIVE NEGATIVE   Nitrite NEGATIVE NEGATIVE   Leukocytes, UA 1+ (A) NEGATIVE  Urine Microscopic     Status: Abnormal   Collection Time: 12/26/16 12:40 PM  Result Value Ref Range   WBC, UA 10-20 (  A) <=5 WBC/HPF   RBC / HPF NONE SEEN <=2 RBC/HPF   Squamous Epithelial / LPF 0-5 <=5 HPF   Bacteria, UA MANY (A) NONE SEEN HPF   Crystals NONE SEEN NONE SEEN HPF   Casts NONE SEEN NONE SEEN LPF   Yeast NONE SEEN NONE SEEN HPF   Urine-Other SEE NOTE     Comment: Moderate mucous threads  Methylmalonic acid, serum     Status: None   Collection Time: 12/27/16  4:04 PM  Result Value Ref Range   Methylmalonic Acid 188 0 - 378 nmol/L  Sedimentation rate     Status: None   Collection Time: 12/27/16  4:04 PM  Result Value Ref Range   Sed Rate 23 0 - 32 mm/hr  B. burgdorfi antibodies     Status: None   Collection Time: 12/27/16  4:04 PM  Result Value Ref Range   Lyme IgG/IgM Ab <0.91 0.00 - 0.90 ISR    Comment:                                 Negative         <0.91                                 Equivocal  0.91 - 1.09                                 Positive         >1.09   Rheumatoid factor     Status: None   Collection Time: 12/27/16  4:04 PM  Result Value Ref Range    Rhuematoid fact SerPl-aCnc <10.0 0.0 - 13.9 IU/mL  Angiotensin converting enzyme     Status: None   Collection Time: 12/27/16  4:04 PM  Result Value Ref Range   Angio Convert Enzyme 37 14 - 82 U/L  ANA w/Reflex     Status: Abnormal   Collection Time: 12/27/16  4:04 PM  Result Value Ref Range   Anit Nuclear Antibody(ANA) Positive (A) Negative  ENA+DNA/DS+Sjorgen's     Status: Abnormal   Collection Time: 12/27/16  4:04 PM  Result Value Ref Range   dsDNA Ab 1 0 - 9 IU/mL    Comment:                                    Negative      <5                                    Equivocal  5 - 9                                    Positive      >9    ENA RNP Ab 2.2 (H) 0.0 - 0.9 AI   ENA SM Ab Ser-aCnc 0.2 0.0 - 0.9 AI   ENA SSA (RO) Ab <0.2 0.0 - 0.9 AI   ENA SSB (LA) Ab <0.2 0.0 - 0.9 AI   See below: Comment     Comment: Autoantibody  Disease Association ------------------------------------------------------------                         Condition                  Frequency ---------------------   ------------------------   --------- Antinuclear Antibody,    SLE, mixed connective Direct (ANA-D)           tissue diseases ---------------------   ------------------------   --------- dsDNA                    SLE                        40 - 60% ---------------------   ------------------------   --------- Chromatin                Drug induced SLE                90%                          SLE                        48 - 97% ---------------------   ------------------------   --------- SSA (Ro)                 SLE                        25 - 35%                          Sjogren's Syndrome         40 - 70%                          Neonatal Lupus                 100% ---------------------   ------------------------   --------- SSB (La)                 SLE                              10%                          Sjogren's Syndrome              30% ---------------------    -----------------------    --------- Sm (anti-Smith)          SLE                        15 - 30% ---------------------   -----------------------    --------- RNP                      Mixed Connective Tissue                          Disease                         95% (U1 nRNP,  SLE                        30 - 50% anti-ribonucleoprotein)  Polymyositis and/or                          Dermatomyositis                 20% ---------------------   ------------------------   --------- Scl-70 (antiDNA          Scleroderma (diffuse)      20 - 35% topoisomerase)           Crest                           13% ---------------------   ------------------------   --------- Jo-1                     Polymyositis and/or                          Dermatomyositis            20 - 40% ---------------------   ------------------------   --------- Centromere B             Scleroderma -  Crest                          variant                         80%   PTH, intact and calcium     Status: None   Collection Time: 02/28/17  4:51 PM  Result Value Ref Range   Calcium 9.7 8.7 - 10.2 mg/dL   PTH 16 15 - 65 pg/mL   PTH Comment     Comment: Interpretation                 Intact PTH    Calcium                                 (pg/mL)      (mg/dL) Normal                          15 - 65     8.6 - 10.2 Primary Hyperparathyroidism         >65          >10.2 Secondary Hyperparathyroidism       >65          <10.2 Non-Parathyroid Hypercalcemia       <65          >10.2 Hypoparathyroidism                  <15          < 8.6 Non-Parathyroid Hypocalcemia    15 - 65          < 8.6   TSH     Status: None   Collection Time: 02/28/17  4:51 PM  Result Value Ref Range   TSH 0.802 0.450 - 4.500 uIU/mL  T4, free     Status: None   Collection Time: 02/28/17  4:51 PM  Result Value Ref Range   Free T4 1.37  0.82 - 1.77 ng/dL    Bone densitometry from 12/07/2016 is reviewed.  Assessment & Plan:   1.  Osteoporosis - I have reviewed her records and clinically evaluated this patient. - She has multiple risk factors for osteoporosis including Crohn's disease. - She does not give history significant for heavy steroid exposure. -  Her workup for common secondary cause of osteoporosis are negative including hyperparathyroidism/hyperthyroidism .  - As of osteoporosis in premenopausal woman is not diagnosed based on bone densitometry alone, Given the significantly decreased bone mass on bilateral hips with Z score of -2.9 and active Crohn's disease , she is at risk for fractures. - It is not clear if she never achieved optimal bone mass since this is her first bone density study in January 2018. -Considering all the circumstances, she is a candidate to benefit from bisphosphonate therapy with adequate calcium and vitamin D introduced early. - I discussed with her pathology and therapeutic options of osteoporosis. She agrees with my plan of alendronate 70 mg by mouth weekly. Side effects and precautions discussed with her. - She will also be continued on a regular vitamin D 3 5000 units daily. - She will have her repeat bone density in January 2020 to assess effect of therapy. - She is  advised to avoid strenuous exercise including contact sports however would benefit from weight bearing exercises including resistance cord, swimming, walking, and aerobics. - This measure is important to decrease her risk of fractures.  - I advised patient to maintain close follow up with her PCP primary care needs. Follow up plan: Return in about 6 months (around 09/07/2017) for follow up with pre-visit labs.  Glade Lloyd, MD Phone: [phone]  Fax: [phone]   03/08/2017, 11:50 AM

## 2017-03-15 NOTE — Progress Notes (Signed)
REVIEWED-NO ADDITIONAL RECOMMENDATIONS. 

## 2017-03-17 DIAGNOSIS — N898 Other specified noninflammatory disorders of vagina: Secondary | ICD-10-CM | POA: Diagnosis not present

## 2017-03-17 DIAGNOSIS — Z6824 Body mass index (BMI) 24.0-24.9, adult: Secondary | ICD-10-CM | POA: Diagnosis not present

## 2017-04-27 ENCOUNTER — Telehealth: Payer: Self-pay | Admitting: Gastroenterology

## 2017-04-27 NOTE — Telephone Encounter (Signed)
PAPERWORK COMPLETE FOR [facility]

## 2017-05-03 ENCOUNTER — Encounter (HOSPITAL_COMMUNITY)
Admission: RE | Admit: 2017-05-03 | Discharge: 2017-05-03 | Disposition: A | Discharge status: Home / Self Care | Payer: Medicare Other | Source: Ambulatory Visit | Attending: Gastroenterology | Admitting: Gastroenterology

## 2017-05-03 ENCOUNTER — Telehealth: Payer: Self-pay

## 2017-05-03 DIAGNOSIS — K50913 Crohn's disease, unspecified, with fistula: Secondary | ICD-10-CM | POA: Diagnosis not present

## 2017-05-03 MED ORDER — HYDROCODONE-ACETAMINOPHEN 5-325 MG PO TABS
1.0000 | ORAL_TABLET | Freq: Once | ORAL | Status: AC
  Administered 2017-05-03: 1 via ORAL

## 2017-05-03 MED ORDER — SODIUM CHLORIDE 0.9 % IV SOLN
INTRAVENOUS | Status: DC
  Administered 2017-05-03: 250 mL via INTRAVENOUS

## 2017-05-03 MED ORDER — SODIUM CHLORIDE 0.9 % IV SOLN
300.0000 mg | Freq: Once | INTRAVENOUS | Status: AC
  Administered 2017-05-03: 300 mg via INTRAVENOUS
  Filled 2017-05-03: qty 30

## 2017-05-03 MED ORDER — HYDROCODONE-ACETAMINOPHEN 5-325 MG PO TABS
ORAL_TABLET | ORAL | Status: AC
  Filled 2017-05-03: qty 1

## 2017-05-03 NOTE — Progress Notes (Signed)
Continues c/o abd cramping. norco 23m/325mg po given as ordered.

## 2017-05-03 NOTE — Progress Notes (Signed)
Doris from Dr [name] office called with order for pain med. Norco 68m/325mg 1 tablet po x1 now.

## 2017-05-03 NOTE — Telephone Encounter (Signed)
T/C from [name] @ [name] [phone]) , pt is there for Remicade. She is complaining of abdominal cramps and rates her pain at a 7. She refused the Tylenol and Claritin. Said she needs something for pain. [name] said OK to give pt Hydrocodone 5/325 mg one time.  Kendra Franklin is aware.

## 2017-05-03 NOTE — Progress Notes (Signed)
Sleeping at intervals. States pain med somewhat effective. Returns to sleep.

## 2017-05-03 NOTE — Progress Notes (Signed)
Did not take claritin and tylenol at home before coming to hospital. Refuses to take tylenol and claritin now. Wants something for her abd pain and cramping. Rates pain 6-7. Informed would contact Dr [name] for further orders. Voiced understanding.

## 2017-05-03 NOTE — Telephone Encounter (Signed)
REVIEWED. AGREE. NO ADDITIONAL RECOMMENDATIONS. 

## 2017-05-03 NOTE — Progress Notes (Signed)
Doris at Dr [name] office notified of pt request for pain med. Will notify Dr [name].

## 2017-05-03 NOTE — Progress Notes (Signed)
remicade completed. Denies pain. Voices no c/o at this time. No hives or skin rash present.

## 2017-05-24 DIAGNOSIS — Z3042 Encounter for surveillance of injectable contraceptive: Secondary | ICD-10-CM | POA: Diagnosis not present

## 2017-05-29 ENCOUNTER — Telehealth: Payer: Self-pay | Admitting: Gastroenterology

## 2017-05-29 NOTE — Telephone Encounter (Signed)
We had cancellation tomorrow, 05/30/2017 and pt has appt at 9:30 Am with Neil Crouch PA. Her mom is aware for her to be here at 9:15 Am.

## 2017-05-29 NOTE — Telephone Encounter (Signed)
Zamara's mother called wanting to make OV with SF ASAP. I told her the next available with SF would be Sept 5th. Mother said that was too long to wait and why can't SF work her in to be seen. I told her SF was on vacation and what I had in September was all that was available. Mother wants to speak with nurse. Please call [phone]

## 2017-05-29 NOTE — Telephone Encounter (Signed)
I called and spoke to pt's mom, Halicia. She said pt has been sick since Thursday, a lot of nausea with some abd cramps. Said she cannot eat, but she is keeping fluids down, plenty of water and juice and ginger ale. I told her if she gets to where she cannot keep fluids down to go to the ED. We do not have any appts for this week, but mom said she would see extender if we have cancellations. I asked to speak to the pt and she said she is asleep and will be asleep most of the day.  Said when she feels sick she takes some medication and then sleeps most of the day.  She is aware [name] is out of the office and will be at the hospital tomorrow.

## 2017-05-30 ENCOUNTER — Ambulatory Visit (INDEPENDENT_AMBULATORY_CARE_PROVIDER_SITE_OTHER): Payer: Medicare Other | Admitting: Gastroenterology

## 2017-05-30 ENCOUNTER — Encounter: Payer: Self-pay | Admitting: Gastroenterology

## 2017-05-30 VITALS — BP 131/79 | HR 88 | Temp 97.9°F | Ht 64.0 in | Wt 140.0 lb

## 2017-05-30 DIAGNOSIS — T380X5A Adverse effect of glucocorticoids and synthetic analogues, initial encounter: Secondary | ICD-10-CM | POA: Diagnosis not present

## 2017-05-30 DIAGNOSIS — M818 Other osteoporosis without current pathological fracture: Secondary | ICD-10-CM | POA: Diagnosis not present

## 2017-05-30 DIAGNOSIS — K50913 Crohn's disease, unspecified, with fistula: Secondary | ICD-10-CM | POA: Diagnosis not present

## 2017-05-30 MED ORDER — TRAMADOL HCL 50 MG PO TABS: 50.0000 mg | ORAL_TABLET | Freq: Three times a day (TID) | ORAL | 0 refills | Status: DC | PRN

## 2017-05-30 MED ORDER — PROMETHAZINE HCL 25 MG PO TABS: ORAL_TABLET | ORAL | 0 refills | Status: DC

## 2017-05-30 NOTE — Patient Instructions (Addendum)
1. Please follow up with Dr. Dorris Fetch regarding your prescription for Fosamax.  2. I have sent RX for phenergan and written RX for tramadol (for pain).  3. Try Tylenol initially for pain but if no improvement then you can use tramadol sparingly.  4. [name] wants you to call next week if you are not better. Will hold off on steroids right now.  5. Drink plenty of fluids.

## 2017-05-30 NOTE — Progress Notes (Signed)
Primary Care Physician: Patient, No Pcp Per  Primary Gastroenterologist:  Barney Drain, MD   Chief Complaint  Patient presents with  . Nausea    x5 days  . Emesis  . Abdominal Pain    "all over"  . Diarrhea  . Headache  . Rectal Bleeding    blood in stool, went to [place] (hadn't had bm in 4 days)    HPI: Kendra Franklin is a 26 y.o. female here for urgent office visit. Last seen January 2018. She has a history of ileocolonic Crohn's disease maintained on Imuran and Remicade. Last dose of Remicade 05/03/2017. She reports that she's been doing extremely well over the last 6 months. Last week however she developed acute onset diarrhea, 3 episodes, the night before she was leaving for [place]. She decided to take Imodium, took 2 tablets total within several hours. While in [place] she had a couple episodes of vomiting. She never had another bowel movement until yesterday, [place] 2, small amount of blood in the stool. She's had some headaches. Really no significant abdominal pain but she's had some mild. Umbilical cramping. She's been eating and drinking just fine. Typical stools have been once daily, [place] 4.  Tells me she never started fosamax. "He never sent in the prescription".     Current Outpatient Prescriptions  Medication Sig Dispense Refill  . azaTHIOprine (IMURAN) 50 MG tablet TAKE 2 TABLET (100 MG TOTAL) BY MOUTH DAILY. 60 tablet 3  . inFLIXimab (REMICADE) 100 MG injection Inject 300 mg into the vein every 8 (eight) weeks. LAST INJECTION NOV 2017    . medroxyPROGESTERone (DEPO-SUBQ PROVERA) 104 MG/0.65ML injection Inject 104 mg into the skin every 3 (three) months. LAST INJECTION FEB 2015    . OVER THE COUNTER MEDICATION Vitamin D chewables takes almost daily. (pt thinks dose is 500units)     No current facility-administered medications for this visit.     Allergies as of 05/30/2017 - Review Complete 05/30/2017  Allergen Reaction Noted  . Morphine Hives      ROS:  General: Negative for anorexia, weight loss, fever, chills, fatigue, weakness. ENT: Negative for hoarseness, difficulty swallowing , nasal congestion. CV: Negative for chest pain, angina, palpitations, dyspnea on exertion, peripheral edema.  Respiratory: Negative for dyspnea at rest, dyspnea on exertion, cough, sputum, wheezing.  GI: See history of present illness. GU:  Negative for dysuria, hematuria, urinary incontinence, urinary frequency, nocturnal urination.  Endo: Negative for unusual weight change.    Physical Examination:   BP 131/79   Pulse 88   Temp 97.9 F (36.6 C) (Oral)   Ht 5' 4"  (1.626 m)   Wt 140 lb (63.5 kg)   BMI 24.03 kg/m   General: Well-nourished, well-developed in no acute distress.  Eyes: No icterus. Mouth: Oropharyngeal mucosa moist and pink , no lesions erythema or exudate. Lungs: Clear to auscultation bilaterally.  Heart: Regular rate and rhythm, no murmurs rubs or gallops.  Abdomen: Bowel sounds are normal, mild diffuse tenderness, nondistended, no hepatosplenomegaly or masses, no abdominal bruits or hernia , no rebound or guarding.   Extremities: No lower extremity edema. No clubbing or deformities. Neuro: Alert and oriented x 4   Skin: Warm and dry, no jaundice.   Psych: Alert and cooperative, normal mood and affect.  Labs:  Lab Results  Component Value Date   ALT 11 12/01/2016   AST 19 12/01/2016   ALKPHOS 46 12/01/2016   BILITOT 1.1 12/01/2016   Lab Results  Component Value Date   CREATININE 0.68 12/01/2016   BUN 11 12/01/2016   NA 137 12/01/2016   K 4.2 12/01/2016   CL 105 12/01/2016   CO2 19 (L) 12/01/2016     Imaging Studies: No results found.

## 2017-05-30 NOTE — Telephone Encounter (Signed)
REVIEWED. PT SEEN IN [facility]. DISCUSSED CASE WITH LL.

## 2017-06-01 NOTE — Assessment & Plan Note (Signed)
Encouraged patient to follow-up with endocrinology as she believes she was never provided a prescription for Fosamax.

## 2017-06-01 NOTE — Progress Notes (Signed)
NO PCP PER PATIENT

## 2017-06-01 NOTE — Assessment & Plan Note (Addendum)
Have been doing very well on Remicade and Imuran. No problems in the last 6 months. Acute onset diarrhea with abdominal pain last week. Possibly gastroenteritis versus Crohn's flare. She went 4 days without a BM, passed a hard stool with a small amount of blood in the setting of Imodium use.  Discussed with [name]. Supportive measures. If ongoing abdominal pain patient call next week. Avoid steroids for now. Tylenol for pain, tramadol if needed. Phenergan when necessary If utilizes Imodium in the future, suggest a half tablet initially and consider holding off use unless significant diarrhea present.  [place] controlled substance database reviewed and patient had no controlled substance prescriptions filled in the last six months.

## 2017-06-01 NOTE — Progress Notes (Signed)
Please make f/u appt with SLF ONLY in 2 months.

## 2017-06-01 NOTE — Progress Notes (Signed)
On recall  °

## 2017-06-04 NOTE — Progress Notes (Signed)
REVIEWED-NO ADDITIONAL RECOMMENDATIONS. 

## 2017-06-20 ENCOUNTER — Telehealth: Payer: Self-pay | Admitting: Gastroenterology

## 2017-06-20 NOTE — Telephone Encounter (Signed)
REVIEWED. Place on my chair.

## 2017-06-20 NOTE — Telephone Encounter (Signed)
Noted  

## 2017-06-20 NOTE — Telephone Encounter (Signed)
Pt called this morning asking if SF was in today. I told her she was at the hospital this week and did she need to speak with DS. She said no, that she had forms for SF to fill out for her saying that she is handicap. She is going to bring the papers by the office and I told her that I would let SF know she brought the forms by. Pt agreed.

## 2017-06-26 NOTE — Telephone Encounter (Signed)
Pt is calling to see if the form is ready for her to pick up. I told her the SLF has been at the hospital covering but I would let her know that she called.

## 2017-06-26 NOTE — Telephone Encounter (Signed)
PLEASE CALL PT. SHE CAN PICK UP FORM WED AFTER 12N.

## 2017-06-28 ENCOUNTER — Encounter (HOSPITAL_COMMUNITY): Admission: RE | Admit: 2017-06-28 | Payer: Medicare Other | Source: Ambulatory Visit

## 2017-07-04 ENCOUNTER — Encounter (HOSPITAL_COMMUNITY): Payer: Medicare Other

## 2017-07-04 ENCOUNTER — Encounter (HOSPITAL_COMMUNITY)
Admission: RE | Admit: 2017-07-04 | Discharge: 2017-07-04 | Disposition: A | Discharge status: Home / Self Care | Payer: Medicare Other | Source: Ambulatory Visit | Attending: Gastroenterology | Admitting: Gastroenterology

## 2017-07-04 DIAGNOSIS — K50913 Crohn's disease, unspecified, with fistula: Secondary | ICD-10-CM | POA: Diagnosis not present

## 2017-07-04 MED ORDER — SODIUM CHLORIDE 0.9 % IV SOLN
INTRAVENOUS | Status: DC
  Administered 2017-07-04: 11:00:00 via INTRAVENOUS

## 2017-07-04 MED ORDER — LORATADINE 10 MG PO TABS
ORAL_TABLET | ORAL | Status: AC
  Filled 2017-07-04: qty 1

## 2017-07-04 MED ORDER — ACETAMINOPHEN 325 MG PO TABS
ORAL_TABLET | ORAL | Status: AC
  Filled 2017-07-04: qty 2

## 2017-07-04 MED ORDER — LORATADINE 10 MG PO TABS
10.0000 mg | ORAL_TABLET | Freq: Once | ORAL | Status: AC
  Administered 2017-07-04: 10 mg via ORAL

## 2017-07-04 MED ORDER — ACETAMINOPHEN 325 MG PO TABS
650.0000 mg | ORAL_TABLET | Freq: Once | ORAL | Status: AC
  Administered 2017-07-04: 650 mg via ORAL

## 2017-07-04 MED ORDER — SODIUM CHLORIDE 0.9 % IV SOLN
300.0000 mg | Freq: Once | INTRAVENOUS | Status: AC
  Administered 2017-07-04: 300 mg via INTRAVENOUS
  Filled 2017-07-04: qty 30

## 2017-07-08 ENCOUNTER — Other Ambulatory Visit: Payer: Self-pay | Admitting: Gastroenterology

## 2017-07-10 ENCOUNTER — Encounter: Payer: Self-pay | Admitting: Gastroenterology

## 2017-08-03 DIAGNOSIS — R300 Dysuria: Secondary | ICD-10-CM | POA: Diagnosis not present

## 2017-08-09 DIAGNOSIS — Z308 Encounter for other contraceptive management: Secondary | ICD-10-CM | POA: Diagnosis not present

## 2017-08-24 ENCOUNTER — Other Ambulatory Visit: Payer: Self-pay | Admitting: Nurse Practitioner

## 2017-08-25 ENCOUNTER — Encounter (HOSPITAL_COMMUNITY): Payer: Medicare Other

## 2017-08-28 ENCOUNTER — Telehealth: Payer: Self-pay

## 2017-08-28 ENCOUNTER — Telehealth: Payer: Self-pay | Admitting: Gastroenterology

## 2017-08-28 NOTE — Telephone Encounter (Signed)
NOTED

## 2017-08-28 NOTE — Telephone Encounter (Signed)
Received a fax from [facility] about a PA for this pts Remicade. I have not been asked to do a PA for this pt since she started remicade in 2012. Pt has always had [place] medicaid. Pt now also has Estate agent.  I called Hoyle Sauer to see which insurance is paying for it now and she stated she wasn't sure. But asked me to do a PA for Remicade anyway since the pt is scheduled for 09/08/17 for her next infusion. I have done the paperwork and I have faxed it in to [facility].

## 2017-08-28 NOTE — Telephone Encounter (Signed)
PATIENT CALLED AND STATED THAT THE NEXT PRESCRIPTION SHE NEEDS REFILLED TO PLEASE MAKE SURE IT IS FOR 90 DAYS

## 2017-08-29 ENCOUNTER — Encounter (HOSPITAL_COMMUNITY): Payer: Medicare Other

## 2017-08-31 ENCOUNTER — Encounter (HOSPITAL_COMMUNITY): Payer: Self-pay | Admitting: *Deleted

## 2017-08-31 ENCOUNTER — Emergency Department (HOSPITAL_COMMUNITY)
Admission: EM | Admit: 2017-08-31 | Discharge: 2017-08-31 | Disposition: A | Discharge status: Home / Self Care | Payer: Medicare Other | Attending: Emergency Medicine | Admitting: Emergency Medicine

## 2017-08-31 ENCOUNTER — Telehealth: Payer: Self-pay

## 2017-08-31 DIAGNOSIS — R1084 Generalized abdominal pain: Secondary | ICD-10-CM | POA: Insufficient documentation

## 2017-08-31 NOTE — Telephone Encounter (Signed)
Pt called and said she is having abdominal pain today ( all over) and had diarrhea x 1. Wants to know when she can be seen?  Forwarding to [name] to advise.

## 2017-08-31 NOTE — ED Triage Notes (Signed)
Pt comes in with generalized abdominal pain starting this morning. She has diarrhea as well. She has hx of chron's.

## 2017-09-02 ENCOUNTER — Emergency Department (HOSPITAL_COMMUNITY)
Admission: EM | Admit: 2017-09-02 | Discharge: 2017-09-02 | Disposition: A | Discharge status: Home / Self Care | Payer: Medicare Other | Attending: Emergency Medicine | Admitting: Emergency Medicine

## 2017-09-02 ENCOUNTER — Encounter (HOSPITAL_COMMUNITY): Payer: Self-pay

## 2017-09-02 ENCOUNTER — Emergency Department (HOSPITAL_COMMUNITY): Payer: Medicare Other

## 2017-09-02 DIAGNOSIS — R197 Diarrhea, unspecified: Secondary | ICD-10-CM | POA: Insufficient documentation

## 2017-09-02 DIAGNOSIS — R110 Nausea: Secondary | ICD-10-CM | POA: Diagnosis not present

## 2017-09-02 DIAGNOSIS — D849 Immunodeficiency, unspecified: Secondary | ICD-10-CM | POA: Insufficient documentation

## 2017-09-02 DIAGNOSIS — R1033 Periumbilical pain: Secondary | ICD-10-CM

## 2017-09-02 DIAGNOSIS — N200 Calculus of kidney: Secondary | ICD-10-CM | POA: Diagnosis not present

## 2017-09-02 DIAGNOSIS — Z79899 Other long term (current) drug therapy: Secondary | ICD-10-CM | POA: Insufficient documentation

## 2017-09-02 LAB — CBC WITH DIFFERENTIAL/PLATELET
BASOS PCT: 0 %
Basophils Absolute: 0 10*3/uL (ref 0.0–0.1)
Eosinophils Absolute: 0.1 10*3/uL (ref 0.0–0.7)
Eosinophils Relative: 3 %
HEMATOCRIT: 39.4 % (ref 36.0–46.0)
HEMOGLOBIN: 13.3 g/dL (ref 12.0–15.0)
LYMPHS ABS: 1.2 10*3/uL (ref 0.7–4.0)
Lymphocytes Relative: 31 %
MCH: 33.3 pg (ref 26.0–34.0)
MCHC: 33.8 g/dL (ref 30.0–36.0)
MCV: 98.7 fL (ref 78.0–100.0)
MONOS PCT: 8 %
Monocytes Absolute: 0.3 10*3/uL (ref 0.1–1.0)
NEUTROS ABS: 2.3 10*3/uL (ref 1.7–7.7)
NEUTROS PCT: 58 %
Platelets: 256 10*3/uL (ref 150–400)
RBC: 3.99 MIL/uL (ref 3.87–5.11)
RDW: 13 % (ref 11.5–15.5)
WBC: 4 10*3/uL (ref 4.0–10.5)

## 2017-09-02 LAB — COMPREHENSIVE METABOLIC PANEL
ALBUMIN: 4.1 g/dL (ref 3.5–5.0)
ALK PHOS: 50 U/L (ref 38–126)
ALT: 10 U/L — AB (ref 14–54)
ANION GAP: 7 (ref 5–15)
AST: 17 U/L (ref 15–41)
BUN: 7 mg/dL (ref 6–20)
CALCIUM: 9.3 mg/dL (ref 8.9–10.3)
CO2: 26 mmol/L (ref 22–32)
Chloride: 109 mmol/L (ref 101–111)
Creatinine, Ser: 0.75 mg/dL (ref 0.44–1.00)
GFR calc Af Amer: >60 mL/min (ref >60)
GFR calc non Af Amer: >60 mL/min (ref >60)
GLUCOSE: 95 mg/dL (ref 65–99)
Potassium: 4 mmol/L (ref 3.5–5.1)
Sodium: 142 mmol/L (ref 135–145)
TOTAL PROTEIN: 7.9 g/dL (ref 6.5–8.1)
Total Bilirubin: 0.5 mg/dL (ref 0.3–1.2)

## 2017-09-02 LAB — URINALYSIS, ROUTINE W REFLEX MICROSCOPIC
Bilirubin Urine: NEGATIVE
Glucose, UA: NEGATIVE mg/dL
HGB URINE DIPSTICK: NEGATIVE
KETONES UR: NEGATIVE mg/dL
Nitrite: NEGATIVE
Protein, ur: NEGATIVE mg/dL
Specific Gravity, Urine: 1.02 (ref 1.005–1.030)
pH: 5 (ref 5.0–8.0)

## 2017-09-02 LAB — SEDIMENTATION RATE: Sed Rate: 19 mm/hr (ref 0–22)

## 2017-09-02 LAB — LIPASE, BLOOD: LIPASE: 34 U/L (ref 11–51)

## 2017-09-02 LAB — POC URINE PREG, ED: PREG TEST UR: NEGATIVE

## 2017-09-02 LAB — C-REACTIVE PROTEIN: CRP: <0.8 mg/dL (ref <1.0)

## 2017-09-02 MED ORDER — OMEPRAZOLE 20 MG PO CPDR: 20.0000 mg | DELAYED_RELEASE_CAPSULE | Freq: Every day | ORAL | 0 refills | Status: DC

## 2017-09-02 MED ORDER — IOPAMIDOL (ISOVUE-300) INJECTION 61%
100.0000 mL | Freq: Once | INTRAVENOUS | Status: AC | PRN
  Administered 2017-09-02: 100 mL via INTRAVENOUS

## 2017-09-02 MED ORDER — HYDROMORPHONE HCL 1 MG/ML IJ SOLN
0.5000 mg | Freq: Once | INTRAMUSCULAR | Status: AC
  Administered 2017-09-02: 0.5 mg via INTRAVENOUS
  Filled 2017-09-02: qty 1

## 2017-09-02 MED ORDER — IOPAMIDOL (ISOVUE-300) INJECTION 61%
INTRAVENOUS | Status: AC
  Administered 2017-09-02: 30 mL via ORAL
  Filled 2017-09-02: qty 30

## 2017-09-02 MED ORDER — ONDANSETRON HCL 4 MG/2ML IJ SOLN
4.0000 mg | Freq: Once | INTRAMUSCULAR | Status: AC
  Administered 2017-09-02: 4 mg via INTRAVENOUS
  Filled 2017-09-02: qty 2

## 2017-09-02 MED ORDER — SODIUM CHLORIDE 0.9 % IV BOLUS (SEPSIS)
1000.0000 mL | Freq: Once | INTRAVENOUS | Status: AC
  Administered 2017-09-02: 1000 mL via INTRAVENOUS

## 2017-09-02 NOTE — ED Triage Notes (Signed)
Generalized abdominal pain with nausea and diarrhea x2 days.

## 2017-09-02 NOTE — ED Notes (Signed)
Patient drinking po contrast. Tolerating well.

## 2017-09-02 NOTE — ED Notes (Signed)
Urine pregnancy NEGATIVE.

## 2017-09-02 NOTE — Discharge Instructions (Signed)
Your CT scan is normal. You have kidneys stone, but they have not passed or dropped and is not what is causing your pain.  Please follow-up with [name] regarding any further work-up  Return for worsening symptoms, including fever, intractable vomiting, escalating pain or any other symptoms concerning to you.

## 2017-09-02 NOTE — ED Provider Notes (Signed)
[facility] DEPT Provider Note   CSN: [id number] Arrival date & time: 09/02/17  0745     History   Chief Complaint Chief Complaint  Patient presents with  . Abdominal Pain    HPI Kendra Franklin is a 26 y.o. female.  The history is provided by the patient.  Abdominal Pain   This is a new problem. The current episode started yesterday. The problem occurs constantly. The problem has been gradually worsening. The pain is associated with an unknown factor. The pain is located in the periumbilical region. The pain is at a severity of 6/10. The pain is moderate. Associated symptoms include diarrhea. Pertinent negatives include fever, nausea, vomiting, constipation, dysuria and frequency. The symptoms are aggravated by palpation. Nothing relieves the symptoms. Her past medical history is significant for Crohn's disease.   26 year old female who presents with abdominal pain. She has a history of Crohn's disease on Remicade infusions and Imuran. No previous abdominal surgeries. Reports onset of periumbilical abdominal pain starting yesterday, initially intermittent, but has been constant over the past several hours. Associated with 2 episodes of nonbloody diarrhea. Endorses nausea but denies any vomiting. Denies dysuria, urinary frequency, hematuria, back pain, chest pain or difficulty breathing. Has had a history of kidney stones, which she reports feels different. Has not had significant flares of Crohn's disease in the past, unsure if this may feel similar to previous flares. Past Medical History:  Diagnosis Date  . Crohn's    DX   DEC 2011--- ASCA 100 pANCA NEG-APR 2012 6-TGN 233(LLN 230)  6-MMPN 1173 ON 75 MG IMURAN  . Crohn's disease, small and large intestine ([place])   . History of anal fissures   . History of anal lesion    2009   NON--HEALING  . History of kidney stones   . History of small intestine ulcer    2011    Patient Active Problem List   Diagnosis Date Noted  .  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 Paresthesia 12/27/2016  . Weakness of extremity-UPPER AND LOWER 12/01/2016  . H/O steroid-induced osteopenia 12/01/2016  . AP (abdominal pain)   . Bloody diarrhea 03/11/2015  . Crohn's disease of intestine with fistula ([place]) 12/23/2010  . 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 COLONOSCOPY  DEC 2011   ILEO-COLONIC ULCERS  . COLONOSCOPY N/A 03/16/2015   SLF: 1/ The examined terminal ileum appeared to be normal 2. The left colon is redundatnt 3. Rectal bleeding due to small internal hemorrhoids  . CYSTOSCOPY WITH RETROGRADE PYELOGRAM, URETEROSCOPY AND STENT PLACEMENT Left 02/27/2014   Procedure: CYSTOSCOPY WITH RETROGRADE PYELOGRAM, URETEROSCOPY/ STENT PLACEMENT;  Surgeon: Alexis Frock, MD;  Location: [facility];  Service: Urology;  Laterality: Left;  . ESOPHAGOGASTRODUODENOSCOPY N/A 03/16/2015   SLF: 1. epigastric pain most likely due to gastritis/pyschosocial stressors, less likely GERD. 2. Mild non-erosive gastritis.   Marland Kitchen FLEXIBLE SIGMOIDOSCOPY N/A 12/11/2013   Procedure: FLEXIBLE SIGMOIDOSCOPY;  Surgeon: Danie Binder, MD;  [facility];  Service: Endoscopy;  Laterality: N/A;  1:45 PM  . GIVENS CAPSULE STUDY  DEC 2011   SB ULCERS  . HOLMIUM LASER APPLICATION Left 01/27/9190   Procedure: HOLMIUM LASER APPLICATION;  Surgeon: Alexis Frock, MD;  Location[facility];  Service: Urology;  Laterality: Left;  . INCISION AND DRAINAGE PERIRECTAL ABSCESS  2009  &  2010    OB History    No data available  Home Medications    Prior to Admission medications   Medication Sig Start Date End Date Taking? Authorizing Provider  azaTHIOprine (IMURAN) 50 MG tablet TAKE 2 TABLETS BY MOUTH EVERY DAY 08/27/17  Yes Mahala Menghini, PA-C  inFLIXimab (REMICADE) 100 MG injection Inject 300 mg into the vein every 8 (eight) weeks. LAST INJECTION NOV 2017 07/07/11  Yes  Fields, Sandi L, MD  medroxyPROGESTERone (DEPO-SUBQ PROVERA) 104 MG/0.65ML injection Inject 104 mg into the skin every 3 (three) months. LAST INJECTION FEB 2015   Yes [provider]  OVER THE COUNTER MEDICATION Vitamin D chewables takes almost daily. (pt thinks dose is 500units)   Yes [provider]  polyethylene glycol powder (GLYCOLAX/MIRALAX) powder MIX 17 GRAMS (MARKED ON CAP) WITH LIQUID AND DRINK ONCE DAILY AS NEEDED 07/10/17  Yes Mahala Menghini, PA-C  valACYclovir (VALTREX) 1000 MG tablet Take 1,000 mg by mouth daily. 08/19/17  Yes [provider]  promethazine (PHENERGAN) 25 MG tablet Take 1/2 to 1 tablet every 4-6 hours as needed for nausea Patient not taking: Reported on 09/02/2017 05/30/17   Mahala Menghini, PA-C  traMADol (ULTRAM) 50 MG tablet Take 1 tablet (50 mg total) by mouth every 8 (eight) hours as needed. Patient not taking: Reported on 09/02/2017 05/30/17   Mahala Menghini, PA-C    Family History Family History  Problem Relation Age of Onset  . Diabetes Maternal Grandmother   . Colon cancer Neg Hx   . Colon polyps Neg Hx   . Inflammatory bowel disease Neg Hx     Social History Social History  Substance Use Topics  . Smoking status: Never Smoker  . Smokeless tobacco: Never Used  . Alcohol use Yes     Comment: RARE     Allergies   Morphine   Review of Systems Review of Systems  Constitutional: Negative for fever.  Gastrointestinal: Positive for abdominal pain and diarrhea. Negative for constipation, nausea and vomiting.  Genitourinary: Negative for dysuria and frequency.  All other systems reviewed and are negative.    Physical Exam Updated Vital Signs BP 120/86 (BP Location: Right Arm)   Pulse 95   Temp 97.7 F (36.5 C) (Oral)   Resp 18   Ht 5' 4"  (1.626 m)   Wt 63.5 kg (140 lb)   SpO2 100%   BMI 24.03 kg/m   Physical Exam Physical Exam  Nursing note and vitals reviewed. Constitutional: Well developed, well nourished,  non-toxic, and in no acute distress Head: Normocephalic and atraumatic.  Mouth/Throat: Oropharynx is clear and moist.  Neck: Normal range of motion. Neck supple.  Cardiovascular: Normal rate and regular rhythm.   Pulmonary/Chest: Effort normal and breath sounds normal.  Abdominal: Soft. There is mild generalized tenderness. There is no rebound and no guarding.  Musculoskeletal: Normal range of motion.  Neurological: Alert, no facial droop, fluent speech, moves all extremities symmetrically Skin: Skin is warm and dry.  Psychiatric: Cooperative   ED Treatments / Results  Labs (all labs ordered are listed, but only abnormal results are displayed) Labs Reviewed  COMPREHENSIVE METABOLIC PANEL - Abnormal; Notable for the following:       Result Value   ALT 10 (*)    All other components within normal limits  URINALYSIS, ROUTINE W REFLEX MICROSCOPIC - Abnormal; Notable for the following:    Leukocytes, UA TRACE (*)    Bacteria, UA RARE (*)    Squamous Epithelial / LPF 0-5 (*)    All other components within  normal limits  CBC WITH DIFFERENTIAL/PLATELET  LIPASE, BLOOD  C-REACTIVE PROTEIN  SEDIMENTATION RATE  POC URINE PREG, ED    EKG  EKG Interpretation None       Radiology Ct Abdomen Pelvis W Contrast  Result Date: 09/02/2017 CLINICAL DATA:  Generalized abdominal pain with nausea and diarrhea for 2 days EXAM: CT ABDOMEN AND PELVIS WITH CONTRAST TECHNIQUE: Multidetector CT imaging of the abdomen and pelvis was performed using the standard protocol following bolus administration of intravenous contrast. CONTRAST:  112m ISOVUE-300 IOPAMIDOL (ISOVUE-300) INJECTION 61% COMPARISON:  None. FINDINGS: Lower chest: No acute abnormality. Hepatobiliary: 12 mm hypodense, fluid attenuating right hepatic mass most consistent with a cyst with a smaller adjacent smaller cyst. Liver is otherwise normal. No other focal hepatic mass. No intrahepatic or extrahepatic biliary ductal dilatation. Normal  gallbladder. Pancreas: Unremarkable. No pancreatic ductal dilatation or surrounding inflammatory changes. Spleen: Normal in size without focal abnormality. Adrenals/Urinary Tract: Normal adrenal glands. 15 mm hypodense, fluid attenuating left renal mass most consistent with a cyst. Bilateral nonobstructing renal calculi. Normal bladder. Stomach/Bowel: Stomach is within normal limits. No evidence of bowel wall thickening, distention, or inflammatory changes. No pneumatosis, pneumoperitoneum or portal venous gas. Vascular/Lymphatic: No significant vascular findings are present. No enlarged abdominal or pelvic lymph nodes. Reproductive: Uterus and bilateral adnexa are unremarkable. Other: No abdominal wall hernia or abnormality. No abdominopelvic ascites. Musculoskeletal: No acute or significant osseous findings. IMPRESSION: 1. Bilateral nonobstructing renal calculi. 2. No acute abdominal or pelvic pathology. Electronically Signed   By: HKathreen Devoid  On: 09/02/2017 12:58    Procedures Procedures (including critical care time)  Medications Ordered in ED Medications  sodium chloride 0.9 % bolus 1,000 mL (0 mLs Intravenous Stopped 09/02/17 0932)  ondansetron (ZOFRAN) injection 4 mg (4 mg Intravenous Given 09/02/17 0831)  HYDROmorphone (DILAUDID) injection 0.5 mg (0.5 mg Intravenous Given 09/02/17 0832)  iopamidol (ISOVUE-300) 61 % injection (30 mLs Oral Contrast Given 09/02/17 1231)  iopamidol (ISOVUE-300) 61 % injection 100 mL (100 mLs Intravenous Contrast Given 09/02/17 1230)     Initial Impression / Assessment and Plan / ED Course  I have reviewed the triage vital signs and the nursing notes.  Pertinent labs & imaging results that were available during my care of the patient were reviewed by me and considered in my medical decision making (see chart for details).     Records reviewed. Followed by Dr. FOneida Alarfrom GI. Crohn's fairly well controlled. Afebrile, mildly tachycardic on arrival.  Normotensive. In no acute distress. Abdomen soft, non-peritoneal, with generalized tenderness, periumbilical is worse.  She is immunosuppressed, and although abdomen overall benign could be serious underlying pathology. Differential includes early appendicitis, crohn's flare. Will obtain CT abd/pelvis, basic blood work, inflammatory markers, UA.  Blood work, UA, inflammatory markers are all within normal limits. CT abdomen pelvis visualized and shows no acute intra-abdominal processes including Crohn's flare appendicitis. Discussed findings with patient. She'll follow-up with Dr. fOneida Alaras an outpatient for ongoing workup and management. Continued supportive care management for now at home. Strict return and follow-up instructions reviewed. She expressed understanding of all discharge instructions and felt comfortable with the plan of care.   Final Clinical Impressions(s) / ED Diagnoses   Final diagnoses:  Periumbilical abdominal pain    New Prescriptions New Prescriptions   No medications on file     LForde Dandy MD 09/02/17 1318

## 2017-09-02 NOTE — ED Notes (Signed)
Pt upset when discharging crying that she has been here for 5 hrs and no one can tell her what is wrong. Attempted to go over discharge instruction and pt did not want to listen to them

## 2017-09-02 NOTE — ED Notes (Signed)
Pt returned from CT °

## 2017-09-02 NOTE — ED Notes (Signed)
Witnessed Dilaudid given.  Would not scan.

## 2017-09-04 ENCOUNTER — Encounter: Payer: Self-pay | Admitting: Gastroenterology

## 2017-09-04 ENCOUNTER — Ambulatory Visit (INDEPENDENT_AMBULATORY_CARE_PROVIDER_SITE_OTHER): Payer: Medicare Other | Admitting: Gastroenterology

## 2017-09-04 ENCOUNTER — Other Ambulatory Visit: Payer: Self-pay

## 2017-09-04 VITALS — BP 119/79 | HR 104 | Temp 98.7°F | Ht 64.0 in | Wt 139.6 lb

## 2017-09-04 DIAGNOSIS — Z7689 Persons encountering health services in other specified circumstances: Secondary | ICD-10-CM

## 2017-09-04 DIAGNOSIS — R197 Diarrhea, unspecified: Secondary | ICD-10-CM | POA: Insufficient documentation

## 2017-09-04 MED ORDER — PROMETHAZINE HCL 25 MG PO TABS: ORAL_TABLET | ORAL | 0 refills | Status: DC

## 2017-09-04 MED ORDER — TRAMADOL HCL 50 MG PO TABS: 50.0000 mg | ORAL_TABLET | Freq: Four times a day (QID) | ORAL | 0 refills | Status: DC | PRN

## 2017-09-04 NOTE — Patient Instructions (Signed)
Please complete the stool studies today.   I have given you nausea medication and a short supply of pain medication. Do not take Imodium until we get the stool tests back.  I will talk with [name] about future Remicade dosing.  My email is [email or web address]

## 2017-09-04 NOTE — Telephone Encounter (Signed)
OFFER PT AN APPT THIS WEEK.

## 2017-09-04 NOTE — Telephone Encounter (Signed)
PT was seen by [name], NP this morning.

## 2017-09-04 NOTE — Progress Notes (Signed)
REVIEWED. IF PT FEELS HAVING BREAKTHROUGH SYMPTOMS PRIOR TO NEXT REMICADE DOSE, CHECK IMURAN METABOLITES.  Referring Provider: No ref. provider found Primary Care Physician:  Patient, No Pcp Per Primary GI: [name]   Chief Complaint  Patient presents with  . Abdominal Pain    x 1 week   . Diarrhea    x 3 days     HPI:   Kendra Franklin is a 26 y.o. female presenting today with a history of ileocolonic Crohn's disease, maintained on Imuran and Remicade. Last seen July 2018. Also has steroid-induced osteoporosis. Seen in ED over the weekend due to abdominal pain. CT without acute findings.   Diarrhea started Wednesday. Will have diarrhea about once a day. No rectal bleeding. Diffuse abdominal pain. Feels nauseated but no vomiting. No recent antibiotics.  Last dose of Remicade 2 months ago. Tentatively scheduled for Remicade on Wednesday but having insurance issues. Pain improved but still present. Feels more constant but was intermittent when originally presenting to the hospital. Prescribed Prilosec 20 mg daily.   Sees Dr. Dorris Fetch. Notices that she has these symptoms every 8 weeks. Only tylenol products.   Past Medical History:  Diagnosis Date  . Crohn's    DX   DEC 2011--- ASCA 100 pANCA NEG-APR 2012 6-TGN 233(LLN 230)  6-MMPN 1173 ON 75 MG IMURAN  . Crohn's disease, small and large intestine ([place])   . History of anal fissures   . History of anal lesion    2009   NON--HEALING  . History of kidney stones   . History of small intestine ulcer    2011    Past Surgical History:  Procedure Laterality Date  . COLONOSCOPY  DEC 2011   ILEO-COLONIC ULCERS  . COLONOSCOPY N/A 03/16/2015   SLF: 1/ The examined terminal ileum appeared to be normal 2. The left colon is redundatnt 3. Rectal bleeding due to small internal hemorrhoids  . CYSTOSCOPY WITH RETROGRADE PYELOGRAM, URETEROSCOPY AND STENT PLACEMENT Left 02/27/2014   Procedure: CYSTOSCOPY WITH RETROGRADE PYELOGRAM, URETEROSCOPY/ STENT  PLACEMENT;  Surgeon: Alexis Frock, MD;  Location[facility];  Service: Urology;  Laterality: Left;  . ESOPHAGOGASTRODUODENOSCOPY N/A 03/16/2015   SLF: 1. epigastric pain most likely due to gastritis/pyschosocial stressors, less likely GERD. 2. Mild non-erosive gastritis.   Marland Kitchen FLEXIBLE SIGMOIDOSCOPY N/A 12/11/2013   Procedure: FLEXIBLE SIGMOIDOSCOPY;  Surgeon: Danie Binder, MD;  [facility];  Service: Endoscopy;  Laterality: N/A;  1:45 PM  . GIVENS CAPSULE STUDY  DEC 2011   SB ULCERS  . HOLMIUM LASER APPLICATION Left 01/05/7866   Procedure: HOLMIUM LASER APPLICATION;  Surgeon: Alexis Frock, MD;  Location: [facility];  Service: Urology;  Laterality: Left;  . INCISION AND DRAINAGE PERIRECTAL ABSCESS  2009  &  2010    Current Outpatient Prescriptions  Medication Sig Dispense Refill  . azaTHIOprine (IMURAN) 50 MG tablet TAKE 2 TABLETS BY MOUTH EVERY DAY 60 tablet 5  . inFLIXimab (REMICADE) 100 MG injection Inject 300 mg into the vein every 8 (eight) weeks. LAST INJECTION NOV 2017    . medroxyPROGESTERone (DEPO-SUBQ PROVERA) 104 MG/0.65ML injection Inject 104 mg into the skin every 3 (three) months. LAST INJECTION FEB 2015    . OVER THE COUNTER MEDICATION Vitamin D chewables takes almost daily. (pt thinks dose is 500units)    . polyethylene glycol powder (GLYCOLAX/MIRALAX) powder MIX 17 GRAMS (MARKED ON CAP) WITH LIQUID AND DRINK ONCE DAILY AS NEEDED 527 g 5  .  valACYclovir (VALTREX) 1000 MG tablet Take 1,000 mg by mouth daily.    . promethazine (PHENERGAN) 25 MG tablet Take 1/2 tablet to 1 whole tablet every 8 hours. 20 tablet 0  . traMADol (ULTRAM) 50 MG tablet Take 1 tablet (50 mg total) by mouth every 6 (six) hours as needed. 15 tablet 0   No current facility-administered medications for this visit.    Facility-Administered Medications Ordered in Other Visits  Medication Dose Route Frequency Provider Last Rate Last Dose  . 0.9 %  sodium chloride  infusion   Intravenous Continuous Fields, Sandi L, MD 10 mL/hr at 09/06/17 1019    . inFLIXimab (REMICADE) 300 mg in sodium chloride 0.9 % 250 mL infusion  300 mg Intravenous Once Fields, Sandi L, MD 250 mL/hr at 09/06/17 1150 300 mg at 09/06/17 1150    Allergies as of 09/04/2017 - Review Complete 09/04/2017  Allergen Reaction Noted  . Morphine Hives     Family History  Problem Relation Age of Onset  . Diabetes Maternal Grandmother   . Colon cancer Neg Hx   . Colon polyps Neg Hx   . Inflammatory bowel disease Neg Hx     Social History   Social History  . Marital status: Single    Spouse name: N/A  . Number of children: 0  . Years of education: BSW   Occupational History  . Student    Social History Main Topics  . Smoking status: Never Smoker  . Smokeless tobacco: Never Used  . Alcohol use Yes     Comment: RARE  . Drug use: No  . Sexual activity: Not Asked   Other Topics Concern  . None   Social History Narrative   Graduated from [employer]. Straight A student.    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 No siblings. Mother smokes.   Lives at home w/ her mother   Right-handed   Caffeine: denies    Review of Systems: Gen: Denies fever, chills, anorexia. Denies fatigue, weakness, weight loss.  CV: Denies chest pain, palpitations, syncope, peripheral edema, and claudication. Resp: Denies dyspnea at rest, cough, wheezing, coughing up blood, and pleurisy. GI: see HPI  Derm: Denies rash, itching, dry skin Psych: Denies depression, anxiety, memory loss, confusion. No homicidal or suicidal ideation.  Heme: Denies bruising, bleeding, and enlarged lymph nodes.  Physical Exam: BP 119/79   Pulse (!) 104   Temp 98.7 F (37.1 C) (Oral)   Ht 5' 4"  (1.626 m)   Wt 139 lb 9.6 oz (63.3 kg)   BMI 23.96 kg/m  General:   Alert and oriented. No distress noted. Pleasant and cooperative.  Head:  Normocephalic and  atraumatic. Eyes:  Conjuctiva clear without scleral icterus. Mouth:  Oral mucosa pink and moist.  Abdomen:  +BS, soft, mild TTP lower abdomen and non-distended. No rebound or guarding. No HSM or masses noted. Msk:  Symmetrical without gross deformities. Normal posture. Extremities:  Without edema. Neurologic:  Alert and  oriented x4 Psych:  Alert and cooperative. Normal mood and affect.  Lab Results  Component Value Date   WBC 4.0 09/02/2017   HGB 13.3 09/02/2017   HCT 39.4 09/02/2017   MCV 98.7 09/02/2017   PLT 256 09/02/2017   Lab Results  Component Value Date   ALT 10 (L) 09/02/2017   AST 17 09/02/2017   ALKPHOS 50 09/02/2017   BILITOT 0.5 09/02/2017   Lab Results  Component Value Date   CREATININE 0.75 09/02/2017   BUN 7 09/02/2017   NA 142 09/02/2017   K 4.0 09/02/2017   CL 109 09/02/2017   CO2 26 09/02/2017

## 2017-09-05 ENCOUNTER — Ambulatory Visit: Payer: Medicare Other | Admitting: Family

## 2017-09-05 ENCOUNTER — Encounter: Payer: Self-pay | Admitting: General Practice

## 2017-09-05 NOTE — Telephone Encounter (Signed)
REVIEWED-NO ADDITIONAL RECOMMENDATIONS. 

## 2017-09-06 ENCOUNTER — Encounter (HOSPITAL_COMMUNITY): Payer: Self-pay

## 2017-09-06 ENCOUNTER — Encounter: Payer: Self-pay | Admitting: Gastroenterology

## 2017-09-06 ENCOUNTER — Encounter (HOSPITAL_COMMUNITY)
Admission: RE | Admit: 2017-09-06 | Discharge: 2017-09-06 | Disposition: A | Discharge status: Home / Self Care | Payer: Medicare Other | Source: Ambulatory Visit | Attending: Gastroenterology | Admitting: Gastroenterology

## 2017-09-06 DIAGNOSIS — K50913 Crohn's disease, unspecified, with fistula: Secondary | ICD-10-CM | POA: Insufficient documentation

## 2017-09-06 MED ORDER — LORATADINE 10 MG PO TABS
10.0000 mg | ORAL_TABLET | Freq: Once | ORAL | Status: AC
  Administered 2017-09-06: 10 mg via ORAL
  Filled 2017-09-06: qty 1

## 2017-09-06 MED ORDER — SODIUM CHLORIDE 0.9 % IV SOLN
INTRAVENOUS | Status: DC
  Administered 2017-09-06: 10:00:00 via INTRAVENOUS

## 2017-09-06 MED ORDER — SODIUM CHLORIDE 0.9 % IV SOLN
300.0000 mg | Freq: Once | INTRAVENOUS | Status: AC
  Administered 2017-09-06: 300 mg via INTRAVENOUS
  Filled 2017-09-06: qty 30

## 2017-09-06 MED ORDER — ACETAMINOPHEN 325 MG PO TABS
650.0000 mg | ORAL_TABLET | Freq: Once | ORAL | Status: AC
  Administered 2017-09-06: 650 mg via ORAL
  Filled 2017-09-06: qty 2

## 2017-09-06 NOTE — Telephone Encounter (Signed)
Completed.

## 2017-09-06 NOTE — Progress Notes (Signed)
No pcp per patient 

## 2017-09-06 NOTE — Telephone Encounter (Signed)
Paperwork to [employer].

## 2017-09-06 NOTE — Telephone Encounter (Signed)
REVIEWED. IF PT FEELS HAVING BREAKTHROUGH SYMPTOMS PRIOR TO NEXT REMICADE DOSE, CHECK IMURAN METABOLITES.

## 2017-09-06 NOTE — Assessment & Plan Note (Signed)
History of ileocolonic Crohn's disease on Imuran and Remicade: she is about a week or so overdue for Remicade dosing and notes abdominal pain and flare of diarrhea every 8 weeks or so. Highly doubt infectious process but stool studies provided in case diarrhea worsens. CT recently without acute findings. As per [name]' recommendations, check Imuran metabolites now. Supportive measures in interim. Further recommendations to follow.

## 2017-09-06 NOTE — Telephone Encounter (Signed)
Perfect! Kendra Franklin: I need to get with you on the labs for this.

## 2017-09-07 ENCOUNTER — Ambulatory Visit: Payer: Medicare Other | Admitting: "Endocrinology

## 2017-09-08 ENCOUNTER — Inpatient Hospital Stay (HOSPITAL_COMMUNITY): Admission: RE | Admit: 2017-09-08 | Payer: Medicare Other | Source: Ambulatory Visit

## 2017-09-11 NOTE — Telephone Encounter (Signed)
No. Needs to keep on Remicade dosing. Needs to make sure she keeps those appts as best as possible.

## 2017-09-11 NOTE — Telephone Encounter (Signed)
Pt is aware to pick up kit at front desk and take to [facility]. She would like to know if her orders for Remicade have been changed from 8 weeks to 6 weeks. Please advise! 

## 2017-09-11 NOTE — Telephone Encounter (Signed)
Pt is aware.  

## 2017-09-12 NOTE — Telephone Encounter (Signed)
REVIEWED-NO ADDITIONAL RECOMMENDATIONS. 

## 2017-09-14 ENCOUNTER — Encounter: Payer: Self-pay | Admitting: Gastroenterology

## 2017-09-14 NOTE — Progress Notes (Signed)
Letter completed per patient request.

## 2017-09-18 ENCOUNTER — Encounter: Payer: Self-pay | Admitting: Gastroenterology

## 2017-09-18 DIAGNOSIS — K50913 Crohn's disease, unspecified, with fistula: Secondary | ICD-10-CM | POA: Diagnosis not present

## 2017-09-20 ENCOUNTER — Other Ambulatory Visit: Payer: Self-pay | Admitting: *Deleted

## 2017-09-20 ENCOUNTER — Encounter: Payer: Self-pay | Admitting: Gastroenterology

## 2017-09-20 NOTE — Telephone Encounter (Signed)
Received fax from pharmacy requesting a 90 day supply of Azathioprine 50 mg

## 2017-09-21 MED ORDER — AZATHIOPRINE 50 MG PO TABS: 100.0000 mg | ORAL_TABLET | Freq: Every day | ORAL | 3 refills | Status: DC

## 2017-09-25 ENCOUNTER — Telehealth: Payer: Self-pay | Admitting: Gastroenterology

## 2017-09-25 NOTE — Telephone Encounter (Signed)
Thiopurine metabolites received. 6-TGN metabolites levels less than 230, indicating possible missed dosing, non-compliance. She has 6-MMPN metabolite levels that remain less than 5700 (2332), indicating decreased risk of hepatotoxicity.   Basically;  1. Please let patient know that from her blood work, it appears that she may not be getting doses daily. Ask her if she is taking absolutely every day?  2. We will follow clinically. Could consider increasing Imuran BUT we need to make sure she is taking it daily.   Scanning into EMR>

## 2017-09-26 NOTE — Telephone Encounter (Signed)
LMOM to call.

## 2017-09-26 NOTE — Telephone Encounter (Signed)
Pt said she is definitely sure that she has not missed any doses of the Imuran.

## 2017-10-16 NOTE — Telephone Encounter (Signed)
[name]: any further recommendations?

## 2017-10-17 NOTE — Telephone Encounter (Signed)
What do you think we should do?

## 2017-10-25 NOTE — Telephone Encounter (Signed)
Pt is aware. Said she is not having any lapses in the Remicade. She is willing to increase the Imuran and check metabolites as needed. Her last Remicade was 09/04/2017 and next one scheduled for 11/01/2017. She said she began having a few symptoms as usual between the doses of Remicade. Started some abdominal pain and nausea on the day before Thanksgiving.

## 2017-10-25 NOTE — Telephone Encounter (Signed)
Kendra Franklin: Let's make sure patient is getting her Remicade injections every 8 weeks and not having any lapses. We could consider increasing Imuran from 100 to 125 and monitor metabolites. Would she be willing to do this? It's absolutely important that she takes this every single day and does not miss any doses, which she said she is doing.

## 2017-10-30 MED ORDER — AZATHIOPRINE 50 MG PO TABS: 125.0000 mg | ORAL_TABLET | Freq: Every day | ORAL | 5 refills | Status: DC

## 2017-10-30 NOTE — Telephone Encounter (Signed)
Please also have LFTs drawn 1 week after starting new dosage (with CBC), then check again with CBC in 6 weeks.

## 2017-10-30 NOTE — Telephone Encounter (Signed)
I have increased Imuran to 125 mg daily. This means 2.5 tablets daily. She needs serial blood work as follows:  1. CBC one week after starting the new dosage, then every week for a total of 4, followed by CBC every 2 weeks for one month. We will check metabolites in 2-3 months.   Needs appt in 6 weeks.

## 2017-10-31 MED ORDER — AZATHIOPRINE 50 MG PO TABS: 125.0000 mg | ORAL_TABLET | Freq: Every day | ORAL | 5 refills | Status: DC

## 2017-10-31 NOTE — Telephone Encounter (Signed)
PT is aware of the plan. Rx was sent to [facility] and [facility] [place]. Vicente Males, can you sent to [facility]. She is aware of the labs and I will mail the orders to her.

## 2017-10-31 NOTE — Telephone Encounter (Signed)
Done

## 2017-10-31 NOTE — Addendum Note (Signed)
Addended by: Annitta Needs on: 10/31/2017 03:19 PM   Modules accepted: Orders

## 2017-11-01 ENCOUNTER — Encounter (HOSPITAL_COMMUNITY)
Admission: RE | Admit: 2017-11-01 | Discharge: 2017-11-01 | Disposition: A | Discharge status: Home / Self Care | Payer: Medicare Other | Source: Ambulatory Visit | Attending: Gastroenterology | Admitting: Gastroenterology

## 2017-11-01 ENCOUNTER — Other Ambulatory Visit: Payer: Self-pay

## 2017-11-01 DIAGNOSIS — K50913 Crohn's disease, unspecified, with fistula: Secondary | ICD-10-CM | POA: Diagnosis not present

## 2017-11-01 MED ORDER — LORATADINE 10 MG PO TABS
10.0000 mg | ORAL_TABLET | Freq: Once | ORAL | Status: AC
  Administered 2017-11-01: 10 mg via ORAL
  Filled 2017-11-01: qty 1

## 2017-11-01 MED ORDER — SODIUM CHLORIDE 0.9 % IV SOLN
Freq: Once | INTRAVENOUS | Status: AC
  Administered 2017-11-01: 250 mL via INTRAVENOUS

## 2017-11-01 MED ORDER — ACETAMINOPHEN 325 MG PO TABS
650.0000 mg | ORAL_TABLET | Freq: Once | ORAL | Status: AC
  Administered 2017-11-01: 650 mg via ORAL
  Filled 2017-11-01: qty 2

## 2017-11-01 MED ORDER — SODIUM CHLORIDE 0.9 % IV SOLN
300.0000 mg | Freq: Once | INTRAVENOUS | Status: AC
  Administered 2017-11-01: 300 mg via INTRAVENOUS
  Filled 2017-11-01: qty 30

## 2017-11-01 NOTE — Telephone Encounter (Addendum)
Needs to have labs as follows:  CBC one week after staring new dosage (so about a week from now). Then, needs a CBC weekly X 3. Then, needs a CBC, LFTs at 6 weeks and only CBC at  8 weeks. We will check metabolites in 2-3 months.   So, something like this:  1st CBC Dec 12th, then Dec 19, 26th, and Jan 2nd. Then, CBC and HFP Jan 16th, CBC only Jan 30th.

## 2017-11-01 NOTE — Telephone Encounter (Signed)
Vicente Males, I have mailed the first lab orders to pt for the LFT's and CBC in one week. Please clarify how to order the additional labs. Thanks!

## 2017-11-01 NOTE — Telephone Encounter (Signed)
Back to [place].

## 2017-11-02 ENCOUNTER — Other Ambulatory Visit: Payer: Self-pay

## 2017-11-02 DIAGNOSIS — K50913 Crohn's disease, unspecified, with fistula: Secondary | ICD-10-CM

## 2017-11-02 DIAGNOSIS — K601 Chronic anal fissure: Secondary | ICD-10-CM

## 2017-11-02 NOTE — Telephone Encounter (Signed)
I have completed the lab orders to mail to pt. I called her and she has not yet started the increase dosage of the Imuran. She said [facility] still did not [facility] the new prescription. I called and spoke to the pharmacist, Dorothea Ogle, and he said it is showing in [place]. He will call them and get it taken care of. Pt is aware she should increase what she has to 2 and one half tablets ( 50 mg tabs) to equal 125 mg daily, so she can get on schedule for her labs. She was informed to do 2 days after the dates written on the labs since she had not started. She expressed understanding. I am placing the lab orders in the mail now.

## 2017-11-08 ENCOUNTER — Telehealth: Payer: Self-pay | Admitting: Gastroenterology

## 2017-11-08 NOTE — Telephone Encounter (Signed)
Pt called asking for DS. I told her DS was busy and I would have to take a message. Pt wants to talk to DS about increasing her meds. Please call her back at [phone]

## 2017-11-09 DIAGNOSIS — Z3042 Encounter for surveillance of injectable contraceptive: Secondary | ICD-10-CM | POA: Diagnosis not present

## 2017-11-09 NOTE — Telephone Encounter (Signed)
LMOM to call.

## 2017-11-09 NOTE — Telephone Encounter (Signed)
Since returning to original dosing, has diarrhea resolved? I am not entirely convinced it was related to dose adjustment.

## 2017-11-09 NOTE — Telephone Encounter (Signed)
Pt said she stopped the medication for a couple of days til diarrhea improved. She is back on the regular dose now for 2 days and no diarrhea.

## 2017-11-09 NOTE — Telephone Encounter (Signed)
Pt said she took the 2 and a half pills of Imuran ( 125 mg) for 2 days. She had diarrhea so bad while she was out shopping and had to rush to a bathroom. She had 5 episodes in one day so she has not taken the increased dose since. Please advise!

## 2017-11-14 NOTE — Telephone Encounter (Signed)
Pt said she will start it on Friday and give it another try.

## 2017-11-14 NOTE — Telephone Encounter (Signed)
See if she would be willing to try again at the increased dosage to see if diarrhea was truly related to that.

## 2017-11-27 ENCOUNTER — Other Ambulatory Visit: Payer: Self-pay

## 2017-11-27 ENCOUNTER — Encounter: Payer: Self-pay | Admitting: Family Medicine

## 2017-11-27 ENCOUNTER — Other Ambulatory Visit (HOSPITAL_COMMUNITY)
Admission: RE | Admit: 2017-11-27 | Discharge: 2017-11-27 | Disposition: A | Discharge status: Home / Self Care | Payer: Medicare Other | Source: Ambulatory Visit | Attending: Gastroenterology | Admitting: Gastroenterology

## 2017-11-27 ENCOUNTER — Ambulatory Visit (INDEPENDENT_AMBULATORY_CARE_PROVIDER_SITE_OTHER): Payer: Medicare Other | Admitting: Family Medicine

## 2017-11-27 ENCOUNTER — Telehealth: Payer: Self-pay | Admitting: Gastroenterology

## 2017-11-27 ENCOUNTER — Other Ambulatory Visit: Payer: Self-pay | Admitting: Gastroenterology

## 2017-11-27 VITALS — BP 122/76 | HR 88 | Temp 99.0°F | Resp 18 | Ht 64.0 in | Wt 136.0 lb

## 2017-11-27 DIAGNOSIS — T380X5A Adverse effect of glucocorticoids and synthetic analogues, initial encounter: Secondary | ICD-10-CM

## 2017-11-27 DIAGNOSIS — K50913 Crohn's disease, unspecified, with fistula: Secondary | ICD-10-CM

## 2017-11-27 DIAGNOSIS — B001 Herpesviral vesicular dermatitis: Secondary | ICD-10-CM | POA: Insufficient documentation

## 2017-11-27 DIAGNOSIS — R112 Nausea with vomiting, unspecified: Secondary | ICD-10-CM

## 2017-11-27 DIAGNOSIS — Z23 Encounter for immunization: Secondary | ICD-10-CM

## 2017-11-27 DIAGNOSIS — M818 Other osteoporosis without current pathological fracture: Secondary | ICD-10-CM | POA: Diagnosis not present

## 2017-11-27 LAB — URINALYSIS, COMPLETE (UACMP) WITH MICROSCOPIC
Bilirubin Urine: NEGATIVE
GLUCOSE, UA: NEGATIVE mg/dL
Hgb urine dipstick: NEGATIVE
KETONES UR: NEGATIVE mg/dL
Leukocytes, UA: NEGATIVE
Nitrite: NEGATIVE
PH: 6 (ref 5.0–8.0)
Protein, ur: NEGATIVE mg/dL
SPECIFIC GRAVITY, URINE: 1.019 (ref 1.005–1.030)

## 2017-11-27 LAB — CBC WITH DIFFERENTIAL/PLATELET
BASOS PCT: 0 %
Basophils Absolute: 0 10*3/uL (ref 0.0–0.1)
EOS PCT: 1 %
Eosinophils Absolute: 0.1 10*3/uL (ref 0.0–0.7)
HEMATOCRIT: 38.2 % (ref 36.0–46.0)
Hemoglobin: 12.6 g/dL (ref 12.0–15.0)
Lymphocytes Relative: 32 %
Lymphs Abs: 1.2 10*3/uL (ref 0.7–4.0)
MCH: 32.9 pg (ref 26.0–34.0)
MCHC: 33 g/dL (ref 30.0–36.0)
MCV: 99.7 fL (ref 78.0–100.0)
MONO ABS: 0.4 10*3/uL (ref 0.1–1.0)
MONOS PCT: 9 %
NEUTROS ABS: 2.2 10*3/uL (ref 1.7–7.7)
Neutrophils Relative %: 58 %
Platelets: 271 10*3/uL (ref 150–400)
RBC: 3.83 MIL/uL — ABNORMAL LOW (ref 3.87–5.11)
RDW: 13.3 % (ref 11.5–15.5)
WBC: 3.8 10*3/uL — ABNORMAL LOW (ref 4.0–10.5)

## 2017-11-27 LAB — COMPREHENSIVE METABOLIC PANEL
ALBUMIN: 4.4 g/dL (ref 3.5–5.0)
ALK PHOS: 50 U/L (ref 38–126)
ALT: 11 U/L — AB (ref 14–54)
ANION GAP: 11 (ref 5–15)
AST: 20 U/L (ref 15–41)
BUN: 6 mg/dL (ref 6–20)
CALCIUM: 9.6 mg/dL (ref 8.9–10.3)
CO2: 22 mmol/L (ref 22–32)
Chloride: 108 mmol/L (ref 101–111)
Creatinine, Ser: 0.67 mg/dL (ref 0.44–1.00)
GFR calc Af Amer: >60 mL/min (ref >60)
GFR calc non Af Amer: >60 mL/min (ref >60)
GLUCOSE: 94 mg/dL (ref 65–99)
Potassium: 3.8 mmol/L (ref 3.5–5.1)
SODIUM: 141 mmol/L (ref 135–145)
Total Bilirubin: 1.3 mg/dL — ABNORMAL HIGH (ref 0.3–1.2)
Total Protein: 8.5 g/dL — ABNORMAL HIGH (ref 6.5–8.1)

## 2017-11-27 LAB — LIPASE, BLOOD: Lipase: 49 U/L (ref 11–51)

## 2017-11-27 MED ORDER — ONDANSETRON HCL 4 MG PO TABS: 4.0000 mg | ORAL_TABLET | Freq: Three times a day (TID) | ORAL | 1 refills | Status: DC | PRN

## 2017-11-27 MED ORDER — NITROFURANTOIN MONOHYD MACRO 100 MG PO CAPS: 100.0000 mg | ORAL_CAPSULE | Freq: Two times a day (BID) | ORAL | 0 refills | Status: AC

## 2017-11-27 NOTE — Telephone Encounter (Addendum)
REVIEWED. PT LAST SEEN BY [facility] JAN 2018. WEIGHT 139 LBS. LAST VISIT OCT 2018 WEIGHT 139 LBS. PT HAD IMURAN METABOLITES PERFORMED AND HER LEVEL WAS ZERO. DOSE ADJUSTED TO IMURAN 150 MG DAILY @ DEC 12. SHE STOPPED MEDS ON HER OWN BECAUSE SHE FELT IT WAS CAUSING THEY WERE CAUSING DIARRHEA. CONTINUES TO RECEIVE REMICADE.   PLEASE CALL PT. IT TAKES IMURAN 2-3 MOS TO BECOME THERAPEUTIC. SHE SHOULD RE-START IMURAN AT 100 MG DAILY. SHE NEEDS TO COMPLETE A BLOOD DRAW FOR CMP/CBC/LIPASE, AND SUBMIT URINE SAMPLE(UA WITH MICRO) TODAY. SHE CAN HAVE A FOLLOW UP APPT JAN 9@1130 .

## 2017-11-27 NOTE — Telephone Encounter (Signed)
Spoke with pt and she is not happy with her care at [facility]. Pt feels her symptoms and feelings are being dismissed. Pt states that she has been sick for 1 month straight. Pt called with c/o nausea, vomiting x 2 days, loss of appetite. Pt called last week and was asked to start her normal dosage of Imuran 69m (2.5 tabs) daily as directed. Before pt called she was taking only 2 pills of Imuran daily. Pt started back taking 2.5 tabs of Imuran and hasn't seen an improvement. Pt also gets Remicade injections as directed. Pt cant take Phenergan for nausea due to it making her too tired. Pt isn't able to keep solid foods down but can tolerate soup. Diarrhea comes and goes.   Pt is being had another appointment and had to end call.

## 2017-11-27 NOTE — Progress Notes (Signed)
Chief Complaint  Patient presents with  . Crohn's Disease    est care  This is a new patient.  She is under the care of Dr. Trinda Pascal for Crohn's disease.  She is on azathioprine and Remicade.  In spite of this she continues to have abdominal pain and diarrhea.  She is scheduled to see [name] again on January 9. She has been diagnosed with osteoporosis.  This is presumed due to steroids, although chart review indicates she has not had enough steroid to really cause this.  She may all so have malabsorption involved with her known Crohn's disease.  She does not eat enough calcium.  She does not get weightbearing exercise.  She has seen Dr. Dorris Fetch in endocrinology.  He prescribed vitamin D3 5000 units a day and alendronate.  She is not taking these medications either.  We had a long discussion about her taking an active role in the treatment of her osteoporosis since she is quite young.  I told her she had the "bones of an old lady".  She may need a nutrition consultation to learn more about diet and calcium intake. She does have a prescription for Valtrex for herpes labialis recurrent She is under the care of a GYN.  She is getting quarterly Depo-Provera shots.  She has been doing this since the age of 28.  There is an association with Depo-Provera and bone loss, although not proven.  I believe with her known osteoporosis she may need to have an alternate birth control.  I will discuss this with Dr. Dorris Fetch.  She is willing to change birth control if it is recommended for her. She has not had a primary care doctor since 2014.  Previously went to [facility] family practice.  Those records are reviewed. She believes her immunizations are up-to-date.  She had a flu shot and to pneumonia shots this year. She goes to [facility] health provider.  She believes her Pap is up-to-date.     Patient Active Problem List   Diagnosis Date Noted  . 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 Diarrhea 09/04/2017  .  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 AP (abdominal pain)   . Crohn's disease of intestine with fistula ([place]) 12/23/2010  . 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 azaTHIOprine (IMURAN) 50 MG tablet Take 2.5 tablets (125 mg total) by mouth daily.  Marland Kitchen inFLIXimab (REMICADE) 100 MG injection Inject 300 mg into the vein every 8 (eight) weeks. LAST INJECTION NOV 2017  . medroxyPROGESTERone (DEPO-SUBQ PROVERA) 104 MG/0.65ML injection Inject 104 mg into the skin every 3 (three) months. LAST INJECTION FEB 2015  . polyethylene glycol powder (GLYCOLAX/MIRALAX) powder MIX 17 GRAMS (MARKED ON CAP) WITH LIQUID AND DRINK ONCE DAILY AS NEEDED  . promethazine (PHENERGAN) 25 MG tablet Take 1/2 tablet to 1 whole tablet every 8 hours.  . traMADol (ULTRAM) 50 MG tablet Take 1 tablet (50 mg total) by mouth every 6 (six) hours as needed.  . valACYclovir (VALTREX) 1000 MG tablet Take 1,000 mg by mouth daily.   No facility-administered encounter medications on file as of 11/27/2017.     Past Medical History:  Diagnosis Date  . Allergy    medicine  . Bloody diarrhea 03/11/2015  . Crohn's    DX   DEC 2011--- ASCA 100 pANCA NEG-APR 2012 6-TGN 233(LLN 230)  6-MMPN 1173 ON 75 MG IMURAN  . Crohn's disease, small and large intestine (  [place])   . History of anal fissures   . History of anal lesion    2009   NON--HEALING  . History of kidney stones   . History of small intestine ulcer    2011  . Osteoporosis     Past Surgical History:  Procedure Laterality Date  . COLONOSCOPY  DEC 2011   ILEO-COLONIC ULCERS  . COLONOSCOPY N/A 03/16/2015   SLF: 1/ The examined terminal ileum appeared to be normal 2. The left colon is redundatnt 3. Rectal bleeding due to small internal hemorrhoids  . CYSTOSCOPY WITH RETROGRADE PYELOGRAM, URETEROSCOPY AND STENT PLACEMENT Left 02/27/2014   Procedure: CYSTOSCOPY WITH RETROGRADE PYELOGRAM, URETEROSCOPY/ STENT  PLACEMENT;  Surgeon: Alexis Frock, MD;  Location: [facility];  Service: Urology;  Laterality: Left;  . ESOPHAGOGASTRODUODENOSCOPY N/A 03/16/2015   SLF: 1. epigastric pain most likely due to gastritis/pyschosocial stressors, less likely GERD. 2. Mild non-erosive gastritis.   Marland Kitchen FLEXIBLE SIGMOIDOSCOPY N/A 12/11/2013   Procedure: FLEXIBLE SIGMOIDOSCOPY;  Surgeon: Danie Binder, MD;  [facility];  Service: Endoscopy;  Laterality: N/A;  1:45 PM  . GIVENS CAPSULE STUDY  DEC 2011   SB ULCERS  . HOLMIUM LASER APPLICATION Left 4/0/1027   Procedure: HOLMIUM LASER APPLICATION;  Surgeon: Alexis Frock, MD;  Location[facility];  Service: Urology;  Laterality: Left;  . INCISION AND DRAINAGE PERIRECTAL ABSCESS  2009  &  2010    Social History   Socioeconomic History  . Marital status: Single    Spouse name: Not on file  . Number of children: 0  . Years of education: BSW  . Highest education level: Bachelor's degree (e.g., BA, AB, BS)  Social Needs  . Financial resource strain: Not on file  . Food insecurity - worry: Not on file  . Food insecurity - inability: Not on file  . Transportation needs - medical: Not on file  . Transportation needs - non-medical: Not on file  Occupational History  . Occupation: Ship broker  Tobacco Use  . Smoking status: Never Smoker  . Smokeless tobacco: Never Used  Substance and Sexual Activity  . Alcohol use: Yes    Comment: RARE  . Drug use: No  . Sexual activity: Not Currently  Other Topics Concern  . Not on file  Social History Narrative   Graduated from [employer]. Straight A student.    Now at [employer] in Masters of Gerontology program (as of Oct 2536)     Mother: Arlyss Repress. No siblings. Mother smokes.   Lives at home w/ her mother   Right-handed   Caffeine: denies    Family History  Problem Relation Age of Onset  . Diabetes Maternal Grandmother   . Colon cancer Neg Hx   . Colon polyps Neg Hx   .  Inflammatory bowel disease Neg Hx     Review of Systems  Constitutional: Negative for chills, fever and weight loss.  HENT: Negative for congestion and hearing loss.   Eyes: Negative for blurred vision and pain.  Respiratory: Negative for cough and shortness of breath.   Cardiovascular: Negative for chest pain and leg swelling.  Gastrointestinal: Positive for diarrhea and nausea. Negative for abdominal pain, constipation and heartburn.       Abdominal pain  Genitourinary: Negative for dysuria and frequency.  Musculoskeletal: Negative for falls, joint pain and myalgias.  Neurological: Negative for dizziness, seizures and headaches.  Psychiatric/Behavioral: Negative for depression. The patient is not nervous/anxious and does not have insomnia.  BP 122/76 (BP Location: Right Arm, Patient Position: Sitting, Cuff Size: Normal)   Pulse 88   Temp 99 F (37.2 C) (Temporal)   Resp 18   Ht 5' 4"  (1.626 m)   Wt 136 lb 0.6 oz (61.7 kg)   SpO2 99%   BMI 23.35 kg/m   Physical Exam  Constitutional: She is oriented to person, place, and time. She appears well-developed and well-nourished.  HENT:  Head: Normocephalic and atraumatic.  Mouth/Throat: Oropharynx is clear and moist.  Eyes: Conjunctivae are normal. Pupils are equal, round, and reactive to light.  Neck: Normal range of motion. Neck supple. No thyromegaly present.  Cardiovascular: Normal rate, regular rhythm and normal heart sounds.  Pulmonary/Chest: Effort normal and breath sounds normal. No respiratory distress.  Abdominal: Soft. Bowel sounds are normal.  Musculoskeletal: Normal range of motion. She exhibits no edema.  Lymphadenopathy:    She has no cervical adenopathy.  Neurological: She is alert and oriented to person, place, and time.  Gait normal  Skin: Skin is warm and dry.  Psychiatric: She has a normal mood and affect. Her behavior is normal. Thought content normal.  Nursing note and vitals reviewed. No abdomen  tenderness or organomegaly ASSESSMENT/PLAN:   1. Need for influenza vaccination done - Flu Vaccine QUAD 36+ mos IM  2. Steroid-induced osteoporosis Gust the importance of calcium rich diet.  Regular weightbearing exercise.  Vitamin D replacement.  Alendronate.  3. Crohn's disease of intestine with fistula ([place]) Under care of [name].  4. Recurrent herpes labialis Valtrex Greater than 50% of this visit was spent in counseling and coordinating care.  Total face to face time: 45 minutes.  We discussed lifestyle diet exercise prevention of fracture/bone disease     Patient Instructions  I will contact Dr [name] Increase the calcium in the diet Walk every day that you are able  We will get old shot records Take a multivitamin every day I will investigate the depo shots  See me in a month or two   Raylene Everts, MD

## 2017-11-27 NOTE — Telephone Encounter (Signed)
PER [place]ILLE ROUTING TO [facility] FOR ADVICE ON WHERE TO SCHEDULE

## 2017-11-27 NOTE — Addendum Note (Signed)
Addended by: Annitta Needs on: 11/27/2017 10:07 AM   Modules accepted: Orders

## 2017-11-27 NOTE — Telephone Encounter (Signed)
REVIEWED-NO ADDITIONAL RECOMMENDATIONS. 

## 2017-11-27 NOTE — Telephone Encounter (Signed)
Patient is made aware of SLFs recommendation and appt with her on 1/9 at 11:30.    She is on her way to have labs drawn.

## 2017-11-27 NOTE — Telephone Encounter (Signed)
Pt called this morning asking if we had any openings today. I told her if she could be here at 9am we could get her in with EG. She said she had another appointment at [place] and did we have anything later today. I told her the next available would be late February and January was completely booked unless we get cancellations. She then asked to speak to "Abigail Butts" and I told her we don't have anyone here by that nurse. She said she needed to speak to [facility] or [facility] nurse. I told her SF wasn't in the office today and DS wouldn't be back until Wednesday. She asked to go to DS VM. I transferred call. [phone]

## 2017-11-27 NOTE — Telephone Encounter (Signed)
PLEASE CALL PT. CALL PT. SHE HAS some blood in YOUR urine AND a few white cells. SHE MAY HAVE A BLADDER INFECTION. HER LABS ARE OTHERWISE UNREMARKABLE AND THE OTHER HIGHLIGHTED VALUES ARE NOT CLINICALLY SIGNIFICANT.   DRINK WATER TO KEEP YOUR URINE LIGHT YELLOW. SHE NEEDS MACROBID BID FOR 3 DAYS. THE PRESCRIPTION HAS BEEN SENT [facility]. USE ZOFRAN IF NEEDED FOR NAUSEA/VOMITING. PLEASE CALL THUR JAN 3 IF SHE IS NOT FEELING SIGNIFICANTLY BETTER.

## 2017-11-27 NOTE — Telephone Encounter (Signed)
I am so sorry to hear this. I don't have any phone notes documenting vomiting, nausea, loss of appetite. I know that she had diarrhea for 2 days when increasing the Imuran, and she stopped on her own. She then was asked to restart, as I was not convinced the dosage change was the culprit for the isolated incidence of diarrhea.   These symptoms definitely need further evaluation if she can't advance her diet. Follow a soft diet. I am sending in Zofran to take, sent to [facility] in [place]. We need to get her in for an office visit, may use urgent. She may need updated imaging.

## 2017-11-27 NOTE — Telephone Encounter (Signed)
Spoke with pt and her pharmacy has changed to [facility]. Will resend medication Zofran. Will mail info on soft diet to pt.   Please schedule office visit, can use urgent if non are available per AB.

## 2017-11-27 NOTE — Patient Instructions (Signed)
I will contact Dr [name] Increase the calcium in the diet Walk every day that you are able  We will get old shot records Take a multivitamin every day I will investigate the depo shots  See me in a month or two

## 2017-11-27 NOTE — Telephone Encounter (Signed)
Routing to Stacey  

## 2017-11-27 NOTE — Telephone Encounter (Signed)
Noted. Pt was notified and will call back for an update if not better on Nov 30, 2017.

## 2017-11-29 ENCOUNTER — Ambulatory Visit: Payer: Medicare Other | Admitting: Gastroenterology

## 2017-11-29 ENCOUNTER — Telehealth: Payer: Self-pay

## 2017-11-29 DIAGNOSIS — R1032 Left lower quadrant pain: Secondary | ICD-10-CM

## 2017-11-29 NOTE — Telephone Encounter (Addendum)
PLEASE CALL PT. CONTINUE IMURAN AND ZOFRAN PRN. CONTINUE FULL LIQUID DIET AND TRY SOFT FOODS LIKE RICE, PASTA, OR MASHED POTATOES. PT NEEDS CT SCAN ABD/PELVIS W/ IV AND ORAL CONTRAST JAN 3, Dx: ABDOMINAL PAIN, ANOREXIA, CROHN'S DISEASE, EVALUATE FOR SMALL BOWEL OBSTRUCTION.

## 2017-11-29 NOTE — Telephone Encounter (Signed)
Pt called and said she went back on the Imuran 100 mg as recommended by [name].  She has no appetite, when she tries to eat she gets very nauseated and tummy feels upset, but no diarrhea. She has only had a little soup and a couple of Ensures and water for the last 4 days. She said she doesn't feel she is dehydrated, she thinks she has had enough liquids. Previously she had been fine on the Imuran 100 mg. Please advise!

## 2017-11-29 NOTE — Telephone Encounter (Signed)
Pt is aware and OK to schedule CT.

## 2017-11-29 NOTE — Telephone Encounter (Signed)
CT scheduled for 11/30/17 at 12:00pm, arrival time 11:45am, npo 4 hours prior to test. Pt needs to go by [facility] to pick up oral contrast with directions.  Called made pt aware of appt details/pick up prep. She verbalized understanding and needed nothing further.

## 2017-11-30 ENCOUNTER — Ambulatory Visit (HOSPITAL_COMMUNITY)
Admission: RE | Admit: 2017-11-30 | Discharge: 2017-11-30 | Disposition: A | Discharge status: Home / Self Care | Payer: Medicare Other | Source: Ambulatory Visit | Attending: Gastroenterology | Admitting: Gastroenterology

## 2017-11-30 DIAGNOSIS — N200 Calculus of kidney: Secondary | ICD-10-CM | POA: Insufficient documentation

## 2017-11-30 DIAGNOSIS — R1032 Left lower quadrant pain: Secondary | ICD-10-CM | POA: Diagnosis not present

## 2017-11-30 DIAGNOSIS — N281 Cyst of kidney, acquired: Secondary | ICD-10-CM | POA: Insufficient documentation

## 2017-11-30 LAB — POCT PREGNANCY, URINE: Preg Test, Ur: NEGATIVE

## 2017-11-30 MED ORDER — IOPAMIDOL (ISOVUE-300) INJECTION 61%
100.0000 mL | Freq: Once | INTRAVENOUS | Status: AC | PRN
  Administered 2017-11-30: 100 mL via INTRAVENOUS

## 2017-12-01 ENCOUNTER — Encounter: Payer: Self-pay | Admitting: Family Medicine

## 2017-12-01 ENCOUNTER — Other Ambulatory Visit: Payer: Self-pay

## 2017-12-01 NOTE — Telephone Encounter (Signed)
I did call [facility] and told her the d 3 is otc.

## 2017-12-01 NOTE — Progress Notes (Signed)
PT is aware of her appt on 12/06/2017 at 11:30 Am.

## 2017-12-04 MED ORDER — ALENDRONATE SODIUM 70 MG PO TABS
70.0000 mg | ORAL_TABLET | ORAL | 3 refills | Status: DC
Start: 2017-12-04 — End: 2018-09-11

## 2017-12-06 ENCOUNTER — Ambulatory Visit (INDEPENDENT_AMBULATORY_CARE_PROVIDER_SITE_OTHER): Payer: Medicare Other | Admitting: Gastroenterology

## 2017-12-06 ENCOUNTER — Encounter: Payer: Self-pay | Admitting: Gastroenterology

## 2017-12-06 ENCOUNTER — Other Ambulatory Visit: Payer: Self-pay

## 2017-12-06 DIAGNOSIS — R1115 Cyclical vomiting syndrome unrelated to migraine: Secondary | ICD-10-CM

## 2017-12-06 DIAGNOSIS — R110 Nausea: Secondary | ICD-10-CM | POA: Insufficient documentation

## 2017-12-06 DIAGNOSIS — R112 Nausea with vomiting, unspecified: Secondary | ICD-10-CM | POA: Insufficient documentation

## 2017-12-06 DIAGNOSIS — K50913 Crohn's disease, unspecified, with fistula: Secondary | ICD-10-CM

## 2017-12-06 DIAGNOSIS — R197 Diarrhea, unspecified: Secondary | ICD-10-CM

## 2017-12-06 DIAGNOSIS — G43A1 Cyclical vomiting, intractable: Secondary | ICD-10-CM | POA: Diagnosis not present

## 2017-12-06 DIAGNOSIS — R634 Abnormal weight loss: Secondary | ICD-10-CM

## 2017-12-06 DIAGNOSIS — R109 Unspecified abdominal pain: Secondary | ICD-10-CM

## 2017-12-06 MED ORDER — AZATHIOPRINE 50 MG PO TABS: 100.0000 mg | ORAL_TABLET | Freq: Every day | ORAL | 11 refills | Status: DC

## 2017-12-06 MED ORDER — TRAMADOL HCL 50 MG PO TABS: 50.0000 mg | ORAL_TABLET | Freq: Four times a day (QID) | ORAL | 0 refills | Status: DC | PRN

## 2017-12-06 MED ORDER — PREDNISONE 10 MG PO TABS: ORAL_TABLET | ORAL | 0 refills | Status: DC

## 2017-12-06 NOTE — Assessment & Plan Note (Addendum)
SYMPTOMS NOT CONTROLLED AND MAY B EXACERBATED BY POST-INFECTIOUS IBS/GASTROPARESIS, CROHN'S FLARE BASED ON CT JAN 2019, LESS LIKELY INTESTINAL LYMPHOMA, C DIFF, GIARDIA, THYROID DISTURBANCE, ADRENAL INSUFFICIENCY, OR DIABETES.  CONTINUE IMURAN AND REMICADE. START PREDNISONE 20 MG DAILY FOR ONE WEEK THEN 10 MG DAILY FO RONE WEEK THEN 5 MG DAILY FOR ONE WEEK. TAKE CALCIUM WITH VITAMIN D THREE TIMES A DAY WITH FOOD. ADD VITAMIN B12 100 MCG DAILY. USE ULTRAM IF NEEDED FOR ABDOMINAL PAIN. USE PHENERGAN IF NEEDED TO CONTROL NAUSEA/VOMITING. COMPLETE STOOL STUDIES AND LABS. COMPLETE GASTRIC EMPTYING STUDY WITHIN 1-2 WEEKS. COMPLETE COLONOSCOPY AND UPPER ENDOSCOPY IN 2-3 WEEKS. DISCUSSED PROCEDURE, BENEFITS, & RISKS: < 1% chance of medication reaction, bleeding, perforation, or rupture of spleen/liver. PLEASE TEXT/CALL WITH QUESTION OR CONCERNS.  FOLLOW UP IN 1 MOS.

## 2017-12-06 NOTE — Assessment & Plan Note (Addendum)
SYMPTOMS NOT IDEALLY CONTROLLED.  CONTINUE IMURAN AND REMICADE. START PREDNISONE 20 MG DAILY FOR ONE WEEK THEN 10 MG DAILY FO RONE WEEK THEN 5 MG DAILY FOR ONE WEEK. TAKE CALCIUM WITH VITAMIN D THREE TIMES A DAY WITH FOOD. ADD VITAMIN B12 100 MCG DAILY. USE ULTRAM IF NEEDED FOR ABDOMINAL PAIN. USE PHENERGAN IF NEEDED TO CONTROL NAUSEA/VOMITING. COMPLETE STOOL STUDIES AND LABS. COMPLETE COLONOSCOPY AND UPPER ENDOSCOPY IN 2-3 WEEKS.   PLEASE TEXT/CALL WITH QUESTION OR CONCERNS.  FOLLOW UP IN 1 MOS.

## 2017-12-06 NOTE — Progress Notes (Signed)
Subjective:    Patient ID: Kendra Franklin, female    DOB: Dec 31, 1990, 27 y.o.   MRN: [id number]  Raylene Everts, MD  HPI  MOTHER PRESENT. PT SIGNIFICANTLY CHANGED SINCE LAST VISIT. Always hurting in the middle, especially first thing in the morning. ACHY, 5/10, EVERY DAY, COMES AND GOES,LASTS HOURS. NO RADIATION. BETTER: NO. WORSE:  NOT WITH EATING. TRIGGERS: NONE. EVER NOW IN THEN A LITTLE BLOOD. PASSED KIDNEY STONE IN SEP 2018.BMs: LAST TWO WEEKS-#6-7(BUTT PAIN 10/10), ANY OTHER TIME(#4,#2 STOOL-BUTT PAIN IF HAS MORE THAN 2 BMs). SEE SLIMY STUFF COMING OUT OF HOLE. GETTING REMICADE AND HAD IMURAN BUT LEVEL ZERO. SYMPTOMS IN PAST 2 WEEKS SIMILAR EPISODE WHEN SHE WAS SICK IN [place]. CAME BACK FROM [place](LAST SUMMER-June).  HIGHER IMURAN 100 MG TOLERATES BUT 150 MG DAILY CAUSE WATERY STOOLS/FECAL INCONTINENCE. NAUSEA: LAST 2 WEEKS EVERY DAY, VOMITS: ANYTIME SHE EATS SOMETHING-ONCE OR TWICE A DAY,ALMOST EVERY DAY. HEADACHE: MIGRAINES 3X/WEEK. 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 MILK: NONE, CHEESE: ONCE A MO. ICE CREAM: NO, PROCESSED/FAST FOOD THREE TIME S A WEEK. LAST STEROIDS LONG TIME: 2012. LAST WATERY STOOL SAT. NO ASPIRIN, BC/GOODY POWDERS, IBUPROFEN/MOTRIN, OR NAPROXEN/ALEVE. RARE ETOH ESPECIALLY BEEN SICK. NO TOBACCO PRODUCTS. TAKING HEMP OIL. THC: STARTED ONE MO AGO(3 NIGHTS A WEEK AT BEDTIME).  PT DENIES FEVER, CHILLS, HEMATURIA, HEMATEMESIS,  melena, diarrhea, CHEST PAIN, SHORTNESS OF BREATH, CHANGE IN BOWEL IN HABITS, constipation, problems swallowing, problems with sedation, OR heartburn or indigestion. NO SORES IN MOUTH, RASH ON LEGS, JOINT PAIN, OR BACK PAIN.  Past Medical History:  Diagnosis Date  . Allergy    medicine  . Bloody diarrhea 03/11/2015  . Crohn's    DX   DEC 2011--- ASCA 100 pANCA NEG-APR 2012 6-TGN 233(LLN 230)  6-MMPN 1173 ON 75 MG IMURAN  . Crohn's disease, small and large intestine ([place])   . History of anal fissures   . History of anal lesion    2009   NON--HEALING  . History of kidney stones   . History of small intestine ulcer    2011  . Osteoporosis    Past Surgical History:  Procedure Laterality Date  . COLONOSCOPY  DEC 2011   ILEO-COLONIC ULCERS  . COLONOSCOPY N/A 03/16/2015   SLF: 1/ The examined terminal ileum appeared to be normal 2. The left colon is redundatnt 3. Rectal bleeding due to small internal hemorrhoids  . CYSTOSCOPY WITH RETROGRADE PYELOGRAM, URETEROSCOPY AND STENT PLACEMENT Left 02/27/2014   Procedure: CYSTOSCOPY WITH RETROGRADE PYELOGRAM, URETEROSCOPY/ STENT PLACEMENT;  Surgeon: Alexis Frock, MD;  Location: [facility];  Service: Urology;  Laterality: Left;  . ESOPHAGOGASTRODUODENOSCOPY N/A 03/16/2015   SLF: 1. epigastric pain most likely due to gastritis/pyschosocial stressors, less likely GERD. 2. Mild non-erosive gastritis.   Marland Kitchen FLEXIBLE SIGMOIDOSCOPY N/A 12/11/2013   Procedure: FLEXIBLE SIGMOIDOSCOPY;  Surgeon: Danie Binder, MD;  [facility];  Service: Endoscopy;  Laterality: N/A;  1:45 PM  . GIVENS CAPSULE STUDY  DEC 2011   SB ULCERS  . HOLMIUM LASER APPLICATION Left 3/0/8657   Procedure: HOLMIUM LASER APPLICATION;  Surgeon: Alexis Frock, MD;  Location: [facility];  Service: Urology;  Laterality: Left;  . INCISION AND DRAINAGE PERIRECTAL ABSCESS  2009  &  2010   Allergies  Allergen Reactions  . Morphine Hives   Current Outpatient Medications  Medication Sig Dispense Refill  . alendronate (FOSAMAX) 70 MG tablet Take 1 tablet (70 mg total) by mouth every 7 (seven)  days. Take with a full glass of water on an empty stomach.    . IMURAN 50 MG tablet Take 2 tablets DAILY    . inFLIXimab (REMICADE) 100 MG injection Inject 300 mg into the vein every 8 (eight) weeks. LAST INJECTION NOV 2017    . medroxyPROGESTERone (DEPO-SUBQ PROVERA) 104 MG/0.65ML injection Inject 104 mg into the skin every 3 (three) months. LAST INJECTION FEB 2015    .      Marland Kitchen polyethylene  glycol powder (GLYCOLAX/MIRALAX) powder MIX 17 GRAMS (MARKED ON CAP) WITH LIQUID AND DRINK ONCE DAILY AS NEEDED PRN   . promethazine (PHENERGAN) 25 MG tablet Take 1/2 tablet to 1 whole tablet every 8 hours.    .      . valACYclovir (VALTREX) 1000 MG tablet Take 1,000 mg by mouth daily.     Review of Systems PER HPI OTHERWISE ALL SYSTEMS ARE NEGATIVE.    Objective:   Physical Exam  Constitutional: She is oriented to person, place, and time. She appears well-developed and well-nourished. No distress.  HENT:  Head: Normocephalic and atraumatic.  Mouth/Throat: Oropharynx is clear and moist. No oropharyngeal exudate.  Eyes: Pupils are equal, round, and reactive to light. No scleral icterus.  Neck: Normal range of motion. Neck supple.  Cardiovascular: Normal rate, regular rhythm and normal heart sounds.  Pulmonary/Chest: Effort normal and breath sounds normal. No respiratory distress.  Abdominal: Soft. Bowel sounds are normal. She exhibits no distension. There is no tenderness.  Musculoskeletal: She exhibits no edema.  Lymphadenopathy:    She has no cervical adenopathy.  Neurological: She is alert and oriented to person, place, and time.  NO FOCAL DEFICITS  Psychiatric:  NL AFFECT, SLIGHTLY DEPRESSED MOOD, TEARFUL TODAY  Vitals reviewed.     Assessment & Plan:

## 2017-12-06 NOTE — Assessment & Plan Note (Addendum)
SYMPTOMS NOT IDEALLY CONTROLLED AND ONGOING FOR 2 MO AND ASSOCIATED WITH WEIGHT LOSS.   CONTINUE IMURAN AND REMICADE. START PREDNISONE 20 MG DAILY FOR ONE WEEK THEN 10 MG DAILY FOR ONE WEEK THEN 5 MG DAILY FOR ONE WEEK. ADD VITAMIN B12 100 MCG DAILY. USE PHENERGAN IF NEEDED TO CONTROL NAUSEA/VOMITING. COMPLETE STOOL STUDIES AND LABS. COMPLETE GASTRIC EMPTYING STUDY. WITHIN 1-2 WEEKS. COMPLETE COLONOSCOPY AND UPPER ENDOSCOPY IN 2-3 WEEKS. PLEASE TEXT/CALL WITH QUESTION OR CONCERNS.  FOLLOW UP IN 1 MOS.

## 2017-12-06 NOTE — Patient Instructions (Addendum)
CONTINUE IMURAN AND REMICADE.  START PREDNISONE 20 MG DAILY FOR ONE WEEK THEN 10 MG DAILY FOR ONE WEEK THEN 5 MG DAILY FOR ONE WEEK.  TAKE CALCIUM WITH VITAMIN D THREE TIMES A DAY WITH FOOD.  ADD VITAMIN B12 100 MCG DAILY.  USE ULTRAM IF NEEDED FOR ABDOMINAL PAIN.  USE PHENERGAN IF NEEDED TO CONTROL NAUSEA/VOMITING.  COMPLETE STOOL STUDIES AND LABS.  COMPLETE GASTRIC EMPTYING STUDY WITHIN 1-2 WEEKS.  COMPLETE COLONOSCOPY AND UPPER ENDOSCOPY IN 2-3 WEEKS.  PLEASE TEXT/CALL WITH QUESTION OR CONCERNS.  FOLLOW UP IN 1 MOS.

## 2017-12-06 NOTE — H&P (View-Only) (Signed)
Subjective:    Patient ID: Kendra Franklin, female    DOB: 1991/05/05, 27 y.o.   MRN: [id number]  Raylene Everts, MD  HPI  MOTHER PRESENT. PT SIGNIFICANTLY CHANGED SINCE LAST VISIT. Always hurting in the middle, especially first thing in the morning. ACHY, 5/10, EVERY DAY, COMES AND GOES,LASTS HOURS. NO RADIATION. BETTER: NO. WORSE:  NOT WITH EATING. TRIGGERS: NONE. EVER NOW IN THEN A LITTLE BLOOD. PASSED KIDNEY STONE IN SEP 2018.BMs: LAST TWO WEEKS-#6-7(BUTT PAIN 10/10), ANY OTHER TIME(#4,#2 STOOL-BUTT PAIN IF HAS MORE THAN 2 BMs). SEE SLIMY STUFF COMING OUT OF HOLE. GETTING REMICADE AND HAD IMURAN BUT LEVEL ZERO. SYMPTOMS IN PAST 2 WEEKS SIMILAR EPISODE WHEN SHE WAS SICK IN [place]. CAME BACK FROM [place](LAST SUMMER-June).  HIGHER IMURAN 100 MG TOLERATES BUT 150 MG DAILY CAUSE WATERY STOOLS/FECAL INCONTINENCE. NAUSEA: LAST 2 WEEKS EVERY DAY, VOMITS: ANYTIME SHE EATS SOMETHING-ONCE OR TWICE A DAY,ALMOST EVERY DAY. HEADACHE: MIGRAINES 3X/WEEK. 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 MILK: NONE, CHEESE: ONCE A MO. ICE CREAM: NO, PROCESSED/FAST FOOD THREE TIME S A WEEK. LAST STEROIDS LONG TIME: 2012. LAST WATERY STOOL SAT. NO ASPIRIN, BC/GOODY POWDERS, IBUPROFEN/MOTRIN, OR NAPROXEN/ALEVE. RARE ETOH ESPECIALLY BEEN SICK. NO TOBACCO PRODUCTS. TAKING HEMP OIL. THC: STARTED ONE MO AGO(3 NIGHTS A WEEK AT BEDTIME).  PT DENIES FEVER, CHILLS, HEMATURIA, HEMATEMESIS,  melena, diarrhea, CHEST PAIN, SHORTNESS OF BREATH, CHANGE IN BOWEL IN HABITS, constipation, problems swallowing, problems with sedation, OR heartburn or indigestion. NO SORES IN MOUTH, RASH ON LEGS, JOINT PAIN, OR BACK PAIN.  Past Medical History:  Diagnosis Date  . Allergy    medicine  . Bloody diarrhea 03/11/2015  . Crohn's    DX   DEC 2011--- ASCA 100 pANCA NEG-APR 2012 6-TGN 233(LLN 230)  6-MMPN 1173 ON 75 MG IMURAN  . Crohn's disease, small and large intestine ([place])   . History of anal fissures   . History of anal lesion    2009   NON--HEALING  . History of kidney stones   . History of small intestine ulcer    2011  . Osteoporosis    Past Surgical History:  Procedure Laterality Date  . COLONOSCOPY  DEC 2011   ILEO-COLONIC ULCERS  . COLONOSCOPY N/A 03/16/2015   SLF: 1/ The examined terminal ileum appeared to be normal 2. The left colon is redundatnt 3. Rectal bleeding due to small internal hemorrhoids  . CYSTOSCOPY WITH RETROGRADE PYELOGRAM, URETEROSCOPY AND STENT PLACEMENT Left 02/27/2014   Procedure: CYSTOSCOPY WITH RETROGRADE PYELOGRAM, URETEROSCOPY/ STENT PLACEMENT;  Surgeon: Alexis Frock, MD;  Location: [facility];  Service: Urology;  Laterality: Left;  . ESOPHAGOGASTRODUODENOSCOPY N/A 03/16/2015   SLF: 1. epigastric pain most likely due to gastritis/pyschosocial stressors, less likely GERD. 2. Mild non-erosive gastritis.   Marland Kitchen FLEXIBLE SIGMOIDOSCOPY N/A 12/11/2013   Procedure: FLEXIBLE SIGMOIDOSCOPY;  Surgeon: Danie Binder, MD;  [facility];  Service: Endoscopy;  Laterality: N/A;  1:45 PM  . GIVENS CAPSULE STUDY  DEC 2011   SB ULCERS  . HOLMIUM LASER APPLICATION Left 2/0/9470   Procedure: HOLMIUM LASER APPLICATION;  Surgeon: Alexis Frock, MD;  Location: [facility];  Service: Urology;  Laterality: Left;  . INCISION AND DRAINAGE PERIRECTAL ABSCESS  2009  &  2010   Allergies  Allergen Reactions  . Morphine Hives   Current Outpatient Medications  Medication Sig Dispense Refill  . alendronate (FOSAMAX) 70 MG tablet Take 1 tablet (70 mg total) by mouth every 7 (seven)  days. Take with a full glass of water on an empty stomach.    . IMURAN 50 MG tablet Take 2 tablets DAILY    . inFLIXimab (REMICADE) 100 MG injection Inject 300 mg into the vein every 8 (eight) weeks. LAST INJECTION NOV 2017    . medroxyPROGESTERone (DEPO-SUBQ PROVERA) 104 MG/0.65ML injection Inject 104 mg into the skin every 3 (three) months. LAST INJECTION FEB 2015    .      Marland Kitchen polyethylene  glycol powder (GLYCOLAX/MIRALAX) powder MIX 17 GRAMS (MARKED ON CAP) WITH LIQUID AND DRINK ONCE DAILY AS NEEDED PRN   . promethazine (PHENERGAN) 25 MG tablet Take 1/2 tablet to 1 whole tablet every 8 hours.    .      . valACYclovir (VALTREX) 1000 MG tablet Take 1,000 mg by mouth daily.     Review of Systems PER HPI OTHERWISE ALL SYSTEMS ARE NEGATIVE.    Objective:   Physical Exam  Constitutional: She is oriented to person, place, and time. She appears well-developed and well-nourished. No distress.  HENT:  Head: Normocephalic and atraumatic.  Mouth/Throat: Oropharynx is clear and moist. No oropharyngeal exudate.  Eyes: Pupils are equal, round, and reactive to light. No scleral icterus.  Neck: Normal range of motion. Neck supple.  Cardiovascular: Normal rate, regular rhythm and normal heart sounds.  Pulmonary/Chest: Effort normal and breath sounds normal. No respiratory distress.  Abdominal: Soft. Bowel sounds are normal. She exhibits no distension. There is no tenderness.  Musculoskeletal: She exhibits no edema.  Lymphadenopathy:    She has no cervical adenopathy.  Neurological: She is alert and oriented to person, place, and time.  NO FOCAL DEFICITS  Psychiatric:  NL AFFECT, SLIGHTLY DEPRESSED MOOD, TEARFUL TODAY  Vitals reviewed.     Assessment & Plan:

## 2017-12-11 ENCOUNTER — Telehealth: Payer: Self-pay

## 2017-12-11 NOTE — Telephone Encounter (Signed)
Updated info faxed to [facility] for the Remicade.  PA not required.

## 2017-12-13 ENCOUNTER — Encounter (HOSPITAL_COMMUNITY): Payer: Self-pay

## 2017-12-13 ENCOUNTER — Ambulatory Visit (HOSPITAL_COMMUNITY)
Admission: RE | Admit: 2017-12-13 | Discharge: 2017-12-13 | Disposition: A | Discharge status: Home / Self Care | Payer: Medicare Other | Source: Ambulatory Visit | Attending: Gastroenterology | Admitting: Gastroenterology

## 2017-12-13 DIAGNOSIS — K5090 Crohn's disease, unspecified, without complications: Secondary | ICD-10-CM | POA: Diagnosis not present

## 2017-12-13 DIAGNOSIS — K50913 Crohn's disease, unspecified, with fistula: Secondary | ICD-10-CM

## 2017-12-13 MED ORDER — TECHNETIUM TC 99M SULFUR COLLOID
2.0000 | Freq: Once | INTRAVENOUS | Status: AC | PRN
  Administered 2017-12-13: 2 via ORAL

## 2017-12-14 ENCOUNTER — Telehealth: Payer: Self-pay | Admitting: Gastroenterology

## 2017-12-14 NOTE — Telephone Encounter (Signed)
Pt is aware.  

## 2017-12-14 NOTE — Telephone Encounter (Signed)
PLEASE CALL PT. HER GES IS NORMAL. USE PHENERGAN IF NEEDED TO CONTROL NAUSEA/VOMITING. COMPLETE STOOL STUDIES AND LABS. IF HER NAUSEA/VOMITING IS NOT BETTER ON THE DAY OF HER COLONOSCOPY THEN SHE MAY NEED AN EGD AS WELL.

## 2017-12-21 ENCOUNTER — Telehealth: Payer: Self-pay | Admitting: *Deleted

## 2017-12-21 NOTE — Telephone Encounter (Signed)
Caroln called from Endo and wanted to know if we could see if pt could move up procedure on 12/26/17 to 11:30am.   Spoke with pt and she is able to do. Pt is aware she will need to register at 10:30am. NPO after 8:30am, and will drink second half of prep at 6:30am. Nothing further needed

## 2017-12-25 NOTE — Telephone Encounter (Signed)
Melanie at [facility] called office. A case was added to schedule for 12/26/17. She requested for pt to arrive at 1:45pm tomorrow for her procedure. Called and informed pt. Advised her to start drinking 2nd half of prep tomorrow morning by 9:45am

## 2017-12-26 ENCOUNTER — Encounter (HOSPITAL_COMMUNITY): Payer: Self-pay | Admitting: *Deleted

## 2017-12-26 ENCOUNTER — Ambulatory Visit (HOSPITAL_COMMUNITY)
Admission: RE | Admit: 2017-12-26 | Discharge: 2017-12-26 | Disposition: A | Discharge status: Home / Self Care | Payer: Medicare Other | Source: Ambulatory Visit | Attending: Gastroenterology | Admitting: Gastroenterology

## 2017-12-26 ENCOUNTER — Encounter (HOSPITAL_COMMUNITY)
Admission: RE | Disposition: A | Discharge status: Home / Self Care | Payer: Self-pay | Source: Ambulatory Visit | Attending: Gastroenterology

## 2017-12-26 ENCOUNTER — Other Ambulatory Visit: Payer: Self-pay

## 2017-12-26 DIAGNOSIS — R109 Unspecified abdominal pain: Secondary | ICD-10-CM

## 2017-12-26 DIAGNOSIS — Z87442 Personal history of urinary calculi: Secondary | ICD-10-CM | POA: Diagnosis not present

## 2017-12-26 DIAGNOSIS — K6289 Other specified diseases of anus and rectum: Secondary | ICD-10-CM | POA: Insufficient documentation

## 2017-12-26 DIAGNOSIS — K921 Melena: Secondary | ICD-10-CM | POA: Insufficient documentation

## 2017-12-26 DIAGNOSIS — Q438 Other specified congenital malformations of intestine: Secondary | ICD-10-CM | POA: Insufficient documentation

## 2017-12-26 DIAGNOSIS — R1030 Lower abdominal pain, unspecified: Secondary | ICD-10-CM | POA: Diagnosis not present

## 2017-12-26 DIAGNOSIS — Z8711 Personal history of peptic ulcer disease: Secondary | ICD-10-CM | POA: Insufficient documentation

## 2017-12-26 DIAGNOSIS — R197 Diarrhea, unspecified: Secondary | ICD-10-CM | POA: Diagnosis not present

## 2017-12-26 DIAGNOSIS — Z793 Long term (current) use of hormonal contraceptives: Secondary | ICD-10-CM | POA: Insufficient documentation

## 2017-12-26 DIAGNOSIS — K50911 Crohn's disease, unspecified, with rectal bleeding: Secondary | ICD-10-CM | POA: Diagnosis not present

## 2017-12-26 DIAGNOSIS — K2951 Unspecified chronic gastritis with bleeding: Secondary | ICD-10-CM | POA: Diagnosis not present

## 2017-12-26 DIAGNOSIS — Z885 Allergy status to narcotic agent status: Secondary | ICD-10-CM | POA: Diagnosis not present

## 2017-12-26 DIAGNOSIS — Z79899 Other long term (current) drug therapy: Secondary | ICD-10-CM | POA: Diagnosis not present

## 2017-12-26 DIAGNOSIS — R634 Abnormal weight loss: Secondary | ICD-10-CM | POA: Diagnosis not present

## 2017-12-26 DIAGNOSIS — K2950 Unspecified chronic gastritis without bleeding: Secondary | ICD-10-CM | POA: Diagnosis not present

## 2017-12-26 DIAGNOSIS — K2970 Gastritis, unspecified, without bleeding: Secondary | ICD-10-CM | POA: Diagnosis not present

## 2017-12-26 DIAGNOSIS — Z9889 Other specified postprocedural states: Secondary | ICD-10-CM | POA: Diagnosis not present

## 2017-12-26 HISTORY — PX: ESOPHAGOGASTRODUODENOSCOPY: SHX5428

## 2017-12-26 HISTORY — PX: COLONOSCOPY: SHX5424

## 2017-12-26 SURGERY — COLONOSCOPY
Anesthesia: Moderate Sedation

## 2017-12-26 MED ORDER — MIDAZOLAM HCL 5 MG/5ML IJ SOLN
INTRAMUSCULAR | Status: DC | PRN
  Administered 2017-12-26 (×2): 2 mg via INTRAVENOUS
  Administered 2017-12-26 (×2): 1 mg via INTRAVENOUS

## 2017-12-26 MED ORDER — LIDOCAINE VISCOUS 2 % MT SOLN
OROMUCOSAL | Status: AC
  Filled 2017-12-26: qty 15

## 2017-12-26 MED ORDER — SODIUM CHLORIDE 0.9% FLUSH
INTRAVENOUS | Status: AC
  Filled 2017-12-26: qty 10

## 2017-12-26 MED ORDER — MEPERIDINE HCL 100 MG/ML IJ SOLN
INTRAMUSCULAR | Status: DC | PRN
  Administered 2017-12-26 (×2): 25 mg
  Administered 2017-12-26: 50 mg

## 2017-12-26 MED ORDER — PROMETHAZINE HCL 25 MG/ML IJ SOLN
INTRAMUSCULAR | Status: DC | PRN
  Administered 2017-12-26: 12.5 mg via INTRAVENOUS

## 2017-12-26 MED ORDER — PROMETHAZINE HCL 25 MG/ML IJ SOLN
INTRAMUSCULAR | Status: AC
  Filled 2017-12-26: qty 1

## 2017-12-26 MED ORDER — LIDOCAINE VISCOUS 2 % MT SOLN
OROMUCOSAL | Status: DC | PRN
  Administered 2017-12-26: 1 via OROMUCOSAL

## 2017-12-26 MED ORDER — MIDAZOLAM HCL 5 MG/5ML IJ SOLN
INTRAMUSCULAR | Status: AC
  Filled 2017-12-26: qty 10

## 2017-12-26 MED ORDER — SODIUM CHLORIDE 0.9 % IV SOLN
INTRAVENOUS | Status: DC
  Administered 2017-12-26: 14:00:00 via INTRAVENOUS

## 2017-12-26 MED ORDER — MEPERIDINE HCL 100 MG/ML IJ SOLN
INTRAMUSCULAR | Status: AC
  Filled 2017-12-26: qty 2

## 2017-12-26 NOTE — Discharge Instructions (Signed)
You have MILDLY active small bowel CROHN'S DISEASE IN YOUR RECTUM  YOU HAVE GASTRITIS. YOUR SMALL BOWEL AND COLON LOOKS NORMAL.  I BIOPSIED YOUR STOMACH, SMALL BOWEL, COLON, AND RECTUM.    IF YOU ARE HAVING DIARRHEA, AVOID DAIRY. SEE INFO BELOW.  CONTINUE REMICADE AND IMURAN.  USE IMODIUM IF NEEDED TO CONTROL DIARRHEA.   USE PHENERGAN IF NEEDED FOR NAUSEA AND VOMITING.   YOUR BIOPSY RESULTS WILL BE AVAILABLE IN MY CHART AFTER FEB 4 AND MY OFFICE WILL CONTACT YOU IN 10-14 DAYS WITH YOUR RESULTS.    FOLLOW UP IN FEB 2019 WITH DR. Deazia Lampi.  Colonoscopy Care After Read the instructions outlined below and refer to this sheet in the next week. These discharge instructions provide you with general information on caring for yourself after you leave the hospital. While your treatment has been planned according to the most current medical practices available, unavoidable complications occasionally occur. If you have any problems or questions after discharge, call DR. Earnie Bechard, [phone].  ACTIVITY  You may resume your regular activity, but move at a slower pace for the next 24 hours.   Take frequent rest periods for the next 24 hours.   Walking will help get rid of the air and reduce the bloated feeling in your belly (abdomen).   No driving for 24 hours (because of the medicine (anesthesia) used during the test).   You may shower.   Do not sign any important legal documents or operate any machinery for 24 hours (because of the anesthesia used during the test).    NUTRITION  Drink plenty of fluids.   You may resume your normal diet as instructed by your doctor.   Begin with a light meal and progress to your normal diet. Heavy or fried foods are harder to digest and may make you feel sick to your stomach (nauseated).   Avoid alcoholic beverages for 24 hours or as instructed.    MEDICATIONS  You may resume your normal medications.   WHAT YOU CAN EXPECT TODAY  Some feelings of  bloating in the abdomen.   Passage of more gas than usual.   Spotting of blood in your stool or on the toilet paper  .  IF YOU HAD POLYPS REMOVED DURING THE COLONOSCOPY:  No aspirin products for 7 days or as instructed.   Eat a soft diet IF YOU HAVE NAUSEA, BLOATING, ABDOMINAL PAIN, OR VOMITING.    FINDING OUT THE RESULTS OF YOUR TEST Not all test results are available during your visit. [name] WILL CALL YOU WITHIN 14 DAYS OF YOUR PROCEDUE WITH YOUR RESULTS. Do not assume everything is normal if you have not heard from DR. Jung Yurchak, CALL HER OFFICE AT [phone].  SEEK IMMEDIATE MEDICAL ATTENTION AND CALL THE OFFICE: [phone] IF:  You have more than a spotting of blood in your stool.   Your belly is swollen (abdominal distention).   You are nauseated or vomiting.   You have a temperature over 101F.   You have abdominal pain or discomfort that is severe or gets worse throughout the day.     Lactose Free Diet Lactose is a carbohydrate that is found mainly in milk and milk products, as well as in foods with added milk or whey. Lactose must be digested by the enzyme in order to be used by the body. Lactose intolerance occurs when there is a shortage of lactase. When your body is not able to digest lactose, you may feel sick to your stomach (  nausea), bloating, cramping, gas and diarrhea.  There are many dairy products that may be tolerated better than milk by some people:  The use of cultured dairy products such as yogurt, buttermilk, cottage cheese, and sweet acidophilus milk (Kefir) for lactase-deficient individuals is usually well tolerated. This is because the healthy bacteria help digest lactose.   Lactose-hydrolyzed milk (Lactaid) contains 40-90% less lactose than milk and may also be well tolerated.    SPECIAL NOTES  Lactose is a carbohydrates. The major food source is dairy products. Reading food labels is important. Many products contain lactose even when they  are not made from milk. Look for the following words: whey, milk solids, dry milk solids, nonfat dry milk powder. Typical sources of lactose other than dairy products include breads, candies, cold cuts, prepared and processed foods, and commercial sauces and gravies.   All foods must be prepared without milk, cream, or other dairy foods.   Soy milk and lactose-free supplements (LACTASE) may be used as an alternative to milk.   FOOD GROUP ALLOWED/RECOMMENDED AVOID/USE SPARINGLY  BREADS / STARCHES 4 servings or more* Breads and rolls made without milk. [place], [place], or [place] bread. Breads and rolls that contain milk. Prepared mixes such as muffins, biscuits, waffles, pancakes. Sweet rolls, donuts, [place] toast (if made with milk or lactose).  Crackers: Soda crackers, graham crackers. Any crackers prepared without lactose. Zwieback crackers, corn curls, or any that contain lactose.  Cereals: Cooked or dry cereals prepared without lactose (read labels). Cooked or dry cereals prepared with lactose (read labels). Total, Cocoa Krispies. Special K.  Potatoes / Pasta / Rice: Any prepared without milk or lactose. Popcorn. Instant potatoes, frozen [place] fries, scalloped or au gratin potatoes.  VEGETABLES 2 servings or more Fresh, frozen, and canned vegetables. Creamed or breaded vegetables. Vegetables in a cheese sauce or with lactose-containing margarines.  FRUIT 2 servings or more All fresh, canned, or frozen fruits that are not processed with lactose. Any canned or frozen fruits processed with lactose.  MEAT & SUBSTITUTES 2 servings or more (4 to 6 oz. total per day) Plain beef, chicken, fish, [place], lamb, veal, pork, or ham. Kosher prepared meat products. Strained or junior meats that do not contain milk. Eggs, soy meat substitutes, nuts. Scrambled eggs, omelets, and souffles that contain milk. Creamed or breaded meat, fish, or fowl. Sausage products such as wieners, liver sausage, or cold cuts  that contain milk solids. Cheese, cottage cheese, or cheese spreads.  MILK None. (See BEVERAGES for milk substitutes. See DESSERTS for ice cream and frozen desserts.) Milk (whole, 2%, skim, or chocolate). Evaporated, powdered, or condensed milk; malted milk.  SOUPS & COMBINATION FOODS Bouillon, broth, vegetable soups, clear soups, consomms. Homemade soups made with allowed ingredients. Combination or prepared foods that do not contain milk or milk products (read labels). Cream soups, chowders, commercially prepared soups containing lactose. Macaroni and cheese, pizza. Combination or prepared foods that contain milk or milk products.  DESSERTS & SWEETS In moderation Water and fruit ices; gelatin; angel food cake. Homemade cookies, pies, or cakes made from allowed ingredients. Pudding (if made with water or a milk substitute). Lactose-free tofu desserts. Sugar, honey, corn syrup, jam, jelly; marmalade; molasses (beet sugar); Pure sugar candy; marshmallows. Ice cream, ice milk, sherbet, custard, pudding, frozen yogurt. Commercial cake and cookie mixes. Desserts that contain chocolate. Pie crust made with milk-containing margarine; reduced-calorie desserts made with a sugar substitute that contains lactose. Toffee, peppermint, butterscotch, chocolate, caramels.  FATS & OILS In moderation  Butter (as tolerated; contains very small amounts of lactose). Margarines and dressings that do not contain milk, Vegetable oils, shortening, Miracle Whip, mayonnaise, nondairy cream & whipped toppings without lactose or milk solids added (examples: Coffee Rich, Carnation Coffeemate, Rich's Whipped Topping, PolyRich). Berniece Salines. Margarines and salad dressings containing milk; cream, cream cheese; peanut butter with added milk solids, sour cream, chip dips, made with sour cream.  BEVERAGES Carbonated drinks; tea; coffee and freeze-dried coffee; some instant coffees (check labels). Fruit drinks; fruit and vegetable juice; Rice  or Soy milk. Ovaltine, hot chocolate. Some cocoas; some instant coffees; instant iced teas; powdered fruit drinks (read labels).   CONDIMENTS / MISCELLANEOUS Soy sauce, carob powder, olives, gravy made with water, baker's cocoa, pickles, pure seasonings and spices, wine, pure monosodium glutamate, catsup, mustard. Some chewing gums, chocolate, some cocoas. Certain antibiotics and vitamin / mineral preparations. Spice blends if they contain milk products. MSG extender. Artificial sweeteners that contain lactose such as Equal (Nutra-Sweet) and Sweet 'n Low. Some nondairy creamers (read labels).   SAMPLE MENU*  Breakfast   Orange Juice.  Banana.   Bran flakes.   Nondairy Creamer.  [place] Bread (toasted).   Butter or milk-free margarine.   Coffee or tea.    Noon Meal   Chicken Breast.  Rice.   Green beans.   Butter or milk-free margarine.  Fresh melon.   Coffee or tea.    Evening Meal   Roast Beef.  Baked potato.   Butter or milk-free margarine.   Broccoli.   Lettuce salad with vinegar and oil dressing.  [employer]   Coffee or tea.

## 2017-12-26 NOTE — Op Note (Signed)
[facility] Patient Name: Kendra Franklin Procedure Date: 12/26/2017 2:46 PM MRN: [id number] Date of Birth: [birth year] Attending MD: Barney Drain MD, MD CSN: [id number] Age: 27 Admit Type: Outpatient Procedure:                Upper GI endoscopy with COLD FORCEPS BIOPSY Indications:              Diarrhea, NAUSEA/VOMITING. FINISHED PREDNISONE JAN                            28. Providers:                Barney Drain MD, MD, Janeece Riggers, RN, Nelma Rothman,                            Technician Referring MD:             Lysle Morales Medicines:                TCS + Meperidine 25 mg IV, Midazolam 1 mg IV Complications:            No immediate complications. Estimated Blood Loss:     Estimated blood loss was minimal. Procedure:                Pre-Anesthesia Assessment:                           - Prior to the procedure, a History and Physical                            was performed, and patient medications and                            allergies were reviewed. The patient's tolerance of                            previous anesthesia was also reviewed. The risks                            and benefits of the procedure and the sedation                            options and risks were discussed with the patient.                            All questions were answered, and informed consent                            was obtained. Prior Anticoagulants: The patient has                            taken no previous anticoagulant or antiplatelet                            agents. ASA Grade Assessment: II - A patient with  mild systemic disease. After reviewing the risks                            and benefits, the patient was deemed in                            satisfactory condition to undergo the procedure.                            After obtaining informed consent, the endoscope was                            passed under direct vision. Throughout the             procedure, the patient's blood pressure, pulse, and                            oxygen saturations were monitored continuously. The                            [id number]) was introduced through the mouth,                            and advanced to the second part of duodenum. The                            upper GI endoscopy was somewhat difficult due to                            the patient's agitation. Successful completion of                            the procedure was aided by increasing the dose of                            sedation medication and receiving assistance from                            additional staff. The patient tolerated the                            procedure fairly well. Scope In: 2:49:50 PM Scope Out: 3:01:43 PM Total Procedure Duration: 0 hours 11 minutes 53 seconds  Findings:      The examined esophagus was normal.      Patchy mild inflammation characterized by congestion (edema) and       erythema was found in the gastric antrum. Biopsies were taken with a       cold forceps for histology.      The examined duodenum was normal. Biopsies were taken with a cold       forceps for histology. Impression:               - MILD Gastritis.                           -  NO obvious source for abdominal pain,                            NAUSEA/VOMITING IDENTIFIED.                           - BLOODY DIARRHEA MOST LIKELY DUE TO MILD PROCTITIS Moderate Sedation:      Moderate (conscious) sedation was administered by the endoscopy nurse       and supervised by the endoscopist. The following parameters were       monitored: oxygen saturation, heart rate, blood pressure, and response       to care. Total physician intraservice time was 50 minutes. Recommendation:           - Resume previous diet.                           - Await pathology results.                           - Continue present medications.                           - Patient has a contact  number available for                            emergencies. The signs and symptoms of potential                            delayed complications were discussed with the                            patient. Return to normal activities tomorrow.                            Written discharge instructions were provided to the                            patient. Procedure Code(s):        --- Professional ---                           [phone], Esophagogastroduodenoscopy, flexible,                            transoral; with biopsy, single or multiple                           99152, Moderate sedation services provided by the                            same physician or other qualified health care                            professional performing the diagnostic or  therapeutic service that the sedation supports,                            requiring the presence of an independent trained                            observer to assist in the monitoring of the                            patient's level of consciousness and physiological                            status; initial 15 minutes of intraservice time,                            patient age 84 years or older                           [phone], Moderate sedation services; each additional                            15 minutes intraservice time                           99153, Moderate sedation services; each additional                            15 minutes intraservice time Diagnosis Code(s):        --- Professional ---                           K29.70, Gastritis, unspecified, without bleeding                           R19.7, Diarrhea, unspecified CPT copyright 2016 American Medical Association. All rights reserved. The codes documented in this report are preliminary and upon coder review may  be revised to meet current compliance requirements. Barney Drain, MD Barney Drain MD, MD 12/26/2017 3:28:54 PM This report has  been signed electronically. Number of Addenda: 0

## 2017-12-26 NOTE — Op Note (Signed)
[facility] Patient Name: Kendra Franklin Procedure Date: 12/26/2017 12:13 PM MRN: [id number] Date of Birth: 08-Aug-1991 Attending MD: Barney Drain MD, MD CSN: [id number] Age: 27 Admit Type: Outpatient Procedure:                Colonoscopy WITH COLD FORCEPS BIOPSY Indications:              Lower abdominal pain, Hematochezia, Diarrhea.                            COMPLETED PREDNISONE JAN 28. Providers:                Barney Drain MD, MD, Janeece Riggers, RN, Nelma Rothman,                            Technician Referring MD:             Lysle Morales Medicines:                Promethazine 12.5 mg IV, Meperidine 75 mg IV,                            Midazolam 5 mg IV Complications:            No immediate complications. Estimated Blood Loss:     Estimated blood loss was minimal. Procedure:                Pre-Anesthesia Assessment:                           - Prior to the procedure, a History and Physical                            was performed, and patient medications and                            allergies were reviewed. The patient's tolerance of                            previous anesthesia was also reviewed. The risks                            and benefits of the procedure and the sedation                            options and risks were discussed with the patient.                            All questions were answered, and informed consent                            was obtained. Prior Anticoagulants: The patient has                            taken no previous anticoagulant or antiplatelet  agents. ASA Grade Assessment: II - A patient with                            mild systemic disease. After reviewing the risks                            and benefits, the patient was deemed in                            satisfactory condition to undergo the procedure.                            After obtaining informed consent, the colonoscope   was passed under direct vision. Throughout the                            procedure, the patient's blood pressure, pulse, and                            oxygen saturations were monitored continuously. The                            [id number] ([id number]) scope was introduced through                            the anus and advanced to the 15 cm into the ileum.                            The colonoscopy was technically difficult and                            complex due to a tortuous colon and the patient's                            agitation. Successful completion of the procedure                            was aided by increasing the dose of sedation                            medication, changing the patient to a supine                            position, straightening and shortening the scope to                            obtain bowel loop reduction, applying abdominal                            pressure and COLOWRAP. The patient tolerated the                            procedure well. The quality of the bowel  preparation was excellent. The terminal ileum,                            ileocecal valve, appendiceal orifice, and rectum                            were photographed. Scope In: 2:23:33 PM Scope Out: 2:42:29 PM Scope Withdrawal Time: 0 hours 12 minutes 26 seconds  Total Procedure Duration: 0 hours 18 minutes 56 seconds  Findings:      The terminal ileum appeared normal.      The recto-sigmoid colon and sigmoid colon revealed significantly       excessive looping. Biopsies for histology were taken with a cold forceps       from the cecum, ascending colon, transverse colon, descending colon and       sigmoid colon for evaluation of microscopic colitis.      Diffuse mild inflammation characterized by congestion (edema) and       erythema was found in the rectum. Biopsies were taken with a cold       forceps for histology.      The perianal and digital  rectal examinations were normal. Pertinent       negatives include normal sphincter tone, no palpable rectal lesions and       no anal lesion or abnormality was detected. Impression:               - The examined portion of the ileum was normal.                           - There was significant looping of the colon.                           - Diffuse mild inflammation was found in the rectum                            consistent with Crohn's disease. Biopsied. Moderate Sedation:      Moderate (conscious) sedation was administered by the endoscopy nurse       and supervised by the endoscopist. The following parameters were       monitored: oxygen saturation, heart rate, blood pressure, and response       to care. Total physician intraservice time was 50 minutes. Recommendation:           - Resume previous diet.                           - Continue present medications. CONTINUE REMICADE                            AND IMURAN.                           - Await pathology results. CONSIDER GIVENS CAPSULE                            ENDOSCOPY TO ASSESS JEJUNUM.                           -  Repeat colonoscopy date to be determined after                            pending pathology results are reviewed for                            surveillance.                           - Patient has a contact number available for                            emergencies. The signs and symptoms of potential                            delayed complications were discussed with the                            patient. Return to normal activities tomorrow.                            Written discharge instructions were provided to the                            patient. Procedure Code(s):        --- Professional ---                           [phone], Colonoscopy, flexible; with biopsy, single                            or multiple                           99152, Moderate sedation services provided by the                             same physician or other qualified health care                            professional performing the diagnostic or                            therapeutic service that the sedation supports,                            requiring the presence of an independent trained                            observer to assist in the monitoring of the                            patient's level of consciousness and physiological                            status;  initial 15 minutes of intraservice time,                            patient age 26 years or older                           [phone], Moderate sedation services; each additional                            15 minutes intraservice time                           [phone], Moderate sedation services; each additional                            15 minutes intraservice time Diagnosis Code(s):        --- Professional ---                           K62.89, Other specified diseases of anus and rectum                           R10.30, Lower abdominal pain, unspecified                           K92.1, Melena (includes Hematochezia)                           R19.7, Diarrhea, unspecified CPT copyright 2016 American Medical Association. All rights reserved. The codes documented in this report are preliminary and upon coder review may  be revised to meet current compliance requirements. Barney Drain, MD Barney Drain MD, MD 12/26/2017 3:16:31 PM This report has been signed electronically. Number of Addenda: 0

## 2017-12-26 NOTE — Interval H&P Note (Signed)
History and Physical Interval Note:  12/26/2017 2:08 PM  Kendra Franklin  has presented today for surgery, with the diagnosis of abdominal pain, weight loss, bloody diarrhea  The various methods of treatment have been discussed with the patient and family. After consideration of risks, benefits and other options for treatment, the patient has consented to  Procedure(s) with comments: COLONOSCOPY (N/A) - 2:15pm ESOPHAGOGASTRODUODENOSCOPY (EGD) (N/A) as a surgical intervention .  The patient's history has been reviewed, patient examined, no change in status, stable for surgery.  I have reviewed the patient's chart and labs.  Questions were answered to the patient's satisfaction.     [employer]

## 2017-12-27 ENCOUNTER — Encounter (HOSPITAL_COMMUNITY)
Admission: RE | Admit: 2017-12-27 | Discharge: 2017-12-27 | Disposition: A | Discharge status: Home / Self Care | Payer: Medicare Other | Source: Ambulatory Visit | Attending: Gastroenterology | Admitting: Gastroenterology

## 2017-12-27 DIAGNOSIS — K50913 Crohn's disease, unspecified, with fistula: Secondary | ICD-10-CM | POA: Insufficient documentation

## 2017-12-27 MED ORDER — ACETAMINOPHEN 325 MG PO TABS
650.0000 mg | ORAL_TABLET | Freq: Once | ORAL | Status: DC
  Filled 2017-12-27: qty 2

## 2017-12-27 MED ORDER — HYDROCODONE-ACETAMINOPHEN 5-325 MG PO TABS
ORAL_TABLET | ORAL | Status: AC
  Filled 2017-12-27: qty 1

## 2017-12-27 MED ORDER — LORATADINE 10 MG PO TABS
10.0000 mg | ORAL_TABLET | Freq: Once | ORAL | Status: AC
  Administered 2017-12-27: 10 mg via ORAL
  Filled 2017-12-27: qty 1

## 2017-12-27 MED ORDER — SODIUM CHLORIDE 0.9 % IV SOLN
Freq: Once | INTRAVENOUS | Status: AC
  Administered 2017-12-27: 250 mL via INTRAVENOUS

## 2017-12-27 MED ORDER — HYDROCODONE-ACETAMINOPHEN 5-325 MG PO TABS
1.0000 | ORAL_TABLET | Freq: Once | ORAL | Status: AC
  Administered 2017-12-27: 1 via ORAL

## 2017-12-27 MED ORDER — INFLIXIMAB 100 MG IV SOLR
300.0000 mg | Freq: Once | INTRAVENOUS | Status: AC
  Administered 2017-12-27: 300 mg via INTRAVENOUS
  Filled 2017-12-27: qty 30

## 2017-12-27 NOTE — Progress Notes (Signed)
C/O abd pain. Rates pain 6. Dr [name] notified. Order given for norco.

## 2017-12-27 NOTE — Progress Notes (Signed)
Arrived for remicade infusion upset and crying. Reassurance given. Emotional support given. Pt states "I was just here yesterday. I don't want to be here. I am just tired." pt called and talked to mother. Emotional support given from mother. Pt states I feel better now. Will proceed with remicade infusion.

## 2017-12-27 NOTE — Progress Notes (Signed)
Resting quietly.  Warm blankets and pillow provided. No rash or hives present. resp adequate/nonlabored. Voices no c/o at this time.

## 2017-12-27 NOTE — Progress Notes (Signed)
Awake. States she had not slept well and that the norco had not helped her abd pain "very much." No rash or hives present. D/C to home in good condition.

## 2017-12-28 ENCOUNTER — Other Ambulatory Visit: Payer: Self-pay

## 2017-12-28 MED ORDER — AZATHIOPRINE 50 MG PO TABS: 100.0000 mg | ORAL_TABLET | Freq: Every day | ORAL | 5 refills | Status: DC

## 2017-12-28 NOTE — Progress Notes (Signed)
Pt is aware. Ok to schedule [facility].

## 2017-12-29 ENCOUNTER — Encounter (HOSPITAL_COMMUNITY): Payer: Self-pay | Admitting: Gastroenterology

## 2017-12-31 ENCOUNTER — Other Ambulatory Visit: Payer: Self-pay | Admitting: Gastroenterology

## 2018-01-01 ENCOUNTER — Other Ambulatory Visit: Payer: Self-pay

## 2018-01-01 ENCOUNTER — Telehealth: Payer: Self-pay

## 2018-01-01 DIAGNOSIS — R109 Unspecified abdominal pain: Secondary | ICD-10-CM

## 2018-01-01 DIAGNOSIS — K529 Noninfective gastroenteritis and colitis, unspecified: Secondary | ICD-10-CM

## 2018-01-01 NOTE — Telephone Encounter (Signed)
Rx signed to fax back

## 2018-01-01 NOTE — Telephone Encounter (Signed)
Received a fa refill request from [facility] [place], [place] for a 90 day supply for Azathioprine 54m, take 2.5 tabs po daily.   Rx was sent on 12/28/17 for #60 tabs

## 2018-01-08 ENCOUNTER — Encounter (HOSPITAL_COMMUNITY)
Admission: RE | Disposition: A | Discharge status: Home / Self Care | Payer: Self-pay | Source: Ambulatory Visit | Attending: Gastroenterology

## 2018-01-08 ENCOUNTER — Ambulatory Visit (HOSPITAL_COMMUNITY)
Admission: RE | Admit: 2018-01-08 | Discharge: 2018-01-08 | Disposition: A | Discharge status: Home / Self Care | Payer: Medicare Other | Source: Ambulatory Visit | Attending: Gastroenterology | Admitting: Gastroenterology

## 2018-01-08 ENCOUNTER — Other Ambulatory Visit: Payer: Self-pay

## 2018-01-08 ENCOUNTER — Encounter: Payer: Self-pay | Admitting: Family Medicine

## 2018-01-08 ENCOUNTER — Ambulatory Visit (INDEPENDENT_AMBULATORY_CARE_PROVIDER_SITE_OTHER): Payer: Medicare Other | Admitting: Family Medicine

## 2018-01-08 VITALS — BP 110/78 | HR 92 | Temp 98.1°F | Resp 18 | Ht 64.0 in | Wt 131.0 lb

## 2018-01-08 DIAGNOSIS — K5010 Crohn's disease of large intestine without complications: Secondary | ICD-10-CM | POA: Insufficient documentation

## 2018-01-08 DIAGNOSIS — K2970 Gastritis, unspecified, without bleeding: Secondary | ICD-10-CM | POA: Insufficient documentation

## 2018-01-08 DIAGNOSIS — K50913 Crohn's disease, unspecified, with fistula: Secondary | ICD-10-CM

## 2018-01-08 DIAGNOSIS — K529 Noninfective gastroenteritis and colitis, unspecified: Secondary | ICD-10-CM | POA: Diagnosis present

## 2018-01-08 DIAGNOSIS — Z114 Encounter for screening for human immunodeficiency virus [HIV]: Secondary | ICD-10-CM | POA: Diagnosis not present

## 2018-01-08 DIAGNOSIS — K5080 Crohn's disease of both small and large intestine without complications: Secondary | ICD-10-CM

## 2018-01-08 HISTORY — PX: GIVENS CAPSULE STUDY: SHX5432

## 2018-01-08 SURGERY — IMAGING PROCEDURE, GI TRACT, INTRALUMINAL, VIA CAPSULE

## 2018-01-08 NOTE — Progress Notes (Signed)
Chief Complaint  Patient presents with  . Follow-up   Here for routine follow-up. History of Crohn's disease. Under the care of Dr. Trinda Pascal in gastroenterology.  Recently had an EGD and colonoscopy.  She is having a capsule study today to investigate her small bowel.  She still has some digestive problems and diarrhea, abdominal pain.  Food intolerance. She still doing well in school. She is back on her alendronate.  She is taking her vitamin D.  She has tried to increase the calcium in her diet.  She has not yet increase the weightbearing exercise that was recommended for her.  Patient Active Problem List   Diagnosis Date Noted  . Loss of weight   . Nausea with vomiting 12/06/2017  . 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 Diarrhea 09/04/2017  . 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 AP (abdominal pain)   . Bloody diarrhea 03/11/2015  . Crohn's disease of intestine with fistula ([place]) 12/23/2010  . 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 alendronate (FOSAMAX) 70 MG tablet Take 1 tablet (70 mg total) by mouth every 7 (seven) days. Take with a full glass of water on an empty stomach.  Marland Kitchen azaTHIOprine (IMURAN) 50 MG tablet Take 2 tablets (100 mg total) by mouth daily.  Marland Kitchen inFLIXimab (REMICADE) 100 MG injection Inject 300 mg into the vein every 8 (eight) weeks. LAST INJECTION NOV 2017  . medroxyPROGESTERone (DEPO-SUBQ PROVERA) 104 MG/0.65ML injection Inject 104 mg into the skin every 3 (three) months. LAST INJECTION FEB 2015  . ondansetron (ZOFRAN) 4 MG tablet Take 1 tablet (4 mg total) by mouth every 8 (eight) hours as needed for nausea or vomiting.  . polyethylene glycol powder (GLYCOLAX/MIRALAX) powder MIX 17 GRAMS (MARKED ON CAP) WITH LIQUID AND DRINK ONCE DAILY AS NEEDED (Patient taking differently: MIX 17 GRAMS (MARKED ON CAP) WITH LIQUID AND DRINK ONCE DAILY AS NEEDED FOR CONTSTIPATION)  . predniSONE  (DELTASONE) 10 MG tablet 2 PO QD FOR 7 DAYS THEN  1 PO QD FOR 14 DAYS, THEN 1/2 PO DAILY FOR 7 DAYS  . promethazine (PHENERGAN) 25 MG tablet Take 1/2 tablet to 1 whole tablet every 8 hours.  . traMADol (ULTRAM) 50 MG tablet Take 1 tablet (50 mg total) by mouth every 6 (six) hours as needed.  . valACYclovir (VALTREX) 1000 MG tablet Take 1,000 mg by mouth daily.   No facility-administered encounter medications on file as of 01/08/2018.     Allergies  Allergen Reactions  . Morphine Hives    Review of Systems  Constitutional: Negative for activity change, appetite change and unexpected weight change.  HENT: Negative for congestion, dental problem, postnasal drip and rhinorrhea.   Eyes: Negative for redness and visual disturbance.  Respiratory: Negative for cough and shortness of breath.   Cardiovascular: Negative for chest pain, palpitations and leg swelling.  Gastrointestinal: Positive for abdominal distention, abdominal pain, diarrhea and nausea. Negative for constipation.  Genitourinary: Negative for difficulty urinating and frequency.  Musculoskeletal: Negative for arthralgias and back pain.  Neurological: Negative for dizziness and headaches.  Psychiatric/Behavioral: Negative for dysphoric mood and sleep disturbance. The patient is not nervous/anxious.     BP 110/78 (BP Location: Left Arm, Patient Position: Sitting, Cuff Size: Normal)   Pulse 92   Temp 98.1 F (36.7 C) (Temporal)   Resp 18   Ht 5' 4"  (1.626 m)   Wt 131 lb 0.6 oz (59.4 kg)  SpO2 97%   BMI 22.49 kg/m   Physical Exam  Constitutional: She is oriented to person, place, and time. She appears well-developed and well-nourished.  No distress.  HENT:  Head: Normocephalic and atraumatic.  Mouth/Throat: Oropharynx is clear and moist.  Eyes: Conjunctivae are normal. Pupils are equal, round, and reactive to light.  Neck: Normal range of motion. Neck supple.  Cardiovascular: Normal rate, regular rhythm and normal heart  sounds.  Pulmonary/Chest: Effort normal and breath sounds normal. No respiratory distress.  Abdominal: Soft. Bowel sounds are normal. There is no tenderness.  Exam limited by band around abdomen) capsule study)  Musculoskeletal: Normal range of motion. She exhibits no edema.  Neurological: She is alert and oriented to person, place, and time.  Gait normal  Skin: Skin is warm and dry.  Psychiatric: She has a normal mood and affect. Her behavior is normal. Thought content normal.  Nursing note and vitals reviewed.   ASSESSMENT/PLAN:  1. Crohn's disease of intestine with fistula ([place]) Under care [name].  Has seen Dr. Dorris Fetch for her osteopenia.  She is compliant with calcium and vitamin D.  Working on exercise.  She is now taking alendronate.  2. Screening for HIV (human immunodeficiency virus) Patient request - HIV antibody 3.  Discussed recommendations for immunizations given her Crohn's disease.  She refuses a flu shot.  She states her last flu shot made her sick for a week.  Patient Instructions  You are doing well Continue under care of Dr [name] eat well and exercise  See me in six months   Raylene Everts, MD

## 2018-01-08 NOTE — Patient Instructions (Signed)
You are doing well Continue under care of Dr [name] eat well and exercise  See me in six months

## 2018-01-09 ENCOUNTER — Encounter (HOSPITAL_COMMUNITY): Payer: Self-pay | Admitting: Gastroenterology

## 2018-01-10 DIAGNOSIS — T384X5A Adverse effect of oral contraceptives, initial encounter: Secondary | ICD-10-CM | POA: Diagnosis not present

## 2018-01-10 DIAGNOSIS — T380X5A Adverse effect of glucocorticoids and synthetic analogues, initial encounter: Secondary | ICD-10-CM | POA: Diagnosis not present

## 2018-01-10 DIAGNOSIS — K50913 Crohn's disease, unspecified, with fistula: Secondary | ICD-10-CM | POA: Diagnosis not present

## 2018-01-10 DIAGNOSIS — B001 Herpesviral vesicular dermatitis: Secondary | ICD-10-CM | POA: Diagnosis not present

## 2018-01-10 DIAGNOSIS — Z124 Encounter for screening for malignant neoplasm of cervix: Secondary | ICD-10-CM | POA: Diagnosis not present

## 2018-01-10 DIAGNOSIS — M818 Other osteoporosis without current pathological fracture: Secondary | ICD-10-CM | POA: Diagnosis not present

## 2018-01-10 DIAGNOSIS — Z1289 Encounter for screening for malignant neoplasm of other sites: Secondary | ICD-10-CM | POA: Diagnosis not present

## 2018-01-10 DIAGNOSIS — Z01411 Encounter for gynecological examination (general) (routine) with abnormal findings: Secondary | ICD-10-CM | POA: Diagnosis not present

## 2018-01-15 ENCOUNTER — Telehealth: Payer: Self-pay | Admitting: Gastroenterology

## 2018-01-15 NOTE — Telephone Encounter (Signed)
[phone] PATIENT CALLED Monday AND ASKED THAT YOU CALL HER Tuesday WHEN YOU ARE BACK IN THE OFFICE

## 2018-01-16 NOTE — Telephone Encounter (Signed)
PLEASE CALL PT. THE RESULTS WI;LL BE AVAILABLE TOMORROW MORNING.

## 2018-01-16 NOTE — Telephone Encounter (Signed)
Pt is aware and said to tell [name] that she does not have an appetite and she still feels nauseated.

## 2018-01-16 NOTE — Telephone Encounter (Signed)
Pt is calling for her Givens capsule results.

## 2018-01-17 ENCOUNTER — Other Ambulatory Visit: Payer: Self-pay

## 2018-01-17 DIAGNOSIS — R634 Abnormal weight loss: Secondary | ICD-10-CM

## 2018-01-17 DIAGNOSIS — R1115 Cyclical vomiting syndrome unrelated to migraine: Secondary | ICD-10-CM

## 2018-01-17 MED ORDER — PROMETHAZINE HCL 25 MG PO TABS: ORAL_TABLET | ORAL | 1 refills | Status: DC

## 2018-01-17 NOTE — Telephone Encounter (Signed)
PT is aware and has not seen the capsule pass. OK to schedule the KUB.

## 2018-01-17 NOTE — Procedures (Signed)
INDICATION: ABDOMINAL PAIN, NAUSEA, JEJUNITIS ON IMAGING, PMHx: CROHN'S DISEASE(SML/LGE INTESTINES)  PATIENT DATA: WEIGHT: 134 LBS GASTRIC PASSAGE TIME: 1 H 57m SB PASSAGE TIME: > 6H but study incomplete. CAPSULE DID NOT MAKE IT TO THE CECUM.   RESULTS: LIMITED views of gastric mucosa due to retained contents.  OCCASIONAL EROSION. SMALL BOWEL: OCCASIONAL VIEWS OBSCURED BY RETAINED CONTENT. No ULCERS, masses or AVMs seen.  No old blood or fresh blood in the stomach, OR small bowel.  DIAGNOSIS: MILD GASTRITIS, NORMAL SMALL BOWEL  Plan: 1. CONTINUE IMURAN. REDUCE TO 75 MG DAILY. CONTINUE REMICADE.PHENERGAN PRN. 2. NEXT OPV MAR 6.

## 2018-01-17 NOTE — Telephone Encounter (Signed)
PLEASE CALL PT. Her Givens capsule study is normal. THE CAPSULE SPENT 6 HOURS IN HER SMALL BOWEL. HOWEVER the capsule did not make it to the colon during the study. IF SHE HASN'T SEEN THE CAPSULE PASS SHE SHOULD HAVE A PLAIN ABDOMINAL FILM(KUB: UPRIGHT) TO CONFIRM IT HAS PASSED THROUGH THE COLON.  IF SHE IS STILL FEELLING NAUSEATED SHE CAN LOWER THE IMURAN TO 75 MG DAILY. SHE CAN USE PHENERGAN IF NEEDED FOR NAUSEA. THERE IS NO GI REASON SEEN ON THE EGD/TCVS/GIVENS TO ACCOUNT FOR LOSS OF APPETITE. SHE SHOULD SEE HER PCP FOR ADDITIONAL WORKUP.

## 2018-01-17 NOTE — Addendum Note (Signed)
Addended by: Danie Binder on: 01/17/2018 11:35 AM   Modules accepted: Orders

## 2018-01-17 NOTE — Telephone Encounter (Signed)
KUB upright order entered. Called and informed pt to go to [facility], no appt needed.

## 2018-01-31 ENCOUNTER — Ambulatory Visit (INDEPENDENT_AMBULATORY_CARE_PROVIDER_SITE_OTHER): Payer: Medicare Other | Admitting: Gastroenterology

## 2018-01-31 ENCOUNTER — Encounter: Payer: Self-pay | Admitting: Gastroenterology

## 2018-01-31 DIAGNOSIS — R112 Nausea with vomiting, unspecified: Secondary | ICD-10-CM

## 2018-01-31 DIAGNOSIS — K50913 Crohn's disease, unspecified, with fistula: Secondary | ICD-10-CM

## 2018-01-31 DIAGNOSIS — R197 Diarrhea, unspecified: Secondary | ICD-10-CM

## 2018-01-31 DIAGNOSIS — R634 Abnormal weight loss: Secondary | ICD-10-CM

## 2018-01-31 MED ORDER — ONDANSETRON HCL 4 MG PO TABS: ORAL_TABLET | ORAL | 1 refills | Status: DC

## 2018-01-31 NOTE — Assessment & Plan Note (Signed)
SYMPTOMS FAIRLY WELL CONTROLLED.  DRINK WATER TO KEEP YOUR URINE LIGHT YELLOW. CONTINUE AZATHIOPRINE AND REMICADE. USE ZOFRAN TO PREVENT NAUSEA BEFORE YOU EAT. TAKE 30 MINS BEFORE SHE EATS. COMPLETE LABS.  FOLLOW UP IN 2 MOS.

## 2018-01-31 NOTE — Patient Instructions (Addendum)
DRINK WATER TO KEEP YOUR URINE LIGHT YELLOW.  CONTINUE AZATHIOPRINE AND REMICADE.  USE ZOFRAN TO PREVENT NAUSEA BEFORE YOU EAT. TAKE 30 MINS BEFORE SHE EATS.  COMPLETE LABS WITHIN 7 DAYS.  FOLLOW UP IN 2 MOS.

## 2018-01-31 NOTE — Assessment & Plan Note (Signed)
SYMPTOMS NOT IDEALLY CONTROLLED. ASSOCIATED WITH WEIGHT LOSS.  DRINK WATER TO KEEP YOUR URINE LIGHT YELLOW. USE ZOFRAN TO PREVENT NAUSEA BEFORE YOU EAT. TAKE 30 MINS BEFORE SHE EATS. COMPLETE LABS: CORTISOL, HGA1C, TSH. FOLLOW UP IN 2 MOS.

## 2018-01-31 NOTE — Assessment & Plan Note (Signed)
SYMPTOMS NOT IDEALLY CONTROLLED.  CHECK TSH. ENCOURAGED PO INTAKE AFTER ZOFRAN.

## 2018-01-31 NOTE — Progress Notes (Signed)
Subjective:    Patient ID: Kendra Franklin, female    DOB: 1991-05-26, 26 y.o.   MRN: [id number]  Kendra Everts, MD  HPI Bowels are better. BM: 1-2 A DAY(#2). ATE PIZZA AND HAD 4 BMs BUT #2. NAUSEA PREVENTING FROM EATING. NO EYE PAIN, CHANGE IN VISION, HEADACHES, EAR PAIN, EAR/SINUS DRAINAGE, OR SORE THROAT, HEARTBURN/INDIGESTION. STOMACH HURTS  AROUND HER NAVEL. WORK UP IN MIDDLE OF UPPER ENDOSCOPY AND TRIED TO PULL OUT OF THROAT. THE THOUGHT OF EATING MAKES HER WANT TO VOMIT. LAST DIARRHEA: DEC 2018. PHENERGAN  PRN CAUSE SEDATION. ZOFRAN HELPS. OCCASIONAL DRAINAGE FROM HER RECTAL AREA WHEN SHE WIPES. IN GRAD SCHOOL(GERONTOLOGY) AT [employer]  PT DENIES FEVER, CHILLS, HEMATOCHEZIA, HEMATEMESIS, vomiting, melena, diarrhea, CHEST PAIN, SHORTNESS OF BREATH,  CHANGE IN BOWEL IN HABITS, constipation, problems swallowing, OR problems with sedation.  Past Medical History:  Diagnosis Date  . Allergy    medicine  . Bloody diarrhea 03/11/2015  . Crohn's    DX   DEC 2011--- ASCA 100 pANCA NEG-APR 2012 6-TGN 233(LLN 230)  6-MMPN 1173 ON 75 MG IMURAN  . Crohn's disease, small and large intestine ([place])   . History of anal fissures   . History of anal lesion    2009   NON--HEALING  . History of kidney stones   . History of small intestine ulcer    2011  . Osteoporosis    Past Surgical History:  Procedure Laterality Date  . COLONOSCOPY  DEC 2011   ILEO-COLONIC ULCERS  . COLONOSCOPY N/A 03/16/2015   SLF: 1/ The examined terminal ileum appeared to be normal 2. The left colon is redundatnt 3. Rectal bleeding due to small internal hemorrhoids  . COLONOSCOPY N/A 12/26/2017   Procedure: COLONOSCOPY;  Surgeon: Danie Binder, MD;  [facility];  Service: Endoscopy;  Laterality: N/A;  2:15pm  . CYSTOSCOPY WITH RETROGRADE PYELOGRAM, URETEROSCOPY AND STENT PLACEMENT Left 02/27/2014   Procedure: CYSTOSCOPY WITH RETROGRADE PYELOGRAM, URETEROSCOPY/ STENT PLACEMENT;  Surgeon: Alexis Frock, MD;   Location: [facility];  Service: Urology;  Laterality: Left;  . ESOPHAGOGASTRODUODENOSCOPY N/A 03/16/2015   SLF: 1. epigastric pain most likely due to gastritis/pyschosocial stressors, less likely GERD. 2. Mild non-erosive gastritis.   Marland Kitchen ESOPHAGOGASTRODUODENOSCOPY N/A 12/26/2017   Procedure: ESOPHAGOGASTRODUODENOSCOPY (EGD);  Surgeon: Danie Binder, MD;  [facility];  Service: Endoscopy;  Laterality: N/A;  . FLEXIBLE SIGMOIDOSCOPY N/A 12/11/2013   Procedure: FLEXIBLE SIGMOIDOSCOPY;  Surgeon: Danie Binder, MD;  [facility];  Service: Endoscopy;  Laterality: N/A;  1:45 PM  . GIVENS CAPSULE STUDY  DEC 2011   SB ULCERS  . GIVENS CAPSULE STUDY N/A 01/08/2018   Procedure: GIVENS CAPSULE STUDY;  Surgeon: Danie Binder, MD;  [facility];  Service: Endoscopy;  Laterality: N/A;  7:30am  . HOLMIUM LASER APPLICATION Left 02/01/1061   Procedure: HOLMIUM LASER APPLICATION;  Surgeon: Alexis Frock, MD;  Location[facility];  Service: Urology;  Laterality: Left;  . INCISION AND DRAINAGE PERIRECTAL ABSCESS  2009  &  2010   Allergies  Allergen Reactions  . Morphine Hives   Current Outpatient Medications  Medication Sig    . alendronate (FOSAMAX) 70 MG tablet Take 1 tablet (70 mg total) by mouth every 7 (seven) days.     Marland Kitchen azaTHIOprine (IMURAN) 50 MG tablet Take 2 tablets (100 mg total) by mouth daily.    Marland Kitchen inFLIXimab (REMICADE) 100 MG injection Inject 300 mg  into the vein every 8 (eight) weeks.  NEXT DOSE MAR 2019   . norethindrone 0.35 MG tablet Take 1 tablet by mouth daily.    . ondansetron  4 MG tablet Take 1 tablet PRN    . polyethylene glycol powder (GLYCOLAX/MIRALAX) powder MIX 17 GRAMS (MARKED ON CAP) WITH LIQUID AND DRINK ONCE DAILY AS NEEDED     . promethazine (PHENERGAN) 25 MG tablet Take 1/2 tablet to 1 Q6H PRN NAUSEA/VOMITING.    . traMADol (ULTRAM) 50 MG tablet Take 1 tablet (50 mg total) by mouth every 6 (six) hours as  needed.    . valACYclovir (VALTREX) 1000 MG tablet Take 1,000 mg by mouth daily.    .      .       Review of Systems PER HPI OTHERWISE ALL SYSTEMS ARE NEGATIVE.    Objective:   Physical Exam  Constitutional: She is oriented to person, place, and time. She appears well-developed and well-nourished. No distress.  HENT:  Head: Normocephalic and atraumatic.  Mouth/Throat: Oropharynx is clear and moist. No oropharyngeal exudate.  Eyes: Pupils are equal, round, and reactive to light. No scleral icterus.  Neck: Normal range of motion. Neck supple.  Cardiovascular: Normal rate, regular rhythm and normal heart sounds.  Pulmonary/Chest: Effort normal and breath sounds normal. No respiratory distress.  Abdominal: Soft. Bowel sounds are normal. She exhibits no distension. There is no tenderness.  Musculoskeletal: She exhibits no edema.  Lymphadenopathy:    She has no cervical adenopathy.  Neurological: She is alert and oriented to person, place, and time.  Psychiatric: She has a normal mood and affect.  Vitals reviewed.     Assessment & Plan:

## 2018-01-31 NOTE — Assessment & Plan Note (Signed)
SYMPTOMS CONTROLLED/RESOLVED.  CONTINUE TO MONITOR SYMPTOMS. 

## 2018-02-02 ENCOUNTER — Telehealth: Payer: Self-pay | Admitting: Gastroenterology

## 2018-02-02 NOTE — Telephone Encounter (Signed)
Pt has questions for the nurse. Please call her back at [phone]

## 2018-02-02 NOTE — Telephone Encounter (Signed)
Tried calling pt, number was busy x 2

## 2018-02-05 ENCOUNTER — Encounter: Payer: Self-pay | Admitting: Family Medicine

## 2018-02-06 NOTE — Telephone Encounter (Signed)
Routing message

## 2018-02-06 NOTE — Telephone Encounter (Signed)
PLEASE CALL PT'S MOTHER. HER PANCREAS WAS CHECKED ON LABS DEC 2018 AND IT WAS NORMA. THE CT SCAN JAN 2019 SHOWED A NORMAL PANCREAS AS WELL.

## 2018-02-06 NOTE — Telephone Encounter (Signed)
Pt said her mom wants to know if she can have a blood test to check her pancreas. She has other blood work to do and would like to do it all at the right time if needed.

## 2018-02-07 DIAGNOSIS — R112 Nausea with vomiting, unspecified: Secondary | ICD-10-CM | POA: Diagnosis not present

## 2018-02-07 DIAGNOSIS — R634 Abnormal weight loss: Secondary | ICD-10-CM | POA: Diagnosis not present

## 2018-02-07 NOTE — Telephone Encounter (Signed)
PT is aware.

## 2018-02-08 LAB — CORTISOL: CORTISOL PLASMA: 19 ug/dL

## 2018-02-08 LAB — TSH: TSH: 0.86 mIU/L

## 2018-02-21 ENCOUNTER — Other Ambulatory Visit: Payer: Self-pay

## 2018-02-21 ENCOUNTER — Telehealth: Payer: Self-pay

## 2018-02-21 ENCOUNTER — Encounter (HOSPITAL_COMMUNITY): Admission: RE | Admit: 2018-02-21 | Payer: Medicare Other | Source: Ambulatory Visit

## 2018-02-21 DIAGNOSIS — R110 Nausea: Secondary | ICD-10-CM

## 2018-02-21 DIAGNOSIS — K50913 Crohn's disease, unspecified, with fistula: Secondary | ICD-10-CM | POA: Insufficient documentation

## 2018-02-21 MED ORDER — PROMETHAZINE HCL 12.5 MG PO TABS: ORAL_TABLET | ORAL | 5 refills | Status: DC

## 2018-02-21 NOTE — Telephone Encounter (Signed)
[facility] for referral. Appt scheduled for 03/01/18 at 11:00am. Pt will receive packet in mail. Clinical notes faxed to [facility].

## 2018-02-21 NOTE — Telephone Encounter (Signed)
Pt called and is aware.

## 2018-02-21 NOTE — Telephone Encounter (Signed)
Pt called and wanted to know the results of her cortisol and thyroid lab test and was informed they were normal. She also said she is having nausea a lot and the Zofran is not working. She had diarrhea x 3 yesterday and has had 3 episodes already today. Said the stools were all watery, she has not had much food but is taking in liquids. Please advise!

## 2018-02-21 NOTE — Telephone Encounter (Signed)
Tried to call pt, no answer, unable to leave VM d/t no mailbox set up.

## 2018-02-21 NOTE — Telephone Encounter (Signed)
PLEASE CALL PT. If she doesn't eat solid food, she will not have solid stool. The only alternative to Zofran is Phenergan. She can take 1/2-1/2 a pill q6h prn nausea. I am referring her to [facility] for nausea.There is nothing else I can do for the nausea.

## 2018-02-21 NOTE — Telephone Encounter (Signed)
Tried to call and could not connect.

## 2018-02-22 NOTE — Telephone Encounter (Signed)
Tried to call pt to inform of appt with [facility], no answer, no VM.

## 2018-02-22 NOTE — Telephone Encounter (Signed)
Pt called office. Informed of [facility] appt. She needs to reschedule because she has another appt that day. Gave her phone number to [facility] to reschedule.

## 2018-02-23 ENCOUNTER — Encounter (HOSPITAL_COMMUNITY)
Admission: RE | Admit: 2018-02-23 | Discharge: 2018-02-23 | Disposition: A | Discharge status: Home / Self Care | Payer: Medicare Other | Source: Ambulatory Visit | Attending: Gastroenterology | Admitting: Gastroenterology

## 2018-02-23 ENCOUNTER — Encounter (HOSPITAL_COMMUNITY): Payer: Self-pay

## 2018-02-23 DIAGNOSIS — K50913 Crohn's disease, unspecified, with fistula: Secondary | ICD-10-CM | POA: Diagnosis not present

## 2018-02-23 MED ORDER — ACETAMINOPHEN 325 MG PO TABS
650.0000 mg | ORAL_TABLET | Freq: Once | ORAL | Status: AC
  Administered 2018-02-23: 650 mg via ORAL

## 2018-02-23 MED ORDER — SODIUM CHLORIDE 0.9 % IV SOLN
300.0000 mg | Freq: Once | INTRAVENOUS | Status: AC
  Administered 2018-02-23: 300 mg via INTRAVENOUS
  Filled 2018-02-23: qty 30

## 2018-02-23 MED ORDER — LORATADINE 10 MG PO TABS
10.0000 mg | ORAL_TABLET | Freq: Once | ORAL | Status: AC
  Administered 2018-02-23: 10 mg via ORAL

## 2018-02-23 MED ORDER — ACETAMINOPHEN 325 MG PO TABS
ORAL_TABLET | ORAL | Status: AC
  Filled 2018-02-23: qty 2

## 2018-02-23 MED ORDER — LORATADINE 10 MG PO TABS
ORAL_TABLET | ORAL | Status: AC
  Filled 2018-02-23: qty 1

## 2018-02-23 MED ORDER — SODIUM CHLORIDE 0.9 % IV SOLN
Freq: Once | INTRAVENOUS | Status: AC
  Administered 2018-02-23: 250 mL via INTRAVENOUS

## 2018-02-28 DIAGNOSIS — B977 Papillomavirus as the cause of diseases classified elsewhere: Secondary | ICD-10-CM | POA: Diagnosis not present

## 2018-02-28 DIAGNOSIS — N870 Mild cervical dysplasia: Secondary | ICD-10-CM | POA: Diagnosis not present

## 2018-02-28 DIAGNOSIS — R87619 Unspecified abnormal cytological findings in specimens from cervix uteri: Secondary | ICD-10-CM | POA: Diagnosis not present

## 2018-03-20 ENCOUNTER — Telehealth: Payer: Self-pay | Admitting: Family Medicine

## 2018-03-20 NOTE — Telephone Encounter (Signed)
Spoke with patient and advised her Dr.Nelson would be unable to order requested DME supplies. Patient would need OV prior to supplies being ordered and it is too late for patient to be seen with Dr.Nelson. Patient verbalized understanding.

## 2018-03-20 NOTE — Telephone Encounter (Signed)
Please advise. Thank you

## 2018-03-20 NOTE — Telephone Encounter (Signed)
Cannot order DME without a visit and it is too late to get one.

## 2018-03-20 NOTE — Telephone Encounter (Signed)
Patient is requesting a prescription for a walker, a toilet seat, and a shower chair. She does  Not have a new pcp yet. Ginger [employer]. Cb#: [phone]

## 2018-04-10 DIAGNOSIS — Z789 Other specified health status: Secondary | ICD-10-CM | POA: Diagnosis not present

## 2018-04-10 DIAGNOSIS — Z299 Encounter for prophylactic measures, unspecified: Secondary | ICD-10-CM | POA: Diagnosis not present

## 2018-04-10 DIAGNOSIS — K5090 Crohn's disease, unspecified, without complications: Secondary | ICD-10-CM | POA: Diagnosis not present

## 2018-04-10 DIAGNOSIS — M810 Age-related osteoporosis without current pathological fracture: Secondary | ICD-10-CM | POA: Diagnosis not present

## 2018-04-10 DIAGNOSIS — Z6820 Body mass index (BMI) 20.0-20.9, adult: Secondary | ICD-10-CM | POA: Diagnosis not present

## 2018-04-10 DIAGNOSIS — R5383 Other fatigue: Secondary | ICD-10-CM | POA: Diagnosis not present

## 2018-04-11 ENCOUNTER — Telehealth: Payer: Self-pay | Admitting: Gastroenterology

## 2018-04-11 NOTE — Telephone Encounter (Signed)
Pt called to let us know that she has meet with her new PCP and they are referring her for a second opinion to a doctor at [facility]. She said that we may get a release to send her records, but she wanted to let us know that she isn't transferring care.

## 2018-04-11 NOTE — Telephone Encounter (Signed)
Forwarding to [name].

## 2018-04-13 NOTE — Telephone Encounter (Signed)
REVIEWED-NO ADDITIONAL RECOMMENDATIONS. 

## 2018-04-20 ENCOUNTER — Encounter (HOSPITAL_COMMUNITY): Admission: RE | Admit: 2018-04-20 | Payer: Medicare Other | Source: Ambulatory Visit

## 2018-04-25 ENCOUNTER — Encounter (HOSPITAL_COMMUNITY)
Admission: RE | Admit: 2018-04-25 | Discharge: 2018-04-25 | Disposition: A | Discharge status: Home / Self Care | Payer: Medicare Other | Source: Ambulatory Visit | Attending: Gastroenterology | Admitting: Gastroenterology

## 2018-04-25 DIAGNOSIS — K50913 Crohn's disease, unspecified, with fistula: Secondary | ICD-10-CM | POA: Diagnosis not present

## 2018-04-25 MED ORDER — LORATADINE 10 MG PO TABS
ORAL_TABLET | ORAL | Status: AC
  Filled 2018-04-25: qty 1

## 2018-04-25 MED ORDER — FENTANYL CITRATE (PF) 100 MCG/2ML IJ SOLN
INTRAMUSCULAR | Status: AC
  Filled 2018-04-25: qty 2

## 2018-04-25 MED ORDER — SODIUM CHLORIDE 0.9 % IV SOLN
300.0000 mg | INTRAVENOUS | Status: DC
  Administered 2018-04-25: 300 mg via INTRAVENOUS
  Filled 2018-04-25: qty 30

## 2018-04-25 MED ORDER — FENTANYL CITRATE (PF) 100 MCG/2ML IJ SOLN
25.0000 ug | Freq: Once | INTRAMUSCULAR | Status: AC
Start: 2018-04-25 — End: 2018-04-25
  Administered 2018-04-25: 25 ug via INTRAVENOUS

## 2018-04-25 MED ORDER — ACETAMINOPHEN 325 MG PO TABS
650.0000 mg | ORAL_TABLET | Freq: Once | ORAL | Status: AC
  Administered 2018-04-25: 650 mg via ORAL

## 2018-04-25 MED ORDER — ACETAMINOPHEN 325 MG PO TABS
ORAL_TABLET | ORAL | Status: AC
  Filled 2018-04-25: qty 2

## 2018-04-25 MED ORDER — SODIUM CHLORIDE 0.9 % IV SOLN
INTRAVENOUS | Status: DC
  Administered 2018-04-25: 250 mL via INTRAVENOUS

## 2018-04-25 MED ORDER — LORATADINE 10 MG PO TABS
10.0000 mg | ORAL_TABLET | Freq: Every day | ORAL | Status: DC
  Administered 2018-04-25: 10 mg via ORAL

## 2018-04-25 NOTE — Progress Notes (Signed)
Dr [name] notified of pt c/o of abd and rectal pain. Order for fentanyl given.

## 2018-04-25 NOTE — Progress Notes (Signed)
Continues c/o abd pain and rectal pain. Rates abd pain 5 and rectal pain 7. Fentanyl 25 mcg iv given.

## 2018-05-16 DIAGNOSIS — L738 Other specified follicular disorders: Secondary | ICD-10-CM | POA: Diagnosis not present

## 2018-05-16 DIAGNOSIS — B001 Herpesviral vesicular dermatitis: Secondary | ICD-10-CM | POA: Diagnosis not present

## 2018-05-18 DIAGNOSIS — Z681 Body mass index (BMI) 19 or less, adult: Secondary | ICD-10-CM | POA: Diagnosis not present

## 2018-05-18 DIAGNOSIS — M810 Age-related osteoporosis without current pathological fracture: Secondary | ICD-10-CM | POA: Diagnosis not present

## 2018-05-18 DIAGNOSIS — K5090 Crohn's disease, unspecified, without complications: Secondary | ICD-10-CM | POA: Diagnosis not present

## 2018-05-18 DIAGNOSIS — Z299 Encounter for prophylactic measures, unspecified: Secondary | ICD-10-CM | POA: Diagnosis not present

## 2018-05-18 DIAGNOSIS — R5383 Other fatigue: Secondary | ICD-10-CM | POA: Diagnosis not present

## 2018-05-18 DIAGNOSIS — Z713 Dietary counseling and surveillance: Secondary | ICD-10-CM | POA: Diagnosis not present

## 2018-06-01 ENCOUNTER — Emergency Department (HOSPITAL_COMMUNITY)
Admission: EM | Admit: 2018-06-01 | Discharge: 2018-06-01 | Disposition: A | Discharge status: Home / Self Care | Payer: Medicare Other | Attending: Emergency Medicine | Admitting: Emergency Medicine

## 2018-06-01 ENCOUNTER — Encounter (HOSPITAL_COMMUNITY): Payer: Self-pay | Admitting: *Deleted

## 2018-06-01 ENCOUNTER — Other Ambulatory Visit: Payer: Self-pay

## 2018-06-01 DIAGNOSIS — R112 Nausea with vomiting, unspecified: Secondary | ICD-10-CM | POA: Diagnosis not present

## 2018-06-01 DIAGNOSIS — R630 Anorexia: Secondary | ICD-10-CM | POA: Diagnosis not present

## 2018-06-01 LAB — CBC
HEMATOCRIT: 40 % (ref 36.0–46.0)
HEMOGLOBIN: 13.6 g/dL (ref 12.0–15.0)
MCH: 33.5 pg (ref 26.0–34.0)
MCHC: 34 g/dL (ref 30.0–36.0)
MCV: 98.5 fL (ref 78.0–100.0)
Platelets: 216 10*3/uL (ref 150–400)
RBC: 4.06 MIL/uL (ref 3.87–5.11)
RDW: 12.5 % (ref 11.5–15.5)
WBC: 4 10*3/uL (ref 4.0–10.5)

## 2018-06-01 LAB — URINALYSIS, ROUTINE W REFLEX MICROSCOPIC
BILIRUBIN URINE: NEGATIVE
Glucose, UA: NEGATIVE mg/dL
HGB URINE DIPSTICK: NEGATIVE
Ketones, ur: NEGATIVE mg/dL
Leukocytes, UA: NEGATIVE
Nitrite: NEGATIVE
PH: 6 (ref 5.0–8.0)
Protein, ur: NEGATIVE mg/dL
SPECIFIC GRAVITY, URINE: 1.02 (ref 1.005–1.030)

## 2018-06-01 LAB — COMPREHENSIVE METABOLIC PANEL
ALBUMIN: 4.3 g/dL (ref 3.5–5.0)
ALK PHOS: 45 U/L (ref 38–126)
ALT: 10 U/L (ref 0–44)
AST: 18 U/L (ref 15–41)
Anion gap: 10 (ref 5–15)
BILIRUBIN TOTAL: 0.8 mg/dL (ref 0.3–1.2)
BUN: 10 mg/dL (ref 6–20)
CO2: 23 mmol/L (ref 22–32)
CREATININE: 0.73 mg/dL (ref 0.44–1.00)
Calcium: 9.5 mg/dL (ref 8.9–10.3)
Chloride: 107 mmol/L (ref 98–111)
GFR calc non Af Amer: >60 mL/min (ref >60)
GLUCOSE: 88 mg/dL (ref 70–99)
Potassium: 4 mmol/L (ref 3.5–5.1)
SODIUM: 140 mmol/L (ref 135–145)
Total Protein: 8.4 g/dL — ABNORMAL HIGH (ref 6.5–8.1)

## 2018-06-01 LAB — LIPASE, BLOOD: Lipase: 36 U/L (ref 11–51)

## 2018-06-01 LAB — HCG, QUANTITATIVE, PREGNANCY: hCG, Beta Chain, Quant, S: <1 m[IU]/mL (ref <5)

## 2018-06-01 MED ORDER — METOCLOPRAMIDE HCL 10 MG PO TABS: 10.0000 mg | ORAL_TABLET | Freq: Three times a day (TID) | ORAL | 0 refills | Status: DC | PRN

## 2018-06-01 NOTE — ED Notes (Signed)
Pt reports she has lost 20lbs since November, has followed up with her GI doctor and pt states she couldn't find anything wrong with her. Was given a prescription for Phenergan but pt does not take it d/t it causing drowsiness. No V/D/ today states she dry heaves. Pt playing on phone and asking for pain medication. NAD noted.

## 2018-06-01 NOTE — ED Provider Notes (Signed)
Emergency Department Provider Note   I have reviewed the triage vital signs and the nursing notes.   HISTORY  Chief Complaint Emesis   HPI Kendra Franklin is a 27 y.o. female with multiple medical problems as documented below the presents to the emergency department today with chronic symptoms.  She states that she has had vomiting all times a day but does seem to be relieved by Phenergan but it makes her really sleepy.  She is also tried Zofran which does not seem to have helped.  She is also had chronic abdominal pain during this time as well.  States his symptoms have been a lot worse since November.  She states 21 pounds of weight loss.  They are waxing and waning somewhat.  She seen her gastroenterologist who did not seem to help her much so she is got an appointment with a different one at [facility] in a couple months.  No real new symptoms today.  She is been having normal bowel movements normal urination at this time is no abdominal pain no chest pain or back pain.  No headache or vision changes. No other associated or modifying symptoms.    Past Medical History:  Diagnosis Date  . Allergy    medicine  . Bloody diarrhea 03/11/2015  . Crohn's    DX   DEC 2011--- ASCA 100 pANCA NEG-APR 2012 6-TGN 233(LLN 230)  6-MMPN 1173 ON 75 MG IMURAN  . Crohn's disease, small and large intestine ([place])   . History of anal fissures   . History of anal lesion    2009   NON--HEALING  . History of kidney stones   . History of small intestine ulcer    2011  . Osteoporosis     Patient Active Problem List   Diagnosis Date Noted  . Loss of weight   . Nausea with vomiting 12/06/2017  . 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 Diarrhea 09/04/2017  . 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 AP (abdominal pain)   . Bloody diarrhea 03/11/2015  . Crohn's disease of intestine with fistula ([place]) 12/23/2010  . 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 COLONOSCOPY  DEC 2011   ILEO-COLONIC ULCERS  . COLONOSCOPY N/A 03/16/2015   SLF: 1/ The examined terminal ileum appeared to be normal 2. The left colon is redundatnt 3. Rectal bleeding due to small internal hemorrhoids  . COLONOSCOPY N/A 12/26/2017   Procedure: COLONOSCOPY;  Surgeon: Danie Binder, MD;  [facility];  Service: Endoscopy;  Laterality: N/A;  2:15pm  . CYSTOSCOPY WITH RETROGRADE PYELOGRAM, URETEROSCOPY AND STENT PLACEMENT Left 02/27/2014   Procedure: CYSTOSCOPY WITH RETROGRADE PYELOGRAM, URETEROSCOPY/ STENT PLACEMENT;  Surgeon: Alexis Frock, MD;  Location: [facility];  Service: Urology;  Laterality: Left;  . ESOPHAGOGASTRODUODENOSCOPY N/A 03/16/2015   SLF: 1. epigastric pain most likely due to gastritis/pyschosocial stressors, less likely GERD. 2. Mild non-erosive gastritis.   Marland Kitchen ESOPHAGOGASTRODUODENOSCOPY N/A 12/26/2017   Procedure: ESOPHAGOGASTRODUODENOSCOPY (EGD);  Surgeon: Danie Binder, MD;  [facility];  Service: Endoscopy;  Laterality: N/A;  . FLEXIBLE SIGMOIDOSCOPY N/A 12/11/2013   Procedure: FLEXIBLE SIGMOIDOSCOPY;  Surgeon: Danie Binder, MD;  [facility];  Service: Endoscopy;  Laterality: N/A;  1:45 PM  . GIVENS CAPSULE STUDY  DEC 2011   SB ULCERS  . GIVENS CAPSULE STUDY N/A 01/08/2018   Procedure: GIVENS CAPSULE STUDY;  Surgeon: Danie Binder, MD;  [facility];  Service: Endoscopy;  Laterality: N/A;  7:30am  . HOLMIUM LASER APPLICATION Left 07/30/3299   Procedure: HOLMIUM LASER APPLICATION;  Surgeon: Alexis Frock, MD;  Location: [facility];  Service: Urology;  Laterality: Left;  . INCISION AND DRAINAGE PERIRECTAL ABSCESS  2009  &  2010    Current Outpatient Rx  . Order #: [id number] Class: Normal  . Order #: [id number] Class: Normal  . Order #: [id number] Class: Historical Med  . Order #: [id number] Class: Normal  . Order #: [id number] Class: Print  . Order #: [id number] Class:  Historical Med  . Order #: [id number] Class: Print    Allergies Morphine  Family History  Problem Relation Age of Onset  . Diabetes Maternal Grandmother   . Colon cancer Neg Hx   . Colon polyps Neg Hx   . Inflammatory bowel disease Neg Hx     Social History Social History   Tobacco Use  . Smoking status: Never Smoker  . Smokeless tobacco: Never Used  Substance Use Topics  . Alcohol use: Yes    Comment: RARE  . Drug use: No    Review of Systems  All other systems negative except as documented in the HPI. All pertinent positives and negatives as reviewed in the HPI. ____________________________________________   PHYSICAL EXAM:  VITAL SIGNS: ED Triage Vitals  Enc Vitals Group     BP 06/01/18 1210 114/82     Pulse Rate 06/01/18 1210 88     Resp 06/01/18 1210 14     Temp 06/01/18 1210 98.6 F (37 C)     Temp Source 06/01/18 1210 Oral     SpO2 06/01/18 1210 99 %     Weight 06/01/18 1211 123 lb (55.8 kg)     Height 06/01/18 1211 5' 4"  (1.626 m)     Head Circumference --      Peak Flow --      Pain Score 06/01/18 1211 6     Pain Loc --      Pain Edu? --      Excl. in [place]? --     Constitutional: Alert and oriented. Well appearing and in no acute distress. Eyes: Conjunctivae are normal. PERRL. EOMI. Head: Atraumatic. Nose: No congestion/rhinnorhea. Mouth/Throat: Mucous membranes are moist.  Oropharynx non-erythematous. Neck: No stridor.  No meningeal signs.   Cardiovascular: Normal rate, regular rhythm. Good peripheral circulation. Grossly normal heart sounds.   Respiratory: Normal respiratory effort.  No retractions. Lungs CTAB. Gastrointestinal: Soft and nontender. No distention.  Musculoskeletal: No lower extremity tenderness nor edema. No gross deformities of extremities. Neurologic:  Normal speech and language. No gross focal neurologic deficits are appreciated.  Skin:  Skin is warm, dry and intact. No rash  noted.   ____________________________________________   LABS (all labs ordered are listed, but only abnormal results are displayed)  Labs Reviewed  COMPREHENSIVE METABOLIC PANEL - Abnormal; Notable for the following components:      Result Value   Total Protein 8.4 (*)    All other components within normal limits  LIPASE, BLOOD  CBC  URINALYSIS, ROUTINE W REFLEX MICROSCOPIC  HCG, QUANTITATIVE, PREGNANCY    ____________________________________________   INITIAL IMPRESSION / ASSESSMENT AND PLAN / ED COURSE  Normal vital signs with normal physical exam and normal labs make any kind of emergent cause of her symptoms unlikely.  Especially given the chronicity of them.  She does seem to have weight loss and she is requesting a medication to help with her appetite however I  do not think this is something within my scope of practice.  She has GI follow-up and is not in any distress at this time.  There may be something abnormal going on causing her symptoms however no further emergent work-up is indicated at this time.   Pertinent labs & imaging results that were available during my care of the patient were reviewed by me and considered in my medical decision making (see chart for details).  ____________________________________________  FINAL CLINICAL IMPRESSION(S) / ED DIAGNOSES  Final diagnoses:  Non-intractable vomiting with nausea, unspecified vomiting type  Decreased appetite     MEDICATIONS GIVEN DURING THIS VISIT:  Medications - No data to display   NEW OUTPATIENT MEDICATIONS STARTED DURING THIS VISIT:  New Prescriptions   METOCLOPRAMIDE (REGLAN) 10 MG TABLET    Take 1 tablet (10 mg total) by mouth every 8 (eight) hours as needed for nausea or vomiting.    Note:  This note was prepared with assistance of Dragon voice recognition software. Occasional wrong-word or sound-a-like substitutions may have occurred due to the inherent limitations of voice recognition  software.   Merrily Pew, MD 06/01/18 1447

## 2018-06-01 NOTE — ED Triage Notes (Signed)
Pt with N/V since November, last seen PCP in January and has not called PCP concerning this.

## 2018-06-06 ENCOUNTER — Telehealth: Payer: Self-pay | Admitting: Gastroenterology

## 2018-06-06 DIAGNOSIS — K5090 Crohn's disease, unspecified, without complications: Secondary | ICD-10-CM | POA: Diagnosis not present

## 2018-06-06 DIAGNOSIS — Z299 Encounter for prophylactic measures, unspecified: Secondary | ICD-10-CM | POA: Diagnosis not present

## 2018-06-06 DIAGNOSIS — Z681 Body mass index (BMI) 19 or less, adult: Secondary | ICD-10-CM | POA: Diagnosis not present

## 2018-06-06 DIAGNOSIS — Z713 Dietary counseling and surveillance: Secondary | ICD-10-CM | POA: Diagnosis not present

## 2018-06-06 NOTE — Telephone Encounter (Signed)
LMOM to call.

## 2018-06-06 NOTE — Telephone Encounter (Signed)
Pt called wanting to see SF ASAP. She is aware that SF is out of the office this week and her next available opening will be in OCT. Please has OV and is aware, but would like for SF to call her when she gets back. [phone]

## 2018-06-14 NOTE — Telephone Encounter (Addendum)
Called patient TO DISCUSS CONCERNS. WENT TO HOSPITAL BECAUSE STOMACH HURTING REALLY BAD, THROWING UP, AND HAVING DIARRHEA. THEN WENT TO DR. SHAW. PUT ON STEROIDS AND OMEPRAZOLE.  FINISHED STEROIDS. BMs: GOOD. FEELS LIKE REMICADE WEARS OFF.    PLAN TO CHANGE REMICADE TO EVERY 6 WEEKS. EXPLAINED THAT STEROIDS NOT A LONG TERM MANAGEMENT STRATEGY. IF REMICADE Q6 WEEKS DOESN'T CONTROL HER DISEASE AFTER JUL 25, MID AUG, END OF SEP DOSING THEN WE WILL NEED TO CHANGE TO ANOTHER AGENT.  PT WILL CALL WITH QUESTIONS OR CONCERNS.

## 2018-06-19 DIAGNOSIS — N644 Mastodynia: Secondary | ICD-10-CM | POA: Diagnosis not present

## 2018-06-19 DIAGNOSIS — N649 Disorder of breast, unspecified: Secondary | ICD-10-CM | POA: Diagnosis not present

## 2018-06-20 ENCOUNTER — Encounter (HOSPITAL_COMMUNITY): Payer: Self-pay

## 2018-06-20 ENCOUNTER — Encounter (HOSPITAL_COMMUNITY)
Admission: RE | Admit: 2018-06-20 | Discharge: 2018-06-20 | Disposition: A | Discharge status: Home / Self Care | Payer: Medicare Other | Source: Ambulatory Visit | Attending: Gastroenterology | Admitting: Gastroenterology

## 2018-06-20 DIAGNOSIS — K50913 Crohn's disease, unspecified, with fistula: Secondary | ICD-10-CM | POA: Diagnosis not present

## 2018-06-20 MED ORDER — LORATADINE 10 MG PO TABS
ORAL_TABLET | ORAL | Status: AC
  Filled 2018-06-20: qty 1

## 2018-06-20 MED ORDER — LORATADINE 10 MG PO TABS
10.0000 mg | ORAL_TABLET | Freq: Once | ORAL | Status: AC
  Administered 2018-06-20: 10 mg via ORAL

## 2018-06-20 MED ORDER — SODIUM CHLORIDE 0.9 % IV SOLN
300.0000 mg | Freq: Once | INTRAVENOUS | Status: AC
  Administered 2018-06-20: 300 mg via INTRAVENOUS
  Filled 2018-06-20 (×2): qty 30

## 2018-06-20 MED ORDER — ACETAMINOPHEN 325 MG PO TABS
650.0000 mg | ORAL_TABLET | Freq: Once | ORAL | Status: AC
  Administered 2018-06-20: 650 mg via ORAL

## 2018-06-20 MED ORDER — ACETAMINOPHEN 325 MG PO TABS
ORAL_TABLET | ORAL | Status: AC
  Filled 2018-06-20: qty 2

## 2018-06-20 MED ORDER — SODIUM CHLORIDE 0.9 % IV SOLN
Freq: Once | INTRAVENOUS | Status: AC
  Administered 2018-06-20: 10:00:00 via INTRAVENOUS

## 2018-06-21 DIAGNOSIS — Z713 Dietary counseling and surveillance: Secondary | ICD-10-CM | POA: Diagnosis not present

## 2018-06-21 DIAGNOSIS — Z6820 Body mass index (BMI) 20.0-20.9, adult: Secondary | ICD-10-CM | POA: Diagnosis not present

## 2018-06-21 DIAGNOSIS — Z299 Encounter for prophylactic measures, unspecified: Secondary | ICD-10-CM | POA: Diagnosis not present

## 2018-06-21 DIAGNOSIS — K5090 Crohn's disease, unspecified, without complications: Secondary | ICD-10-CM | POA: Diagnosis not present

## 2018-06-28 DIAGNOSIS — K50012 Crohn's disease of small intestine with intestinal obstruction: Secondary | ICD-10-CM | POA: Diagnosis not present

## 2018-07-04 DIAGNOSIS — K50012 Crohn's disease of small intestine with intestinal obstruction: Secondary | ICD-10-CM | POA: Diagnosis not present

## 2018-07-09 ENCOUNTER — Ambulatory Visit: Payer: Medicare Other | Admitting: Family Medicine

## 2018-07-31 DIAGNOSIS — Z6820 Body mass index (BMI) 20.0-20.9, adult: Secondary | ICD-10-CM | POA: Diagnosis not present

## 2018-07-31 DIAGNOSIS — Z299 Encounter for prophylactic measures, unspecified: Secondary | ICD-10-CM | POA: Diagnosis not present

## 2018-07-31 DIAGNOSIS — Z713 Dietary counseling and surveillance: Secondary | ICD-10-CM | POA: Diagnosis not present

## 2018-07-31 DIAGNOSIS — R110 Nausea: Secondary | ICD-10-CM | POA: Diagnosis not present

## 2018-08-01 ENCOUNTER — Encounter (HOSPITAL_COMMUNITY): Payer: Self-pay

## 2018-08-01 ENCOUNTER — Encounter (HOSPITAL_COMMUNITY)
Admission: RE | Admit: 2018-08-01 | Discharge: 2018-08-01 | Disposition: A | Discharge status: Home / Self Care | Payer: Medicare Other | Source: Ambulatory Visit | Attending: Gastroenterology | Admitting: Gastroenterology

## 2018-08-01 DIAGNOSIS — K5090 Crohn's disease, unspecified, without complications: Secondary | ICD-10-CM | POA: Insufficient documentation

## 2018-08-01 MED ORDER — SODIUM CHLORIDE 0.9 % IV SOLN
300.0000 mg | Freq: Once | INTRAVENOUS | Status: AC
  Administered 2018-08-01: 300 mg via INTRAVENOUS
  Filled 2018-08-01: qty 30

## 2018-08-01 MED ORDER — LORATADINE 10 MG PO TABS
10.0000 mg | ORAL_TABLET | Freq: Once | ORAL | Status: AC
  Administered 2018-08-01: 10 mg via ORAL
  Filled 2018-08-01: qty 1

## 2018-08-01 MED ORDER — ACETAMINOPHEN 325 MG PO TABS
650.0000 mg | ORAL_TABLET | Freq: Once | ORAL | Status: AC
  Administered 2018-08-01: 650 mg via ORAL
  Filled 2018-08-01: qty 2

## 2018-08-01 MED ORDER — SODIUM CHLORIDE 0.9 % IV SOLN
Freq: Once | INTRAVENOUS | Status: AC
  Administered 2018-08-01: 09:00:00 via INTRAVENOUS

## 2018-08-30 ENCOUNTER — Encounter

## 2018-08-30 ENCOUNTER — Encounter: Payer: Self-pay | Admitting: Gastroenterology

## 2018-08-30 ENCOUNTER — Ambulatory Visit (INDEPENDENT_AMBULATORY_CARE_PROVIDER_SITE_OTHER): Payer: Medicare Other | Admitting: Gastroenterology

## 2018-08-30 ENCOUNTER — Other Ambulatory Visit: Payer: Self-pay | Admitting: *Deleted

## 2018-08-30 DIAGNOSIS — R51 Headache: Principal | ICD-10-CM

## 2018-08-30 DIAGNOSIS — K50913 Crohn's disease, unspecified, with fistula: Secondary | ICD-10-CM | POA: Diagnosis not present

## 2018-08-30 DIAGNOSIS — R110 Nausea: Secondary | ICD-10-CM

## 2018-08-30 DIAGNOSIS — R634 Abnormal weight loss: Secondary | ICD-10-CM

## 2018-08-30 DIAGNOSIS — R519 Headache, unspecified: Secondary | ICD-10-CM

## 2018-08-30 MED ORDER — ONDANSETRON HCL 4 MG PO TABS: ORAL_TABLET | ORAL | 0 refills | Status: DC

## 2018-08-30 NOTE — Patient Instructions (Addendum)
TAKE ZOFRAN AT BEDTIME AND BEFORE YOU GET OUT OF BED.  TAKE OMEPRAZOLE WITH ZOFRAN IN THE AM.  CONTINUE IMURAN AND REMICADE.  WE WILL CHECK LABS ONE WEEK PRIOR TO YOUR NEXT VISIT.   SEE NEUROLOGY REGARDING SHAKES AND TREMORS.   FOLLOW UP IN 4 MOS.

## 2018-08-30 NOTE — Assessment & Plan Note (Addendum)
WEIGHT 123 LBS DOWN TO 119.8 LBS.  CONTINUE TO MONITOR SYMPTOMS.

## 2018-08-30 NOTE — Progress Notes (Signed)
Subjective:    Patient ID: Kendra Franklin, female    DOB: [birth year], 27 y.o.   MRN: [id number]  Monico Blitz, MD  HPI [facility] AND IMURAN. BEEN HAVING THE SHAKES AND WHOLE BODY LIKE IT'S IN SHOCK. BEEN GOING ON FOR ABOUT A YEAR. MAY LAST ABOUT 30 MINS. FEELS LIKE SHE'S ABOUT TO PASS OUT. HASN'T SEEN DOONQUAH. BMs: #3-4, 1-2X/DAY. 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  OCCASIONAL NAUSEA AND HEADACHES. EVERY MORNING AND IN THE AFTERNOON SHE DOESN'T WANT TO EAT.   PT DENIES FEVER, CHILLS, HEMATOCHEZIA, HEMATEMESIS, vomiting, melena, diarrhea, CHEST PAIN, SHORTNESS OF BREATH,  CHANGE IN BOWEL IN HABITS, constipation, abdominal pain, problems swallowing, problems with sedation, heartburn or indigestion.  Past Medical History:  Diagnosis Date  . Allergy    medicine  . Bloody diarrhea 03/11/2015  . Crohn's    DX   DEC 2011--- ASCA 100 pANCA NEG-APR 2012 6-TGN 233(LLN 230)  6-MMPN 1173 ON 75 MG IMURAN  . Crohn's disease, small and large intestine ([place])   . History of anal fissures   . History of anal lesion    2009   NON--HEALING  . History of kidney stones   . History of small intestine ulcer    2011  . Osteoporosis     Past Surgical History:  Procedure Laterality Date  . COLONOSCOPY  DEC 2011   ILEO-COLONIC ULCERS  . COLONOSCOPY N/A 03/16/2015   SLF: 1/ The examined terminal ileum appeared to be normal 2. The left colon is redundatnt 3. Rectal bleeding due to small internal hemorrhoids  . COLONOSCOPY N/A 12/26/2017   Procedure: COLONOSCOPY;  Surgeon: Danie Binder, MD;  [facility];  Service: Endoscopy;  Laterality: N/A;  2:15pm  . CYSTOSCOPY WITH RETROGRADE PYELOGRAM, URETEROSCOPY AND STENT PLACEMENT Left 02/27/2014   Procedure: CYSTOSCOPY WITH RETROGRADE PYELOGRAM, URETEROSCOPY/ STENT PLACEMENT;  Surgeon: Alexis Frock, MD;  Location: [facility];  Service: Urology;  Laterality: Left;  . ESOPHAGOGASTRODUODENOSCOPY N/A  03/16/2015   SLF: 1. epigastric pain most likely due to gastritis/pyschosocial stressors, less likely GERD. 2. Mild non-erosive gastritis.   Marland Kitchen ESOPHAGOGASTRODUODENOSCOPY N/A 12/26/2017   Procedure: ESOPHAGOGASTRODUODENOSCOPY (EGD);  Surgeon: Danie Binder, MD;  [facility];  Service: Endoscopy;  Laterality: N/A;  . FLEXIBLE SIGMOIDOSCOPY N/A 12/11/2013   Procedure: FLEXIBLE SIGMOIDOSCOPY;  Surgeon: Danie Binder, MD;  [facility];  Service: Endoscopy;  Laterality: N/A;  1:45 PM  . GIVENS CAPSULE STUDY  DEC 2011   SB ULCERS  . GIVENS CAPSULE STUDY N/A 01/08/2018   Procedure: GIVENS CAPSULE STUDY;  Surgeon: Danie Binder, MD;  [facility];  Service: Endoscopy;  Laterality: N/A;  7:30am  . HOLMIUM LASER APPLICATION Left 1/0/2725   Procedure: HOLMIUM LASER APPLICATION;  Surgeon: Alexis Frock, MD;  Location: [facility];  Service: Urology;  Laterality: Left;  . INCISION AND DRAINAGE PERIRECTAL ABSCESS  2009  &  2010   Allergies  Allergen Reactions  . Morphine Hives  . Other Rash    [place]an causes bruising and rash    Current Outpatient Medications  Medication Sig    . azaTHIOprine (IMURAN) 50 MG tablet Take 2 tablets (100 mg total) by mouth daily.    . drospirenone-ethinyl estradiol (YAZ,GIANVI,LORYNA) 3-0.02 MG tablet Take 1 tablet by mouth daily.    Marland Kitchen inFLIXimab (REMICADE) 100 MG injection Inject 300 mg into the vein every 6 (six) weeks. LAST INJECTION NOV 2017    .  polyethylene glycol powder (GLYCOLAX/MIRALAX) powder MIX 17 GRAMS (MARKED ON CAP) WITH LIQUID AND DRINK ONCE DAILY AS NEEDED FOR CONTSTIPATION)    . traMADol (ULTRAM) 50 MG tablet Take 1 tablet (50 mg total) by mouth every 6 (six) hours as needed.    . valACYclovir (VALTREX) 1000 MG tablet Take 1,000 mg by mouth daily.    Marland Kitchen OMEPRAZOLE ONCE A DAY    .        Review of Systems PER HPI OTHERWISE ALL SYSTEMS ARE NEGATIVE.    Objective:   Physical Exam  Constitutional: She  is oriented to person, place, and time. She appears well-developed and well-nourished. No distress.  HENT:  Head: Normocephalic and atraumatic.  Mouth/Throat: Oropharynx is clear and moist. No oropharyngeal exudate.  Eyes: Pupils are equal, round, and reactive to light. No scleral icterus.  Neck: Normal range of motion. Neck supple.  Cardiovascular: Normal rate, regular rhythm and normal heart sounds.  Pulmonary/Chest: Effort normal and breath sounds normal. No respiratory distress.  Abdominal: Soft. Bowel sounds are normal. She exhibits no distension. There is no tenderness.  Musculoskeletal: She exhibits no edema.  Lymphadenopathy:    She has no cervical adenopathy.  Neurological: She is alert and oriented to person, place, and time.  NO  NEW FOCAL DEFICITS  Psychiatric: She has a normal mood and affect.  Vitals reviewed.     Assessment & Plan:

## 2018-08-30 NOTE — Assessment & Plan Note (Addendum)
SYMPTOMS FAIRLY WELL CONTROLLED.  CONTINUE IMURAN AND REMICADE. WE WILL CHECK LABS ONE WEEK PRIOR TO YOUR NEXT VISIT. FOLLOW UP IN 4 MOS.

## 2018-08-30 NOTE — Assessment & Plan Note (Addendum)
LIKELY DUE TO GERD/GASTRITIS.  TAKE ZOFRAN AT BEDTIME AND BEFORE YOU GET OUT OF BED. TAKE OMEPRAZOLE WITH ZOFRAN IN THE AM. FOLLOW UP IN 4 MOS.

## 2018-09-03 NOTE — Progress Notes (Signed)
CC'D TO PCP °

## 2018-09-03 NOTE — Progress Notes (Signed)
ON RECALL  °

## 2018-09-04 ENCOUNTER — Telehealth: Payer: Self-pay

## 2018-09-04 ENCOUNTER — Other Ambulatory Visit: Payer: Self-pay | Admitting: "Endocrinology

## 2018-09-04 DIAGNOSIS — T380X5A Adverse effect of glucocorticoids and synthetic analogues, initial encounter: Principal | ICD-10-CM

## 2018-09-04 DIAGNOSIS — M818 Other osteoporosis without current pathological fracture: Secondary | ICD-10-CM

## 2018-09-04 NOTE — Telephone Encounter (Signed)
Kendra Franklin has called requesting an appt. She states that she needs to talk to Dr Dorris Fetch about "being able to start taking the Depo shot again for birth control. She states she was told by Dr Dorris Fetch that she could not take it due to her Bone Density results". If this is an appt she need what blood test will she need prior to this.

## 2018-09-04 NOTE — Telephone Encounter (Signed)
I ordered some labs, and she can come in 1 week.

## 2018-09-05 ENCOUNTER — Other Ambulatory Visit: Payer: Self-pay

## 2018-09-05 DIAGNOSIS — M818 Other osteoporosis without current pathological fracture: Secondary | ICD-10-CM | POA: Diagnosis not present

## 2018-09-05 DIAGNOSIS — T380X5A Adverse effect of glucocorticoids and synthetic analogues, initial encounter: Principal | ICD-10-CM

## 2018-09-05 NOTE — Telephone Encounter (Signed)
Pt.notified

## 2018-09-08 LAB — C-TERMINAL TELOPEPTIDE: C-Telopeptide (CTx): 356 pg/mL (ref 64–640)

## 2018-09-08 LAB — PTH, INTACT AND CALCIUM
Calcium: 9.6 mg/dL (ref 8.6–10.2)
PTH: 26 pg/mL (ref 14–64)

## 2018-09-08 LAB — PROCOLLAGEN TYPE I INTACT N TERMINAL PROPEPTIDE: Procollagen Type I Intact N Terminal Propeptide: 92 mcg/L (ref 22–104)

## 2018-09-08 LAB — VITAMIN D 25 HYDROXY (VIT D DEFICIENCY, FRACTURES): Vit D, 25-Hydroxy: 50 ng/mL (ref 30–100)

## 2018-09-08 LAB — MAGNESIUM: Magnesium: 1.8 mg/dL (ref 1.5–2.5)

## 2018-09-08 LAB — PHOSPHORUS: Phosphorus: 2.7 mg/dL (ref 2.5–4.5)

## 2018-09-08 LAB — T4, FREE: Free T4: 1.3 ng/dL (ref 0.8–1.8)

## 2018-09-11 ENCOUNTER — Ambulatory Visit (INDEPENDENT_AMBULATORY_CARE_PROVIDER_SITE_OTHER): Payer: Medicare Other | Admitting: "Endocrinology

## 2018-09-11 ENCOUNTER — Encounter: Payer: Self-pay | Admitting: "Endocrinology

## 2018-09-11 VITALS — BP 112/80 | HR 90 | Ht 64.0 in | Wt 122.0 lb

## 2018-09-11 DIAGNOSIS — M818 Other osteoporosis without current pathological fracture: Secondary | ICD-10-CM | POA: Diagnosis not present

## 2018-09-11 DIAGNOSIS — T380X5A Adverse effect of glucocorticoids and synthetic analogues, initial encounter: Secondary | ICD-10-CM

## 2018-09-11 MED ORDER — ALENDRONATE SODIUM 70 MG PO TABS: 70.0000 mg | ORAL_TABLET | ORAL | 3 refills | Status: DC

## 2018-09-11 NOTE — Progress Notes (Signed)
Endocrinology follow-up note  Subjective:    Patient ID: Kendra Franklin, female    DOB: 23-Feb-1991, PCP Monico Blitz, MD   Past Medical History:  Diagnosis Date  . Allergy    medicine  . Bloody diarrhea 03/11/2015  . Crohn's    DX   DEC 2011--- ASCA 100 pANCA NEG-APR 2012 6-TGN 233(LLN 230)  6-MMPN 1173 ON 75 MG IMURAN  . Crohn's disease, small and large intestine ([place])   . History of anal fissures   . History of anal lesion    2009   NON--HEALING  . History of kidney stones   . History of small intestine ulcer    2011  . Osteoporosis    Past Surgical History:  Procedure Laterality Date  . COLONOSCOPY  DEC 2011   ILEO-COLONIC ULCERS  . COLONOSCOPY N/A 03/16/2015   SLF: 1/ The examined terminal ileum appeared to be normal 2. The left colon is redundatnt 3. Rectal bleeding due to small internal hemorrhoids  . COLONOSCOPY N/A 12/26/2017   Procedure: COLONOSCOPY;  Surgeon: Danie Binder, MD;  [facility];  Service: Endoscopy;  Laterality: N/A;  2:15pm  . CYSTOSCOPY WITH RETROGRADE PYELOGRAM, URETEROSCOPY AND STENT PLACEMENT Left 02/27/2014   Procedure: CYSTOSCOPY WITH RETROGRADE PYELOGRAM, URETEROSCOPY/ STENT PLACEMENT;  Surgeon: Alexis Frock, MD;  Location: [facility];  Service: Urology;  Laterality: Left;  . ESOPHAGOGASTRODUODENOSCOPY N/A 03/16/2015   SLF: 1. epigastric pain most likely due to gastritis/pyschosocial stressors, less likely GERD. 2. Mild non-erosive gastritis.   Marland Kitchen ESOPHAGOGASTRODUODENOSCOPY N/A 12/26/2017   Procedure: ESOPHAGOGASTRODUODENOSCOPY (EGD);  Surgeon: Danie Binder, MD;  [facility];  Service: Endoscopy;  Laterality: N/A;  . FLEXIBLE SIGMOIDOSCOPY N/A 12/11/2013   Procedure: FLEXIBLE SIGMOIDOSCOPY;  Surgeon: Danie Binder, MD;  [facility];  Service: Endoscopy;  Laterality: N/A;  1:45 PM  . GIVENS CAPSULE STUDY  DEC 2011   SB ULCERS  . GIVENS CAPSULE STUDY N/A 01/08/2018   Procedure: GIVENS CAPSULE  STUDY;  Surgeon: Danie Binder, MD;  [facility];  Service: Endoscopy;  Laterality: N/A;  7:30am  . HOLMIUM LASER APPLICATION Left 03/03/2702   Procedure: HOLMIUM LASER APPLICATION;  Surgeon: Alexis Frock, MD;  Location: [facility];  Service: Urology;  Laterality: Left;  . INCISION AND DRAINAGE PERIRECTAL ABSCESS  2009  &  2010   Social History   Socioeconomic History  . Marital status: Single    Spouse name: Not on file  . Number of children: 0  . Years of education: BSW  . Highest education level: Bachelor's degree (e.g., BA, AB, BS)  Occupational History  . Occupation: Ship broker  Social Needs  . Financial resource strain: Not on file  . Food insecurity:    Worry: Not on file    Inability: Not on file  . Transportation needs:    Medical: Not on file    Non-medical: Not on file  Tobacco Use  . Smoking status: Never Smoker  . Smokeless tobacco: Never Used  Substance and Sexual Activity  . Alcohol use: Yes    Comment: RARE  . Drug use: No  . Sexual activity: Not Currently  Lifestyle  . Physical activity:    Days per week: Not on file    Minutes per session: Not on file  . Stress: Not on file  Relationships  . Social connections:    Talks on phone: Not on file    Gets together: Not on  file    Attends religious service: Not on file    Active member of club or organization: Not on file    Attends meetings of clubs or organizations: Not on file    Relationship status: Not on file  Other Topics Concern  . Not on file  Social History Narrative   Graduated from [employer]. Straight A student.    Now at [employer] in Masters of Gerontology program (as of Oct 7096)     Mother: Arlyss Repress. No siblings. Mother smokes.   Lives at home w/ her mother   Right-handed   Caffeine: denies   Outpatient Encounter Medications as of 09/11/2018  Medication Sig  . alendronate (FOSAMAX) 70 MG tablet Take 1 tablet (70 mg total) by mouth every 7 (seven) days. Take  with a full glass of water on an empty stomach.  Marland Kitchen azaTHIOprine (IMURAN) 50 MG tablet Take 2 tablets (100 mg total) by mouth daily.  . drospirenone-ethinyl estradiol (YAZ,GIANVI,LORYNA) 3-0.02 MG tablet Take 1 tablet by mouth daily.  Marland Kitchen inFLIXimab (REMICADE) 100 MG injection Inject 300 mg into the vein every 6 (six) weeks. LAST INJECTION NOV 2017  . metoCLOPramide (REGLAN) 10 MG tablet Take 1 tablet (10 mg total) by mouth every 8 (eight) hours as needed for nausea or vomiting. (Patient not taking: Reported on 08/30/2018)  . ondansetron (ZOFRAN) 4 MG tablet 1 po q6h prn for vomiting  . polyethylene glycol powder (GLYCOLAX/MIRALAX) powder MIX 17 GRAMS (MARKED ON CAP) WITH LIQUID AND DRINK ONCE DAILY AS NEEDED (Patient taking differently: MIX 17 GRAMS (MARKED ON CAP) WITH LIQUID AND DRINK ONCE DAILY AS NEEDED FOR CONTSTIPATION)  . traMADol (ULTRAM) 50 MG tablet Take 1 tablet (50 mg total) by mouth every 6 (six) hours as needed.  . valACYclovir (VALTREX) 1000 MG tablet Take 1,000 mg by mouth daily.  . [DISCONTINUED] alendronate (FOSAMAX) 70 MG tablet Take 1 tablet (70 mg total) by mouth every 7 (seven) days. Take with a full glass of water on an empty stomach. (Patient not taking: Reported on 08/30/2018)   No facility-administered encounter medications on file as of 09/11/2018.    ALLERGIES: Allergies  Allergen Reactions  . Morphine Hives  . Other Rash    Coban causes bruising and rash    VACCINATION STATUS: There is no immunization history for the selected administration types on file for this patient.  HPI  27 year old female patient with medical history as above. She is being seen in f/u for osteoporosis . - She is known to have Crohn's disease currently on therapy azathioprine and Remicade. She follows regularly in gastroenterology with [name]. - She has required some steroid therapy in the past but appears to be very minimal exposure. - She underwent bone density study on 12/07/2016  which showed Z score of -2.9 on Dual Femur and -0.4 on spine. -She was initiated on Fosamax 70 mg p.o. weekly during her last visit in April 2018.  She continues to tolerate this medication with no reported side effects. - She denies personal history of fractures. She denies family history of Crohn's disease nor osteoporosis. - She does not involve in contact sports. - She wishes to resume  Depo-Provera for contraceptive purposes.  -She is taking multivitamins. She denies any history of thyroid/parathyroid dysfunction. - She is currently in masters program for gerontology.  In the interval, she had unexplained weight loss of 20 pounds, however she reports poor appetite made worse by stress/anxiety.   Review of Systems  Constitutional:  +  Weight loss,  + fatigue, no subjective hyperthermia, no subjective hypothermia Eyes: no blurry vision, no xerophthalmia ENT: no sore throat, no nodules palpated in throat, no dysphagia/odynophagia, no hoarseness Cardiovascular: no Chest Pain, no Shortness of Breath, no palpitations, no leg swelling Respiratory: no cough, no SOB Gastrointestinal: no Nausea/Vomiting/Diarhhea Musculoskeletal: no muscle/joint aches Skin: no rashes Neurological: no tremors, no numbness, no tingling, no dizziness Psychiatric: no depression, no anxiety  Objective:    BP 112/80   Pulse 90   Ht 5' 4"  (1.626 m)   Wt 122 lb (55.3 kg)   LMP 08/20/2018   BMI 20.94 kg/m   Wt Readings from Last 3 Encounters:  09/11/18 122 lb (55.3 kg)  08/30/18 119 lb 12.8 oz (54.3 kg)  08/01/18 129 lb 6.6 oz (58.7 kg)    Physical Exam  Constitutional:  + light build, not in acute distress, normal state of mind Eyes: PERRLA, EOMI, no exophthalmos ENT: moist mucous membranes, no thyromegaly, no cervical lymphadenopathy Cardiovascular: normal precordial activity, Regular Rate and Rhythm, no Murmur/Rubs/Gallops Respiratory:  adequate breathing efforts, no gross chest deformity, Clear to  auscultation bilaterally Gastrointestinal: abdomen soft, Non -tender, No distension, Bowel Sounds present Musculoskeletal: no gross deformities, strength intact in all four extremities Skin: moist, warm, no rashes Neurological: no tremor with outstretched hands, Deep tendon reflexes normal in all four extremities.  CMP ( most recent) CMP     Component Value Date/Time   NA 140 06/01/2018 1245   K 4.0 06/01/2018 1245   CL 107 06/01/2018 1245   CO2 23 06/01/2018 1245   GLUCOSE 88 06/01/2018 1245   BUN 10 06/01/2018 1245   CREATININE 0.73 06/01/2018 1245   CREATININE 0.68 12/01/2016 0959   CALCIUM 9.6 09/05/2018 1017   PROT 8.4 (H) 06/01/2018 1245   ALBUMIN 4.3 06/01/2018 1245   AST 18 06/01/2018 1245   ALT 10 06/01/2018 1245   ALKPHOS 45 06/01/2018 1245   BILITOT 0.8 06/01/2018 1245   GFRNONAA >60 06/01/2018 1245   GFRNONAA >89 12/01/2016 0959   GFRAA >60 06/01/2018 1245   GFRAA >89 12/01/2016 0959    Recent Results (from the past 2160 hour(s))  Procollagen Type I Intact N Terminal Propeptide     Status: None   Collection Time: 09/05/18 10:17 AM  Result Value Ref Range   Procollagen Type I Intact N Terminal Propeptide 92 22 - 104 mcg/L  C-Terminal Telopeptide     Status: None   Collection Time: 09/05/18 10:17 AM  Result Value Ref Range   C-Telopeptide (CTx) 356 64 - 640 pg/mL    Comment: . No reference range is provided for postmenopausal women because of the increased rate of bone turnover post-menopause. It is recommended that results for postmenopausal women be compared to the premenopausal reference range as this will give a better indication of their rate of bone loss. Marland Kitchen   VITAMIN D 25 Hydroxy (Vit-D Deficiency, Fractures)     Status: None   Collection Time: 09/05/18 10:17 AM  Result Value Ref Range   Vit D, 25-Hydroxy 50 30 - 100 ng/mL    Comment: Vitamin D Status         25-OH Vitamin D: . Deficiency:                    <20 ng/mL Insufficiency:              20 - 29 ng/mL Optimal:                 >  or = 30 ng/mL . For 25-OH Vitamin D testing on patients on  D2-supplementation and patients for whom quantitation  of D2 and D3 fractions is required, the QuestAssureD(TM) 25-OH VIT D, (D2,D3), LC/MS/MS is recommended: order  code [phone] (patients >36yr). . For more information on this test, go to: http://education.questdiagnostics.com/faq/FAQ163 (This link is being provided for  informational/educational purposes only.)   T4, free     Status: None   Collection Time: 09/05/18 10:17 AM  Result Value Ref Range   Free T4 1.3 0.8 - 1.8 ng/dL  PTH, intact and calcium     Status: None   Collection Time: 09/05/18 10:17 AM  Result Value Ref Range   PTH 26 14 - 64 pg/mL    Comment: . Interpretive Guide    Intact PTH           Calcium ------------------    ----------           ------- Normal Parathyroid    Normal               Normal Hypoparathyroidism    Low or Low Normal    Low Hyperparathyroidism    Primary            Normal or High       High    Secondary          High                 Normal or Low    Tertiary           High                 High Non-Parathyroid    Hypercalcemia      Low or Low Normal    High .    Calcium 9.6 8.6 - 10.2 mg/dL  Phosphorus     Status: None   Collection Time: 09/05/18 10:17 AM  Result Value Ref Range   Phosphorus 2.7 2.5 - 4.5 mg/dL  Magnesium     Status: None   Collection Time: 09/05/18 10:17 AM  Result Value Ref Range   Magnesium 1.8 1.5 - 2.5 mg/dL    Bone densitometry from 12/07/2016 is reviewed.  Assessment & Plan:   1. Osteoporosis -She continues to tolerate Fosamax 70 mg p.o. Weekly. -Her serum CTX and P1 NP-stable and within normal limits.  - She has multiple risk factors for osteoporosis including Crohn's disease. - She does not give history significant for heavy steroid exposure. -  Her workup for common secondary cause of osteoporosis are negative including  hyperparathyroidism/hyperthyroidism .  - As of osteoporosis in premenopausal woman is not diagnosed based on bone densitometry alone, Given the significantly decreased bone mass on bilateral hips with Z score of -2.9 and active Crohn's disease , she is at risk for fractures. - It is not clear if she ever achieved optimal bone mass since the bone density in January 2018 was her first bone study.   -Considering all the circumstances, she would continue to benefit from bisphosphonate therapy with adequate supplements of calcium and vitamin D.   - I discussed with her pathology and therapeutic options of osteoporosis. She agrees with my plan to continue  alendronate 70 mg by mouth weekly. Side effects and precautions discussed with her. - She will also be continued on a regular vitamin D 3 5000 units daily. - She will have her repeat bone density in January 2020 to assess effect of therapy. -  She is  advised to avoid strenuous exercise including contact sports however would benefit from weight bearing exercises including resistance cord, swimming, walking, and aerobics. - This measure is important to decrease her risk of fractures.  Regarding her contraceptive choices: Depo use is known to cause reversible decrease in BMD, however unlikely to increase her risk of fracture.   Contraceptive implant would have been a better choice for her, however she prefers the Depo.  She will seek prescription from her OB/GYN provider.  For this reason, she will need to be in close follow-up with repeat bone density in 1 year.  - I advised patient to maintain close follow up with her PCP primary care needs. Follow up plan: Return in about 1 year (around 09/12/2019) for Follow up with Bone Density.  Glade Lloyd, MD Phone: [phone]  Fax: [phone]   09/11/2018, 1:16 PM

## 2018-09-12 ENCOUNTER — Encounter (HOSPITAL_COMMUNITY)
Admission: RE | Admit: 2018-09-12 | Discharge: 2018-09-12 | Disposition: A | Discharge status: Home / Self Care | Payer: Medicare Other | Source: Ambulatory Visit | Attending: Gastroenterology | Admitting: Gastroenterology

## 2018-09-12 DIAGNOSIS — K5090 Crohn's disease, unspecified, without complications: Secondary | ICD-10-CM | POA: Insufficient documentation

## 2018-09-12 DIAGNOSIS — M818 Other osteoporosis without current pathological fracture: Secondary | ICD-10-CM | POA: Diagnosis not present

## 2018-09-12 DIAGNOSIS — T380X5A Adverse effect of glucocorticoids and synthetic analogues, initial encounter: Secondary | ICD-10-CM | POA: Diagnosis not present

## 2018-09-12 MED ORDER — ACETAMINOPHEN 325 MG PO TABS
650.0000 mg | ORAL_TABLET | Freq: Once | ORAL | Status: AC
  Administered 2018-09-12: 650 mg via ORAL
  Filled 2018-09-12: qty 2

## 2018-09-12 MED ORDER — SODIUM CHLORIDE 0.9 % IV SOLN
300.0000 mg | Freq: Once | INTRAVENOUS | Status: AC
  Administered 2018-09-12: 300 mg via INTRAVENOUS
  Filled 2018-09-12: qty 30

## 2018-09-12 MED ORDER — LORATADINE 10 MG PO TABS
10.0000 mg | ORAL_TABLET | Freq: Once | ORAL | Status: AC
  Administered 2018-09-12: 10 mg via ORAL
  Filled 2018-09-12: qty 1

## 2018-09-12 MED ORDER — SODIUM CHLORIDE 0.9 % IV SOLN
Freq: Once | INTRAVENOUS | Status: AC
  Administered 2018-09-12: 09:00:00 via INTRAVENOUS

## 2018-09-13 DIAGNOSIS — Z3042 Encounter for surveillance of injectable contraceptive: Secondary | ICD-10-CM | POA: Diagnosis not present

## 2018-09-14 LAB — CORTISOL-AM, BLOOD: Cortisol - AM: 24.2 ug/dL — ABNORMAL HIGH

## 2018-10-05 ENCOUNTER — Telehealth: Payer: Self-pay | Admitting: "Endocrinology

## 2018-10-05 NOTE — Telephone Encounter (Signed)
Kendra Franklin is calling requesting lab results, please advise?

## 2018-10-05 NOTE — Telephone Encounter (Signed)
Her labs are stable, nothing concerning for now.

## 2018-10-08 NOTE — Telephone Encounter (Signed)
Pt.notified

## 2018-10-24 ENCOUNTER — Encounter (HOSPITAL_COMMUNITY)
Admission: RE | Admit: 2018-10-24 | Discharge: 2018-10-24 | Disposition: A | Discharge status: Home / Self Care | Payer: Medicare Other | Source: Ambulatory Visit | Attending: Gastroenterology | Admitting: Gastroenterology

## 2018-10-24 ENCOUNTER — Encounter (HOSPITAL_COMMUNITY): Payer: Self-pay

## 2018-10-24 DIAGNOSIS — K5090 Crohn's disease, unspecified, without complications: Secondary | ICD-10-CM | POA: Insufficient documentation

## 2018-10-24 MED ORDER — SODIUM CHLORIDE 0.9 % IV SOLN
300.0000 mg | Freq: Once | INTRAVENOUS | Status: AC
  Administered 2018-10-24: 300 mg via INTRAVENOUS
  Filled 2018-10-24: qty 30

## 2018-10-24 MED ORDER — SODIUM CHLORIDE 0.9 % IV SOLN
Freq: Once | INTRAVENOUS | Status: AC
  Administered 2018-10-24: 09:00:00 via INTRAVENOUS

## 2018-11-07 ENCOUNTER — Ambulatory Visit: Payer: Medicare Other | Admitting: Gastroenterology

## 2018-11-14 DIAGNOSIS — Z2821 Immunization not carried out because of patient refusal: Secondary | ICD-10-CM | POA: Diagnosis not present

## 2018-11-14 DIAGNOSIS — Z6820 Body mass index (BMI) 20.0-20.9, adult: Secondary | ICD-10-CM | POA: Diagnosis not present

## 2018-11-14 DIAGNOSIS — K5090 Crohn's disease, unspecified, without complications: Secondary | ICD-10-CM | POA: Diagnosis not present

## 2018-11-14 DIAGNOSIS — Z789 Other specified health status: Secondary | ICD-10-CM | POA: Diagnosis not present

## 2018-11-14 DIAGNOSIS — Z299 Encounter for prophylactic measures, unspecified: Secondary | ICD-10-CM | POA: Diagnosis not present

## 2018-11-26 DIAGNOSIS — N898 Other specified noninflammatory disorders of vagina: Secondary | ICD-10-CM | POA: Diagnosis not present

## 2018-12-05 ENCOUNTER — Encounter (HOSPITAL_COMMUNITY): Payer: Self-pay

## 2018-12-05 ENCOUNTER — Encounter (HOSPITAL_COMMUNITY)
Admission: RE | Admit: 2018-12-05 | Discharge: 2018-12-05 | Disposition: A | Discharge status: Home / Self Care | Payer: Medicare Other | Source: Ambulatory Visit | Attending: Gastroenterology | Admitting: Gastroenterology

## 2018-12-05 ENCOUNTER — Telehealth: Payer: Self-pay | Admitting: Gastroenterology

## 2018-12-05 DIAGNOSIS — K5090 Crohn's disease, unspecified, without complications: Secondary | ICD-10-CM | POA: Insufficient documentation

## 2018-12-05 MED ORDER — ACETAMINOPHEN 325 MG PO TABS
650.0000 mg | ORAL_TABLET | Freq: Once | ORAL | Status: AC
  Administered 2018-12-05: 650 mg via ORAL
  Filled 2018-12-05: qty 2

## 2018-12-05 MED ORDER — SODIUM CHLORIDE 0.9 % IV SOLN
Freq: Once | INTRAVENOUS | Status: AC
  Administered 2018-12-05: 09:00:00 via INTRAVENOUS

## 2018-12-05 MED ORDER — SODIUM CHLORIDE 0.9 % IV SOLN
300.0000 mg | Freq: Once | INTRAVENOUS | Status: AC
  Administered 2018-12-05: 300 mg via INTRAVENOUS
  Filled 2018-12-05: qty 30

## 2018-12-05 MED ORDER — LORATADINE 10 MG PO TABS
10.0000 mg | ORAL_TABLET | Freq: Once | ORAL | Status: AC
  Administered 2018-12-05: 10 mg via ORAL
  Filled 2018-12-05: qty 1

## 2018-12-05 NOTE — Telephone Encounter (Signed)
Kendra Franklin is not here today.  I called and spoke with Kendra Franklin. Patient received a one time dose of Fentanyl in May 2019 per [name] at time of infusion due to rectal/abdominal pain. Per Kendra Franklin, patient is in no distress, talking on phone, no discomfort. Discussed with Kendra Franklin that we would not be ordering and if patient brings this up again, will let her know it was a one time dose for an acute issue.

## 2018-12-05 NOTE — Telephone Encounter (Signed)
Tomi from the Clinic at [facility] called asking for SF. I told her SF was out this week. She said the patient was there now to get her Remicaid and needed an order for Fentynel 68m and needed one of the extenders to call her back. [phone] (I am forwarding this to LSL, AB and EG)

## 2018-12-05 NOTE — Telephone Encounter (Signed)
Kendra Franklin, please look into this. I usually don't order Fentanyl. Consider having endo discuss with RMR>

## 2018-12-10 DIAGNOSIS — Z30013 Encounter for initial prescription of injectable contraceptive: Secondary | ICD-10-CM | POA: Diagnosis not present

## 2018-12-10 DIAGNOSIS — Z3042 Encounter for surveillance of injectable contraceptive: Secondary | ICD-10-CM | POA: Diagnosis not present

## 2018-12-12 ENCOUNTER — Encounter: Payer: Self-pay | Admitting: Gastroenterology

## 2018-12-24 ENCOUNTER — Telehealth: Payer: Self-pay

## 2018-12-24 DIAGNOSIS — K50913 Crohn's disease, unspecified, with fistula: Secondary | ICD-10-CM

## 2018-12-24 NOTE — Telephone Encounter (Signed)
Received fax from [facility] in [place]. Pharmacy needs diagnosis code on hard copy for insurance for Azathioprine prescription.

## 2018-12-25 NOTE — Telephone Encounter (Signed)
She needs a new hardcopy with diagnosis code sent to pharmacy.

## 2018-12-25 NOTE — Telephone Encounter (Signed)
Crohn's disease with fistula. ICD-10: G94.834

## 2018-12-26 MED ORDER — AZATHIOPRINE 50 MG PO TABS: 100.0000 mg | ORAL_TABLET | Freq: Every day | ORAL | 5 refills | Status: DC

## 2018-12-26 NOTE — Telephone Encounter (Signed)
Rx faxed to pharmacy  

## 2018-12-26 NOTE — Telephone Encounter (Signed)
Printed to fax to pharmacy

## 2019-01-18 ENCOUNTER — Encounter (HOSPITAL_COMMUNITY): Payer: Self-pay

## 2019-01-18 ENCOUNTER — Encounter (HOSPITAL_COMMUNITY)
Admission: RE | Admit: 2019-01-18 | Discharge: 2019-01-18 | Disposition: A | Discharge status: Home / Self Care | Payer: Medicare Other | Source: Ambulatory Visit | Attending: Gastroenterology | Admitting: Gastroenterology

## 2019-01-18 DIAGNOSIS — K5090 Crohn's disease, unspecified, without complications: Secondary | ICD-10-CM | POA: Diagnosis not present

## 2019-01-18 MED ORDER — ACETAMINOPHEN 325 MG PO TABS
650.0000 mg | ORAL_TABLET | Freq: Once | ORAL | Status: AC
  Administered 2019-01-18: 650 mg via ORAL
  Filled 2019-01-18: qty 2

## 2019-01-18 MED ORDER — LORATADINE 10 MG PO TABS
10.0000 mg | ORAL_TABLET | Freq: Once | ORAL | Status: AC
  Administered 2019-01-18: 10 mg via ORAL
  Filled 2019-01-18: qty 1

## 2019-01-18 MED ORDER — HYDROCODONE-ACETAMINOPHEN 5-325 MG PO TABS
1.0000 | ORAL_TABLET | Freq: Once | ORAL | Status: AC
  Administered 2019-01-18: 1 via ORAL

## 2019-01-18 MED ORDER — SODIUM CHLORIDE 0.9 % IV SOLN
300.0000 mg | Freq: Once | INTRAVENOUS | Status: AC
  Administered 2019-01-18: 300 mg via INTRAVENOUS
  Filled 2019-01-18: qty 30

## 2019-01-18 MED ORDER — SODIUM CHLORIDE 0.9 % IV SOLN
Freq: Once | INTRAVENOUS | Status: AC
  Administered 2019-01-18: 09:00:00 via INTRAVENOUS

## 2019-01-18 MED ORDER — HYDROCODONE-ACETAMINOPHEN 5-325 MG PO TABS
ORAL_TABLET | ORAL | Status: AC
  Filled 2019-01-18: qty 1

## 2019-01-29 ENCOUNTER — Telehealth: Payer: Self-pay | Admitting: Gastroenterology

## 2019-01-29 DIAGNOSIS — Z681 Body mass index (BMI) 19 or less, adult: Secondary | ICD-10-CM | POA: Diagnosis not present

## 2019-01-29 DIAGNOSIS — Z789 Other specified health status: Secondary | ICD-10-CM | POA: Diagnosis not present

## 2019-01-29 DIAGNOSIS — Z299 Encounter for prophylactic measures, unspecified: Secondary | ICD-10-CM | POA: Diagnosis not present

## 2019-01-29 DIAGNOSIS — K5090 Crohn's disease, unspecified, without complications: Secondary | ICD-10-CM | POA: Diagnosis not present

## 2019-01-29 NOTE — Telephone Encounter (Signed)
I spoke with [name] and she stated the patient needs to be evaluated at ER to see if she has a flare-up from her Crohn's Disease.

## 2019-01-29 NOTE — Telephone Encounter (Signed)
[phone]  Please call Lovena Le the nurse practitioner at [facility] internal regarding patient

## 2019-01-29 NOTE — Telephone Encounter (Signed)
I spoke to [place] and she said pt was having a flare of her Crohn's.  She said pt had been seen by endocrinology and was advised not to have much steroids due to her osteoporosis at her young age.   She said she gave her Depomedrol 80 mg and Torodol 60 mg and pt was feeling much better in just a short time.  Pt has appt to see Walden Field, NP tomorrow, 01/30/2019 @ 9:00 AM.

## 2019-01-30 ENCOUNTER — Other Ambulatory Visit: Payer: Self-pay

## 2019-01-30 ENCOUNTER — Encounter (HOSPITAL_COMMUNITY): Payer: Medicare Other

## 2019-01-30 ENCOUNTER — Ambulatory Visit: Payer: Medicare Other | Admitting: Nurse Practitioner

## 2019-01-30 ENCOUNTER — Ambulatory Visit (INDEPENDENT_AMBULATORY_CARE_PROVIDER_SITE_OTHER): Payer: Medicare Other | Admitting: Gastroenterology

## 2019-01-30 ENCOUNTER — Telehealth: Payer: Self-pay | Admitting: Gastroenterology

## 2019-01-30 ENCOUNTER — Encounter: Payer: Self-pay | Admitting: Gastroenterology

## 2019-01-30 VITALS — BP 111/70 | HR 99 | Temp 98.9°F | Ht 64.0 in | Wt 122.6 lb

## 2019-01-30 DIAGNOSIS — K5090 Crohn's disease, unspecified, without complications: Secondary | ICD-10-CM

## 2019-01-30 DIAGNOSIS — R634 Abnormal weight loss: Secondary | ICD-10-CM | POA: Diagnosis not present

## 2019-01-30 DIAGNOSIS — R110 Nausea: Secondary | ICD-10-CM

## 2019-01-30 MED ORDER — PROMETHAZINE HCL 6.25 MG/5ML PO SYRP: 6.2500 mg | ORAL_SOLUTION | Freq: Four times a day (QID) | ORAL | 3 refills | Status: DC | PRN

## 2019-01-30 MED ORDER — ONDANSETRON HCL 4 MG PO TABS: 4.0000 mg | ORAL_TABLET | Freq: Three times a day (TID) | ORAL | 3 refills | Status: DC | PRN

## 2019-01-30 NOTE — Progress Notes (Signed)
Referring Provider: Monico Blitz, MD Primary Care Physician:  Monico Blitz, MD  Primary GI: [name]   Chief Complaint  Patient presents with  . Abdominal Pain    x friday  . Nausea    w/ dry heaves  . Diarrhea    4 TIMES yesterday    HPI:   Mannat Benedetti Hinesley is returning here in follow-up with history of small bowel Crohn's disease, diagnosed in 2011 at age 28, osteoporosis in setting of Crohn's and prior steroid therapy. She was seen at [facility] Aug 2019 and advised Remicade trough and antibody level through [facility] labs, continue Remicade every 6 weeks, continue azathioprine 100 mg daily,   CT enterography Aug 2019 at [facility]: Limited evaluation due to paucity of intra-abdominal fat and under distention of bowel loops. 2. Apparent focal wall thickening and mild enhancement in a short segment of mid ileum (series 2, image 131) . Findings may represent a focal area of active inflammation, stricture, or simply decompressed bowel and evaluation is limited by bowel decompression. No evidence of perforation or abscess.  3. Angulation of bowel loops in the lower abdomen suggests the presence of adhesions. No evidence of obstruction.  Liver also with multiple low-attenuation lesions, likely representing hepatic cysts.  Dr. Nyoka Cowden felt that findings in small bowel most related to scar tissue.   Presents today stating she woke with sharp pain on Friday at 2 am. Monday morning had diarrhea and dry heaves. Diarrhea now resolved. Phenergan makes her sleepy, even at 12.5 mg. BM once daily now, formed. Crouching down on all 4's helps relieve discomfort. Not taking Zofran. Next Remicade dose is 4/3. She is not taking Fosamax that was prescribed by Dr. Dorris Fetch. Due for DEXA scan.   Past Medical History:  Diagnosis Date  . Allergy    medicine  . Bloody diarrhea 03/11/2015  . Crohn's    DX   DEC 2011--- ASCA 100 pANCA NEG-APR 2012 6-TGN 233(LLN 230)  6-MMPN 1173 ON 75 MG IMURAN  . Crohn's  disease, small and large intestine ([place])   . History of anal fissures   . History of anal lesion    2009   NON--HEALING  . History of kidney stones   . History of small intestine ulcer    2011  . Osteoporosis     Past Surgical History:  Procedure Laterality Date  . COLONOSCOPY  DEC 2011   ILEO-COLONIC ULCERS  . COLONOSCOPY N/A 03/16/2015   SLF: 1/ The examined terminal ileum appeared to be normal 2. The left colon is redundatnt 3. Rectal bleeding due to small internal hemorrhoids  . COLONOSCOPY N/A 12/26/2017   Procedure: COLONOSCOPY;  Surgeon: Danie Binder, MD;  [facility];  Service: Endoscopy;  Laterality: N/A;  2:15pm  . CYSTOSCOPY WITH RETROGRADE PYELOGRAM, URETEROSCOPY AND STENT PLACEMENT Left 02/27/2014   Procedure: CYSTOSCOPY WITH RETROGRADE PYELOGRAM, URETEROSCOPY/ STENT PLACEMENT;  Surgeon: Alexis Frock, MD;  Location: [facility];  Service: Urology;  Laterality: Left;  . ESOPHAGOGASTRODUODENOSCOPY N/A 03/16/2015   SLF: 1. epigastric pain most likely due to gastritis/pyschosocial stressors, less likely GERD. 2. Mild non-erosive gastritis.   Marland Kitchen ESOPHAGOGASTRODUODENOSCOPY N/A 12/26/2017   Procedure: ESOPHAGOGASTRODUODENOSCOPY (EGD);  Surgeon: Danie Binder, MD;  [facility];  Service: Endoscopy;  Laterality: N/A;  . FLEXIBLE SIGMOIDOSCOPY N/A 12/11/2013   Procedure: FLEXIBLE SIGMOIDOSCOPY;  Surgeon: Danie Binder, MD;  [facility];  Service: Endoscopy;  Laterality: N/A;  1:45  PM  . GIVENS CAPSULE STUDY  DEC 2011   SB ULCERS  . GIVENS CAPSULE STUDY N/A 01/08/2018   Procedure: GIVENS CAPSULE STUDY;  Surgeon: Danie Binder, MD;  [facility];  Service: Endoscopy;  Laterality: N/A;  7:30am  . HOLMIUM LASER APPLICATION Left 12/04/735   Procedure: HOLMIUM LASER APPLICATION;  Surgeon: Alexis Frock, MD;  Location: [facility];  Service: Urology;  Laterality: Left;  . INCISION AND DRAINAGE PERIRECTAL ABSCESS   2009  &  2010    Current Outpatient Medications  Medication Sig Dispense Refill  . azaTHIOprine (IMURAN) 50 MG tablet Take 2 tablets (100 mg total) by mouth daily. 60 tablet 5  . inFLIXimab (REMICADE) 100 MG injection Inject 300 mg into the vein every 6 (six) weeks. LAST INJECTION NOV 2017    . metoCLOPramide (REGLAN) 10 MG tablet Take 1 tablet (10 mg total) by mouth every 8 (eight) hours as needed for nausea or vomiting. 30 tablet 0  . ondansetron (ZOFRAN) 4 MG tablet 1 po q6h prn for vomiting 20 tablet 0  . polyethylene glycol powder (GLYCOLAX/MIRALAX) powder MIX 17 GRAMS (MARKED ON CAP) WITH LIQUID AND DRINK ONCE DAILY AS NEEDED (Patient taking differently: MIX 17 GRAMS (MARKED ON CAP) WITH LIQUID AND DRINK ONCE DAILY AS NEEDED FOR CONTSTIPATION) 527 g 5  . traMADol (ULTRAM) 50 MG tablet Take 1 tablet (50 mg total) by mouth every 6 (six) hours as needed. 30 tablet 0  . valACYclovir (VALTREX) 1000 MG tablet Take 1,000 mg by mouth daily.    Marland Kitchen alendronate (FOSAMAX) 70 MG tablet Take 1 tablet (70 mg total) by mouth every 7 (seven) days. Take with a full glass of water on an empty stomach. (Patient not taking: Reported on 01/30/2019) 12 tablet 3  . drospirenone-ethinyl estradiol (YAZ,GIANVI,LORYNA) 3-0.02 MG tablet Take 1 tablet by mouth daily.     No current facility-administered medications for this visit.     Allergies as of 01/30/2019 - Review Complete 01/30/2019  Allergen Reaction Noted  . Morphine Hives   . Other Rash 06/20/2018    Family History  Problem Relation Age of Onset  . Diabetes Maternal Grandmother   . Colon cancer Neg Hx   . Colon polyps Neg Hx   . Inflammatory bowel disease Neg Hx     Social History   Socioeconomic History  . Marital status: Single    Spouse name: Not on file  . Number of children: 0  . Years of education: BSW  . Highest education level: Bachelor's degree (e.g., BA, AB, BS)  Occupational History  . Occupation: Ship broker  Social Needs  .  Financial resource strain: Not on file  . Food insecurity:    Worry: Not on file    Inability: Not on file  . Transportation needs:    Medical: Not on file    Non-medical: Not on file  Tobacco Use  . Smoking status: Never Smoker  . Smokeless tobacco: Never Used  Substance and Sexual Activity  . Alcohol use: Yes    Comment: RARE  . Drug use: No  . Sexual activity: Not Currently  Lifestyle  . Physical activity:    Days per week: Not on file    Minutes per session: Not on file  . Stress: Not on file  Relationships  . Social connections:    Talks on phone: Not on file    Gets together: Not on file    Attends religious service: Not on file  Active member of club or organization: Not on file    Attends meetings of clubs or organizations: Not on file    Relationship status: Not on file  Other Topics Concern  . Not on file  Social History Narrative   Graduated from [employer]. Straight A student.    Now at [employer] in Masters of Gerontology program (as of Oct 4650)     Mother: Arlyss Repress. No siblings. Mother smokes.   Lives at home w/ her mother   Right-handed   Caffeine: denies    Review of Systems: Gen: Denies fever, chills, anorexia. Denies fatigue, weakness, weight loss.  CV: Denies chest pain, palpitations, syncope, peripheral edema, and claudication. Resp: Denies dyspnea at rest, cough, wheezing, coughing up blood, and pleurisy. GI: see HPI Derm: Denies rash, itching, dry skin Psych: Denies depression, anxiety, memory loss, confusion. No homicidal or suicidal ideation.  Heme: Denies bruising, bleeding, and enlarged lymph nodes.  Physical Exam: BP 111/70   Pulse 99   Temp 98.9 F (37.2 C) (Oral)   Ht 5' 4"  (1.626 m)   Wt 122 lb 9.6 oz (55.6 kg)   BMI 21.04 kg/m  General:   Alert and oriented. No distress noted. Does not appear to be in acute pain.  Head:  Normocephalic and atraumatic. Eyes:  Conjuctiva clear without scleral icterus. Mouth:  Oral mucosa pink and  moist.  Abdomen:  +BS, soft, non-tender and non-distended. No rebound or guarding. No HSM or masses noted. Msk:  Symmetrical without gross deformities. Normal posture. Extremities:  Without edema. Neurologic:  Alert and  oriented x4 Psych:  Alert and cooperative. Normal mood and affect.

## 2019-01-30 NOTE — Telephone Encounter (Signed)
Please let patient know I spoke with [name]. We need to check thioropurine metabolites: is this a send-out lab?   I also discussed possibility of marinol for patient with [name]: this is not indicated at this time.  Take Zofran and phenergan prn. I am requesting Remicade labs from [facility].

## 2019-01-30 NOTE — Patient Instructions (Signed)
Attempted to submit PA for bone density via [facility]. Per [employer] website: NOTE: This [facility] member does not require prior authorization for OUTPATIENT Radiology through [place] or [place] [facility] at this time.

## 2019-01-30 NOTE — Progress Notes (Unsigned)
Dg b

## 2019-01-30 NOTE — Patient Instructions (Signed)
Please have blood work done today. I am requesting blood work from [place] that checked your Remicade drug levels. I am talking to [name] about a different agent for therapy.  I sent in Zofran to take every 8 hours as needed for nausea. I have sent in phenergan syrup which will be 5 milliliters every 6-8 hours. This is 6.25 mg, so it is lower than anything you have tried. Monitor for drowsiness, do not drive while taking.  You will see [name] again in 3 months. In the meantime, I have also ordered a bone density scan. It is important you take Fosamax weekly for now until we find out more about bone density.  It was a pleasure to see you today. I strive to create trusting relationships with patients to provide genuine, compassionate, and quality care. I value your feedback. If you receive a survey regarding your visit,  I greatly appreciate you taking time to fill this out.   Annitta Needs, PhD, ANP-BC [facility]

## 2019-01-31 LAB — COMPLETE METABOLIC PANEL WITH GFR
AG RATIO: 1.4 (calc) (ref 1.0–2.5)
ALT: 8 U/L (ref 6–29)
AST: 18 U/L (ref 10–30)
Albumin: 4.5 g/dL (ref 3.6–5.1)
Alkaline phosphatase (APISO): 49 U/L (ref 31–125)
BUN: 9 mg/dL (ref 7–25)
CO2: 23 mmol/L (ref 20–32)
Calcium: 9.8 mg/dL (ref 8.6–10.2)
Chloride: 109 mmol/L (ref 98–110)
Creat: 0.78 mg/dL (ref 0.50–1.10)
GFR, Est African American: 122 mL/min/{1.73_m2} (ref >=60)
GFR, Est Non African American: 105 mL/min/{1.73_m2} (ref >=60)
Globulin: 3.3 g/dL (calc) (ref 1.9–3.7)
Glucose, Bld: 88 mg/dL (ref 65–139)
Potassium: 3.9 mmol/L (ref 3.5–5.3)
Sodium: 141 mmol/L (ref 135–146)
Total Bilirubin: 0.8 mg/dL (ref 0.2–1.2)
Total Protein: 7.8 g/dL (ref 6.1–8.1)

## 2019-01-31 LAB — CBC WITH DIFFERENTIAL/PLATELET
Absolute Monocytes: 328 cells/uL (ref 200–950)
BASOS ABS: 20 {cells}/uL (ref 0–200)
Basophils Relative: 0.4 %
Eosinophils Absolute: 49 cells/uL (ref 15–500)
Eosinophils Relative: 1 %
HCT: 38.7 % (ref 35.0–45.0)
Hemoglobin: 13.5 g/dL (ref 11.7–15.5)
Lymphs Abs: 1127 cells/uL (ref 850–3900)
MCH: 33 pg (ref 27.0–33.0)
MCHC: 34.9 g/dL (ref 32.0–36.0)
MCV: 94.6 fL (ref 80.0–100.0)
MPV: 11.1 fL (ref 7.5–12.5)
Monocytes Relative: 6.7 %
Neutro Abs: 3376 cells/uL (ref 1500–7800)
Neutrophils Relative %: 68.9 %
Platelets: 248 10*3/uL (ref 140–400)
RBC: 4.09 10*6/uL (ref 3.80–5.10)
RDW: 12.4 % (ref 11.0–15.0)
Total Lymphocyte: 23 %
WBC: 4.9 10*3/uL (ref 3.8–10.8)

## 2019-01-31 LAB — SEDIMENTATION RATE: SED RATE: 17 mm/h (ref 0–20)

## 2019-01-31 LAB — C-REACTIVE PROTEIN: CRP: 0.4 mg/L (ref <8.0)

## 2019-01-31 NOTE — Telephone Encounter (Signed)
Seen by AB in the office. See office visit notes for details.

## 2019-01-31 NOTE — Telephone Encounter (Signed)
LMOM for a return call. Pt will need to come by office and pick up kit for the lab work and take to [facility]. That is the only lab drawing these [facility] labs.  I'm leaving kit at front for pick up.

## 2019-01-31 NOTE — Telephone Encounter (Signed)
[facility] paper work on [employer].

## 2019-01-31 NOTE — Progress Notes (Signed)
Kendra Franklin, pt is aware of the results and plan. ( See separate note, she will pick up kit to do the lab work). She did not have a message in [place].

## 2019-01-31 NOTE — Telephone Encounter (Signed)
Completed.

## 2019-01-31 NOTE — Progress Notes (Signed)
Kendra Franklin: your labs look great! Inflammatory markers are normal. No anemia or abnormal white blood count. We will check your Imuran metabolites to make sure dosage is appropriate. Continue Remicade for now. Sent in [place]

## 2019-01-31 NOTE — Telephone Encounter (Signed)
PT is aware to pick up kit here and take to [facility] lab. She will look in [place] for the info from [place].

## 2019-02-01 ENCOUNTER — Encounter (HOSPITAL_COMMUNITY): Payer: Medicare Other

## 2019-02-01 NOTE — Assessment & Plan Note (Signed)
Improved

## 2019-02-01 NOTE — Assessment & Plan Note (Signed)
Currently on Remicade and Imuran. Chronic abdominal pain. No acute findings on exam today, and patient is quite pleasant in no distress. Checking CRP, sed rate, CBC, CMP. Request Remicade send out labs from [facility]. Will also order TPMT metabolites. DEXA scan ordered, and she was reminded to take Fosamax weekly as previously prescribed by endocrinology.

## 2019-02-01 NOTE — Assessment & Plan Note (Signed)
Chronic. Discussed with Dr. Raina Mina but will hold off on that at this time. Resume Zofran as needed. Phenergan syrup sent in for 6.25 mg every 8 hours as needed. Return in 3 months.

## 2019-02-04 ENCOUNTER — Other Ambulatory Visit (HOSPITAL_COMMUNITY)
Admission: RE | Admit: 2019-02-04 | Discharge: 2019-02-04 | Disposition: A | Discharge status: Home / Self Care | Payer: Medicare Other | Source: Ambulatory Visit | Attending: Gastroenterology | Admitting: Gastroenterology

## 2019-02-04 ENCOUNTER — Ambulatory Visit (HOSPITAL_COMMUNITY)
Admission: RE | Admit: 2019-02-04 | Discharge: 2019-02-04 | Disposition: A | Discharge status: Home / Self Care | Payer: Medicare Other | Source: Ambulatory Visit | Attending: Gastroenterology | Admitting: Gastroenterology

## 2019-02-04 DIAGNOSIS — K5090 Crohn's disease, unspecified, without complications: Secondary | ICD-10-CM | POA: Diagnosis not present

## 2019-02-04 DIAGNOSIS — M810 Age-related osteoporosis without current pathological fracture: Secondary | ICD-10-CM | POA: Diagnosis not present

## 2019-02-04 DIAGNOSIS — M8588 Other specified disorders of bone density and structure, other site: Secondary | ICD-10-CM | POA: Insufficient documentation

## 2019-02-04 NOTE — Progress Notes (Signed)
CC'D TO PCP °

## 2019-02-05 ENCOUNTER — Telehealth: Payer: Self-pay | Admitting: Gastroenterology

## 2019-02-05 DIAGNOSIS — K5090 Crohn's disease, unspecified, without complications: Secondary | ICD-10-CM

## 2019-02-05 NOTE — Telephone Encounter (Signed)
CT scheduled for 3/13 at 12:00pm, arrive at 11:00am, npo 4 hrs prior. Patient aware of appt details. Aware she needs to have preg UA done prior

## 2019-02-05 NOTE — Telephone Encounter (Signed)
[facility] clinical pool:  [name] is requesting a CTE within 7 days to assess for disease activity, stricture, or fistula. 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 Will need urine pregnancy prior to CT. Order is placed in epic. Please arrange. Thanks!

## 2019-02-05 NOTE — Progress Notes (Addendum)
Saw pt yesterday when drawing blood. 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  IF ACTIVE DISEASE AND IMURAN METABOLITES ARE WNLs, WILL NEED TO CHANGE TO ALTERNATIVE BIOLOGIC.  NEED URINE HCG PRIOR TO CT.

## 2019-02-05 NOTE — Telephone Encounter (Signed)
Per [employer] no PA is required for cte

## 2019-02-05 NOTE — Addendum Note (Signed)
Addended by: Danie Binder on: 02/05/2019 03:48 PM   Modules accepted: Orders

## 2019-02-07 DIAGNOSIS — K5090 Crohn's disease, unspecified, without complications: Secondary | ICD-10-CM | POA: Diagnosis not present

## 2019-02-08 ENCOUNTER — Ambulatory Visit (HOSPITAL_COMMUNITY): Admission: RE | Admit: 2019-02-08 | Payer: Medicare Other | Source: Ambulatory Visit

## 2019-02-08 LAB — PREGNANCY, URINE: Preg Test, Ur: NEGATIVE

## 2019-02-11 NOTE — Telephone Encounter (Signed)
Mindy, did she give a reason? FYI to [name] regarding cancellation of CTE.

## 2019-02-11 NOTE — Telephone Encounter (Signed)
Per cancellation notes "Patient (pt called to cancel exam-she will contact her dr office to r/s)"

## 2019-02-11 NOTE — Progress Notes (Signed)
DEXA scan consistent with osteoporosis. She needs to be taking calcium and Vitamin D as outlined by Dr. Dorris Fetch. I also encourage her to take the prescribed Fosamax once per week as also recommended by Dr. Dorris Fetch.

## 2019-02-11 NOTE — Telephone Encounter (Signed)
REVIEWED-NO ADDITIONAL RECOMMENDATIONS. 

## 2019-02-11 NOTE — Telephone Encounter (Signed)
Patient did not have CTE done. She called and cancelled it. FYI to AB

## 2019-02-12 NOTE — Progress Notes (Signed)
PT is aware. Said she will have to reschedule the CT later, her mom is not going to let her go to the hospital at this time.  I told her she would need to call Radiology to reschedule.

## 2019-02-15 ENCOUNTER — Telehealth: Payer: Self-pay | Admitting: Gastroenterology

## 2019-02-15 NOTE — Telephone Encounter (Signed)
I spoke to pt and she said that she is fine, she is not having any symptoms or problems. She just wants to make sure that she does not have the corona virus. I told pt that we do not have anyway of testing and that if she needed to be tested she should discuss with PCP. She said OK.

## 2019-02-15 NOTE — Telephone Encounter (Signed)
PLEASE CALL PT. If she doesn't have any symptoms she should not be tested for Corona virus. There are no community acquired cases in [place]. SHE SHOULD AVOID MEDICAL FACILITIES/OFFICES UNLESS SHE IS HAVING SYMPTOMS.

## 2019-02-15 NOTE — Telephone Encounter (Signed)
Patient called wanting to be referred to have a test for coronavirus....Marland KitchenMarland KitchenSaid she doesn't think she has it

## 2019-02-15 NOTE — Telephone Encounter (Signed)
Pt is aware.  

## 2019-02-27 DIAGNOSIS — K819 Cholecystitis, unspecified: Secondary | ICD-10-CM | POA: Diagnosis not present

## 2019-02-27 DIAGNOSIS — Z299 Encounter for prophylactic measures, unspecified: Secondary | ICD-10-CM | POA: Diagnosis not present

## 2019-02-27 DIAGNOSIS — R1110 Vomiting, unspecified: Secondary | ICD-10-CM | POA: Diagnosis not present

## 2019-02-27 DIAGNOSIS — K5090 Crohn's disease, unspecified, without complications: Secondary | ICD-10-CM | POA: Diagnosis not present

## 2019-02-27 DIAGNOSIS — N281 Cyst of kidney, acquired: Secondary | ICD-10-CM | POA: Diagnosis not present

## 2019-02-27 DIAGNOSIS — R1011 Right upper quadrant pain: Secondary | ICD-10-CM | POA: Diagnosis not present

## 2019-02-27 DIAGNOSIS — Z681 Body mass index (BMI) 19 or less, adult: Secondary | ICD-10-CM | POA: Diagnosis not present

## 2019-02-27 DIAGNOSIS — Z713 Dietary counseling and surveillance: Secondary | ICD-10-CM | POA: Diagnosis not present

## 2019-02-27 DIAGNOSIS — K769 Liver disease, unspecified: Secondary | ICD-10-CM | POA: Diagnosis not present

## 2019-02-27 DIAGNOSIS — K7689 Other specified diseases of liver: Secondary | ICD-10-CM | POA: Diagnosis not present

## 2019-03-01 ENCOUNTER — Other Ambulatory Visit: Payer: Self-pay

## 2019-03-01 ENCOUNTER — Encounter (HOSPITAL_COMMUNITY)
Admission: RE | Admit: 2019-03-01 | Discharge: 2019-03-01 | Disposition: A | Discharge status: Home / Self Care | Payer: Medicare Other | Source: Ambulatory Visit | Attending: Gastroenterology | Admitting: Gastroenterology

## 2019-03-01 DIAGNOSIS — K5090 Crohn's disease, unspecified, without complications: Secondary | ICD-10-CM | POA: Insufficient documentation

## 2019-03-01 MED ORDER — SODIUM CHLORIDE 0.9 % IV SOLN
300.0000 mg | INTRAVENOUS | Status: DC
  Administered 2019-03-01: 300 mg via INTRAVENOUS
  Filled 2019-03-01: qty 30

## 2019-03-01 MED ORDER — LORATADINE 10 MG PO TABS
10.0000 mg | ORAL_TABLET | Freq: Every day | ORAL | Status: DC
  Administered 2019-03-01: 09:00:00 10 mg via ORAL

## 2019-03-01 MED ORDER — ACETAMINOPHEN 325 MG PO TABS
650.0000 mg | ORAL_TABLET | Freq: Once | ORAL | Status: AC
  Administered 2019-03-01: 09:00:00 650 mg via ORAL

## 2019-03-01 MED ORDER — SODIUM CHLORIDE 0.9 % IV SOLN
INTRAVENOUS | Status: DC
  Administered 2019-03-01: 09:00:00 via INTRAVENOUS

## 2019-03-07 DIAGNOSIS — Z3042 Encounter for surveillance of injectable contraceptive: Secondary | ICD-10-CM | POA: Diagnosis not present

## 2019-03-20 ENCOUNTER — Ambulatory Visit: Payer: Medicare Other | Admitting: Gastroenterology

## 2019-03-25 DIAGNOSIS — K279 Peptic ulcer, site unspecified, unspecified as acute or chronic, without hemorrhage or perforation: Secondary | ICD-10-CM | POA: Diagnosis not present

## 2019-03-25 DIAGNOSIS — M810 Age-related osteoporosis without current pathological fracture: Secondary | ICD-10-CM | POA: Diagnosis not present

## 2019-03-25 DIAGNOSIS — Z681 Body mass index (BMI) 19 or less, adult: Secondary | ICD-10-CM | POA: Diagnosis not present

## 2019-03-25 DIAGNOSIS — Z299 Encounter for prophylactic measures, unspecified: Secondary | ICD-10-CM | POA: Diagnosis not present

## 2019-03-25 DIAGNOSIS — K5090 Crohn's disease, unspecified, without complications: Secondary | ICD-10-CM | POA: Diagnosis not present

## 2019-03-25 DIAGNOSIS — R109 Unspecified abdominal pain: Secondary | ICD-10-CM | POA: Diagnosis not present

## 2019-03-27 ENCOUNTER — Ambulatory Visit (INDEPENDENT_AMBULATORY_CARE_PROVIDER_SITE_OTHER): Payer: Medicare Other | Admitting: Gastroenterology

## 2019-03-27 ENCOUNTER — Encounter: Payer: Self-pay | Admitting: Gastroenterology

## 2019-03-27 ENCOUNTER — Other Ambulatory Visit: Payer: Self-pay

## 2019-03-27 DIAGNOSIS — K50913 Crohn's disease, unspecified, with fistula: Secondary | ICD-10-CM

## 2019-03-27 DIAGNOSIS — R102 Pelvic and perineal pain: Secondary | ICD-10-CM | POA: Insufficient documentation

## 2019-03-27 NOTE — Assessment & Plan Note (Addendum)
SYMPTOMS FAIRLY WELL CONTROLLED ON REMICADE Q6 WEEKS.  CONTINUE REMICADE Q6 WEEKS FOR 4 MOS AND IF NO CLINICAL IMPROVEMENT WILL GO BACK TO Q8 WEEKS. CONTINUE IMURAN AND REMICADE. FOLLOW UP IN 4 MOS.  WILL REASSESS NEED FOR Q6 WK REMICADE AT NEXT OPV.

## 2019-03-27 NOTE — Progress Notes (Signed)
Subjective:    Patient ID: Kendra Franklin, female    DOB: 1991-01-15, 28 y.o.   MRN: [id number]   Primary Care Physician:  Monico Blitz, MD  Primary GI:  Barney Drain, MD   Patient Location: home   Provider Location: [facility] office   Reason for Visit:  SUPRPAUBIC pain   Persons present on the virtual encounter, with roles: patient, myself (provider), MARTINA BOOTH CMA (update meds/allergies)   Total time (minutes) spent on medical discussion:  MINUTES   Due to COVID-19, visit was VIA TELEPHONE VISIT DUE TO COVID 19. VISIT IS CONDUCTED VIRTUALLY AND WAS REQUESTED BY PATIENT.   Virtual Visit via TELEPHONE   I connected with Kendra Franklin  and verified that I am speaking with the correct person using two identifiers.   I discussed the limitations, risks, security and privacy concerns of performing an evaluation and management service by telephone/video and the availability of in person appointments. I also discussed with the patient that there may be a patient responsible charge related to this service. The patient expressed understanding and agreed to proceed.   HPI PAIN IN SUPRAPUBIC AREA FOR PAST 5 DAYS: SHARP/ACHY, CONSTANT, WORSE IN AM AND AT NIGHT, NAUSEA: MILD, DYSURIA OR HEMATURIA, NO RADIATION, IF SHE TOUCHES IT IT HURTS, NO REDNESS OR SWELLING, ASSOCIATED WITH BLOATING. HAVING AT LEAST ONE BM A DAY(GREEN)- SOLID STOOL WITH MUCOUS. LAST DIARRHEA: FEW MOS. NOT PASSING AIR WITH URINATION. SAW BROWN STUFF IN VAGINA(3 TIMES IN HER LIFE AND ONE TIME WAS 3 WEEKS AGO). LAST SEXUAL INTERCOURSE FRI(ACTIVE 3-4X/WEEK) AND IT WAS NOT PAINFUL. GETTING MARRIED ON FACE TIME. DIDN'T GET CTE. NEXT REMICADE DOSE MAY 15. USING PHENERGAN ONCE A WEEK.  REMICADE EVERY 6 WEEKS DIDN'T CHANGE PERIUMBILICAL ABDOMINAL PAIN EVERY DAY COMES AND GOES. WILLING TO COMPLETE CTE.   PT DENIES FEVER, CHILLS, HEMATOCHEZIA, HEMATEMESIS, vomiting, melena, diarrhea, CHEST PAIN, SHORTNESS OF BREATH, CHANGE IN BOWEL  IN HABITS, constipation,  problems swallowing, OR heartburn or indigestion.  Past Medical History:  Diagnosis Date  . Allergy    medicine  . Bloody diarrhea 03/11/2015  . Crohn's    DX   DEC 2011--- ASCA 100 pANCA NEG-APR 2012 6-TGN 233(LLN 230)  6-MMPN 1173 ON 75 MG IMURAN  . Crohn's disease, small and large intestine ([place])   . History of anal fissures   . History of anal lesion    2009   NON--HEALING  . History of kidney stones   . History of small intestine ulcer    2011  . Osteoporosis     Past Surgical History:  Procedure Laterality Date  . COLONOSCOPY  DEC 2011   ILEO-COLONIC ULCERS  . COLONOSCOPY N/A 03/16/2015   SLF: 1/ The examined terminal ileum appeared to be normal 2. The left colon is redundatnt 3. Rectal bleeding due to small internal hemorrhoids  . COLONOSCOPY N/A 12/26/2017   Procedure: COLONOSCOPY;  Surgeon: Danie Binder, MD;  [facility];  Service: Endoscopy;  Laterality: N/A;  2:15pm  . CYSTOSCOPY WITH RETROGRADE PYELOGRAM, URETEROSCOPY AND STENT PLACEMENT Left 02/27/2014   Procedure: CYSTOSCOPY WITH RETROGRADE PYELOGRAM, URETEROSCOPY/ STENT PLACEMENT;  Surgeon: Alexis Frock, MD;  Location: [facility];  Service: Urology;  Laterality: Left;  . ESOPHAGOGASTRODUODENOSCOPY N/A 03/16/2015   SLF: 1. epigastric pain most likely due to gastritis/pyschosocial stressors, less likely GERD. 2. Mild non-erosive gastritis.   Marland Kitchen ESOPHAGOGASTRODUODENOSCOPY N/A 12/26/2017   Procedure: ESOPHAGOGASTRODUODENOSCOPY (EGD);  Surgeon: Danie Binder, MD;  [facility];  Service: Endoscopy;  Laterality: N/A;  . FLEXIBLE SIGMOIDOSCOPY N/A 12/11/2013   Procedure: FLEXIBLE SIGMOIDOSCOPY;  Surgeon: Danie Binder, MD;  [facility];  Service: Endoscopy;  Laterality: N/A;  1:45 PM  . GIVENS CAPSULE STUDY  DEC 2011   SB ULCERS  . GIVENS CAPSULE STUDY N/A 01/08/2018   Procedure: GIVENS CAPSULE STUDY;  Surgeon: Danie Binder, MD;  [facility];  Service: Endoscopy;  Laterality: N/A;  7:30am  . HOLMIUM LASER APPLICATION Left 03/01/3153   Procedure: HOLMIUM LASER APPLICATION;  Surgeon: Alexis Frock, MD;  Location: [facility];  Service: Urology;  Laterality: Left;  . INCISION AND DRAINAGE PERIRECTAL ABSCESS  2009  &  2010   Allergies  Allergen Reactions  . Morphine Hives  . Other Rash    [place] causes bruising and rash    Current Outpatient Medications  Medication Sig    . alendronate (FOSAMAX) 70 MG tablet Take 1 tablet (70 mg total) by mouth every 7 (seven) days. Take with a full glass of water on an empty stomach.    Marland Kitchen azaTHIOprine (IMURAN) 50 MG tablet Take 2 tablets (100 mg total) by mouth daily.    Marland Kitchen inFLIXimab (REMICADE) 100 MG injection Inject 300 mg into the vein every 6 (six) weeks. LAST INJECTION APR 3 AND NEXT MAY 15    . ondansetron (ZOFRAN) 4 MG tablet Take 1 tablet (4 mg total) by mouth every 8 (eight) hours as needed for nausea or vomiting.    . polyethylene glycol powder   MIX 17 GRAMS (MARKED ON CAP) WITH LIQUID AND DRINK ONCE DAILY AS NEEDED FOR CONTSTIPATION)    . promethazine (PHENERGAN) 6.25 MG/5ML syrup Take 5 mLs (6.25 mg total) by mouth every 6 (six) hours as needed for nausea, vomiting or refractory nausea / vomiting.    . valACYclovir (VALTREX) 1000 MG tablet Take 1,000 mg by mouth daily.     Review of Systems PER HPI OTHERWISE ALL SYSTEMS ARE NEGATIVE.    Objective:   Physical Exam  TELEPHONE VISIT DUE TO COVID 19, VISIT IS CONDUCTED VIRTUALLY AND WAS REQUESTED BY PATIENT.     Assessment & Plan:

## 2019-03-27 NOTE — Assessment & Plan Note (Addendum)
IN PT WHO IS IMMUNOCOMPROMISED. SYMPTOMS NOT CONTROLLED.  SUBMIT URINE SAMPLE IN AM AT [facility]. CONTINUE IMURAN AND REMICADE FOR NOW. DISCUSSED BENEFITS AND MANAGEMENT OPTIONS OF GETTING CTE NOW OR WIATING. WE WILL CONSIDER CT ENTEROGRAPHY IF PT DEVELOPS RECTAL BLEEDING, DIARRHEA, AND WORSENING ABDOMINAL PAIN.  PT AGREES AND PT VOICED HER UNDERSTANDING. CONSIDER PT FOR IONTOPHORESIS OR PAIN CLINIC  FOR TRIGGER POINT INJECTIONS FOR ABDOMINAL PAIN. FOLLOW UP IN 4 MOS.

## 2019-03-27 NOTE — Patient Instructions (Addendum)
SUBMIT URINE SAMPLE IN AM AT [facility].  CONTINUE IMURAN AND REMICADE FOR NOW.  CONSIDER CT ENTEROGRAPHY IF PT DEVELOPS RECTAL BLEEDING, DIARRHEA, AND WORSENING ABDOMINAL PAIN.  CONSIDER PT FOR IONTOPHORESIS OR PAIN CLINIC  FOR TRIGGER POINT INJECTIONS.  FOLLOW UP IN 4 MOS.

## 2019-03-28 ENCOUNTER — Other Ambulatory Visit (HOSPITAL_COMMUNITY)
Admission: RE | Admit: 2019-03-28 | Discharge: 2019-03-28 | Disposition: A | Discharge status: Home / Self Care | Payer: Medicare Other | Source: Ambulatory Visit | Attending: Gastroenterology | Admitting: Gastroenterology

## 2019-03-28 ENCOUNTER — Telehealth: Payer: Self-pay | Admitting: Gastroenterology

## 2019-03-28 DIAGNOSIS — R102 Pelvic and perineal pain: Secondary | ICD-10-CM | POA: Insufficient documentation

## 2019-03-28 LAB — URINALYSIS, ROUTINE W REFLEX MICROSCOPIC
Bacteria, UA: NONE SEEN
Bilirubin Urine: NEGATIVE
Glucose, UA: NEGATIVE mg/dL
Ketones, ur: 5 mg/dL — AB
Leukocytes,Ua: NEGATIVE
Nitrite: NEGATIVE
Protein, ur: NEGATIVE mg/dL
Specific Gravity, Urine: 1.02 (ref 1.005–1.030)
pH: 5 (ref 5.0–8.0)

## 2019-03-28 MED ORDER — CIPROFLOXACIN HCL 500 MG PO TABS: ORAL_TABLET | ORAL | 0 refills | Status: DC

## 2019-03-28 NOTE — Telephone Encounter (Signed)
Called patient TO DISCUSS RESULTS. UA SHOWS MICROSCOPIC HEMATURIA. NEEDS CIPRO BID FOR 3 DAYS.

## 2019-03-28 NOTE — Progress Notes (Signed)
CC'D TO PCP °

## 2019-03-28 NOTE — Progress Notes (Signed)
ON RECALL  °

## 2019-03-29 LAB — URINE CULTURE: Culture: <10000 — AB

## 2019-04-12 ENCOUNTER — Other Ambulatory Visit: Payer: Self-pay

## 2019-04-12 ENCOUNTER — Encounter (HOSPITAL_COMMUNITY)
Admission: RE | Admit: 2019-04-12 | Discharge: 2019-04-12 | Disposition: A | Discharge status: Home / Self Care | Payer: Medicare Other | Source: Ambulatory Visit | Attending: Gastroenterology | Admitting: Gastroenterology

## 2019-04-12 DIAGNOSIS — K5090 Crohn's disease, unspecified, without complications: Secondary | ICD-10-CM | POA: Insufficient documentation

## 2019-04-12 MED ORDER — ACETAMINOPHEN 325 MG PO TABS
ORAL_TABLET | ORAL | Status: AC
  Filled 2019-04-12: qty 2

## 2019-04-12 MED ORDER — LORATADINE 10 MG PO TABS
ORAL_TABLET | ORAL | Status: AC
  Filled 2019-04-12: qty 1

## 2019-04-12 MED ORDER — SODIUM CHLORIDE 0.9 % IV SOLN
INTRAVENOUS | Status: DC
  Administered 2019-04-12: 09:00:00 via INTRAVENOUS

## 2019-04-12 MED ORDER — LORATADINE 10 MG PO TABS
10.0000 mg | ORAL_TABLET | Freq: Every day | ORAL | Status: DC
  Administered 2019-04-12: 10 mg via ORAL

## 2019-04-12 MED ORDER — SODIUM CHLORIDE 0.9 % IV SOLN
300.0000 mg | Freq: Once | INTRAVENOUS | Status: AC
  Administered 2019-04-12: 300 mg via INTRAVENOUS
  Filled 2019-04-12: qty 30

## 2019-04-12 MED ORDER — ACETAMINOPHEN 325 MG PO TABS
650.0000 mg | ORAL_TABLET | Freq: Once | ORAL | Status: AC
  Administered 2019-04-12: 650 mg via ORAL

## 2019-04-18 ENCOUNTER — Telehealth: Payer: Self-pay

## 2019-04-18 NOTE — Telephone Encounter (Signed)
Dr.Fields, I have been unable to reach [place].

## 2019-04-18 NOTE — Telephone Encounter (Signed)
Pt called and said she would like to discuss her abdominal pain with [name]. She said she had her Remicade treatment on 04/12/2019 and it has not helped her at all. She continues to have abdominal pain in her belly button area everyday. The pain is intermittent, but she never goes a day without any pain.  Her appetite has increased a little but when she eats then she gets nauseated. Her stools are normal most of the time and daily, although she did have a little diarrhea this AM. She would like a return call @ [phone].  She is aware that [name] is at the hospital but will be informed.

## 2019-04-18 NOTE — Telephone Encounter (Signed)
Tried to call pt- phone number was busy.

## 2019-04-18 NOTE — Telephone Encounter (Signed)
PLEASE CALL PT. IF SHE IS HAVING ABDOMINAL PAIN IN SPITE OF GETTING REMICADE. SHE SHOULD COMPLETE THE CT ENTEROGRAPHY.

## 2019-04-18 NOTE — Telephone Encounter (Signed)
REVIEWED. Will attempt to contact pt.

## 2019-04-18 NOTE — Telephone Encounter (Signed)
Tried to call pt- phone number was busy

## 2019-04-23 NOTE — Telephone Encounter (Signed)
PT is aware and Ok to schedule the CT.  She can be reached at [phone].

## 2019-04-23 NOTE — Telephone Encounter (Signed)
REVIEWED-NO ADDITIONAL RECOMMENDATIONS. 

## 2019-04-23 NOTE — Telephone Encounter (Signed)
CT Entero scheduled for 04/25/19 at 11:00am, arrive at 9:45am. NPO 4 hours prior to test. Called and informed pt of appt.

## 2019-04-25 ENCOUNTER — Ambulatory Visit (HOSPITAL_COMMUNITY)
Admission: RE | Admit: 2019-04-25 | Discharge: 2019-04-25 | Disposition: A | Discharge status: Home / Self Care | Payer: Medicare Other | Source: Ambulatory Visit | Attending: Gastroenterology | Admitting: Gastroenterology

## 2019-04-25 ENCOUNTER — Other Ambulatory Visit: Payer: Self-pay

## 2019-04-25 DIAGNOSIS — N200 Calculus of kidney: Secondary | ICD-10-CM | POA: Diagnosis not present

## 2019-04-25 DIAGNOSIS — K5090 Crohn's disease, unspecified, without complications: Secondary | ICD-10-CM | POA: Diagnosis not present

## 2019-04-25 MED ORDER — IOHEXOL 300 MG/ML  SOLN
100.0000 mL | Freq: Once | INTRAMUSCULAR | Status: AC | PRN
  Administered 2019-04-25: 100 mL via INTRAVENOUS

## 2019-04-25 MED ORDER — BARIUM SULFATE 0.1 % PO SUSP
ORAL | Status: AC
  Filled 2019-04-25: qty 3

## 2019-04-29 NOTE — Progress Notes (Signed)
CC'D TO PCP °

## 2019-04-29 NOTE — Progress Notes (Signed)
PT is aware.

## 2019-05-01 ENCOUNTER — Telehealth: Payer: Self-pay

## 2019-05-01 ENCOUNTER — Ambulatory Visit: Payer: Medicare Other | Admitting: Gastroenterology

## 2019-05-01 MED ORDER — HYDROCODONE-ACETAMINOPHEN 5-325 MG PO TABS: ORAL_TABLET | ORAL | 0 refills | Status: DC

## 2019-05-01 MED ORDER — HYDROCODONE-ACETAMINOPHEN 5-325 MG PO TABS: 1.0000 | ORAL_TABLET | Freq: Four times a day (QID) | ORAL | 0 refills | Status: DC | PRN

## 2019-05-01 NOTE — Telephone Encounter (Signed)
Pt called and said she is having her usual abdominal pain in the same place. Kendra Franklin it is much worse and keeps feeling worse.  She wants to know if [name] will send her some pain medicine to [facility] [place]. Her last Bm was yesterday and was watery. [name], please advise!

## 2019-05-01 NOTE — Telephone Encounter (Signed)
CT-scan of the abdomen MAY 28 AND HAD DIARRHEA THAT DAY. BEFORE CAME SHE HAD DIARRHEA FOR 2 DAYS. JUST HAVING ABDOMINAL PAIN. NO REGULAR STOOL AND PASSING GAS AND MUCOUS. LAST NL STOOL. ~1.5 WEEKS AGO. SEXUALLY ACTIVE SAT MAY 30. FEELS SAME PAIN BUT GOT WORSE TODAY.  LMP: ON DEPO-NO CYCLE IN 6 YEARS. HAS GYN OFC: [facility].  WILL PRESCRIBE A FEW PAIN MEDS:  HYDROCODONE. TRIED TO SEND ELECTRONICALLY. UNABLE TO COMPLETE. PT WILL NEED TO PICK UP RX. CALL PT AND LET HER KNOW.

## 2019-05-01 NOTE — Telephone Encounter (Signed)
LVM FOR HER NURSE: ASHLEY. PT SHOULD BE SEEN TO BE EVALUATED FOR UTI OR PID. THEY MAY CALL MY CELL WITH QUESTIONS.

## 2019-05-01 NOTE — Telephone Encounter (Signed)
Pt is aware and Rx is at front for pick up.

## 2019-05-07 ENCOUNTER — Telehealth: Payer: Self-pay

## 2019-05-07 DIAGNOSIS — R112 Nausea with vomiting, unspecified: Secondary | ICD-10-CM

## 2019-05-07 DIAGNOSIS — R109 Unspecified abdominal pain: Secondary | ICD-10-CM

## 2019-05-07 DIAGNOSIS — R197 Diarrhea, unspecified: Secondary | ICD-10-CM

## 2019-05-07 NOTE — Telephone Encounter (Signed)
Reviewed chart, spoke with Tamela Oddi, do not see any documentation for this. Will need to wait for [facility] to return. Phone number is for [facility].

## 2019-05-07 NOTE — Telephone Encounter (Signed)
Pt called and said when she last talked to [name] she told her that if she would like to go to a different GI doctor to let her know and she would be glad to refer her.  She has picked a group but has no preference for doctor.  She doesn't know the group name but the phone number is [phone].  I told her [name] is off until next Tuesday.  I will have to run it by office personnel since I do not have documentation of that.   Almyra Free, please advise!

## 2019-05-14 NOTE — Addendum Note (Signed)
Addended by: Inge Rise on: 05/14/2019 12:01 PM   Modules accepted: Orders

## 2019-05-14 NOTE — Telephone Encounter (Signed)
Referral has been sent.

## 2019-05-14 NOTE — Telephone Encounter (Signed)
REFER PT TO [facility].

## 2019-05-14 NOTE — Telephone Encounter (Signed)
Lmom, waiting on a return call.  

## 2019-05-14 NOTE — Telephone Encounter (Signed)
Pt returned call. Pt was given the names of the GI clinics recommended by [name]. Pt answers back " how is she going to recommend an office and not let me pick my own doctor". Pt is aware that these are the GI clinics [name] recommends and pt states she talked to [facility] and gave her the place she wants to go and [name] can call that number and refer me to any of those doctors. Pt is aware that when she spoke with Tamela Oddi that no clinic name was given, just a phone number. Pt repeated the above statement saying [name] can call the number I gave to refer her.  Number of the office listed below with no clinic name is [phone].I called the clinic number and it is [facility].

## 2019-05-14 NOTE — Telephone Encounter (Signed)
PLEASE CALL PT. IF SHE WANTS A 2ND OPINION, IT NEEDS TO BE AT [facility], [facility], OR [facility]. SHE SHOULD LET us KNOW WHERE SHE WOULD LIKE TO BE REFERRED.

## 2019-05-15 ENCOUNTER — Telehealth: Payer: Self-pay | Admitting: *Deleted

## 2019-05-15 NOTE — Telephone Encounter (Signed)
Pt is aware and said she will give [place] a call back.

## 2019-05-15 NOTE — Telephone Encounter (Signed)
LMOM to call.

## 2019-05-15 NOTE — Telephone Encounter (Signed)
Noted. [facility] closed out

## 2019-05-15 NOTE — Telephone Encounter (Signed)
Kendra Franklin from [place] called and said that their doctors reviewed our referral and suggested that this pt be referred to [facility] or [facility].

## 2019-05-15 NOTE — Telephone Encounter (Signed)
PLEASE CALL PT. Let her know/ ask her how she would like to proceed.

## 2019-05-17 NOTE — Telephone Encounter (Signed)
Referral faxed to [facility]

## 2019-05-17 NOTE — Telephone Encounter (Signed)
Note faxed.

## 2019-05-17 NOTE — Telephone Encounter (Addendum)
PT MOM CALLED. EXPLAINED PT HAS ABDOMINAL PAIN BUIT NO CAUSE IDENTIFIED. NO EVIDENCE OF ACTIVE CROHN;S CLINICALLY OR ON IMAGING. REFERRED TO GYN PENDING TO ASSESS FOR ENDOMYOMETRITIS, FIBROIDS, ENDOMETRIOSIS/CONSIDER EX LAP. CONCERNED ABOUT NVABD PAIN/WEIGHT LOSS. LAST EGD/TCS JAN 2019: NL COLON AND SMALL BOWEL BIOPSIES. GES JAN 2019: NL. GIVENS CAPSULE FEB 2019: MILD GASTRITIS, NL SMALL BOWEL. CTE  MAY 2020: NO ACTIVE DISEASE. THEY ARE AWARE IF NO SOURCE FOR ABDOMINAL PAIN CAN BE IDENTIFIED PT WILL NEED TO CONSIDER AN EX LAP.   1055: CALLED [facility] TO ASK FOR AN EXPEDITED APPT: TUES JUN 30 @0830  WITH DR. Rosario Adie.   REFER TO [facility] FOR A SECOND OPINION REGARDING ABDOMINAL PAIN.

## 2019-05-17 NOTE — Telephone Encounter (Addendum)
FAX TC TO DR. Barrie Dunker.

## 2019-05-24 ENCOUNTER — Encounter (HOSPITAL_COMMUNITY)
Admission: RE | Admit: 2019-05-24 | Discharge: 2019-05-24 | Disposition: A | Discharge status: Home / Self Care | Payer: Medicare Other | Source: Ambulatory Visit | Attending: Gastroenterology | Admitting: Gastroenterology

## 2019-05-24 ENCOUNTER — Other Ambulatory Visit: Payer: Self-pay

## 2019-05-24 ENCOUNTER — Encounter (HOSPITAL_COMMUNITY): Payer: Self-pay

## 2019-05-24 DIAGNOSIS — K5090 Crohn's disease, unspecified, without complications: Secondary | ICD-10-CM | POA: Insufficient documentation

## 2019-05-24 MED ORDER — LORATADINE 10 MG PO TABS
10.0000 mg | ORAL_TABLET | Freq: Once | ORAL | Status: AC
  Administered 2019-05-24: 10 mg via ORAL

## 2019-05-24 MED ORDER — ACETAMINOPHEN 325 MG PO TABS
650.0000 mg | ORAL_TABLET | Freq: Once | ORAL | Status: AC
  Administered 2019-05-24: 650 mg via ORAL

## 2019-05-24 MED ORDER — LORATADINE 10 MG PO TABS
ORAL_TABLET | ORAL | Status: AC
  Filled 2019-05-24: qty 1

## 2019-05-24 MED ORDER — SODIUM CHLORIDE 0.9 % IV SOLN
300.0000 mg | Freq: Once | INTRAVENOUS | Status: AC
  Administered 2019-05-24: 300 mg via INTRAVENOUS
  Filled 2019-05-24: qty 30

## 2019-05-24 MED ORDER — ACETAMINOPHEN 325 MG PO TABS
ORAL_TABLET | ORAL | Status: AC
  Filled 2019-05-24: qty 2

## 2019-05-24 MED ORDER — SODIUM CHLORIDE 0.9 % IV SOLN
Freq: Once | INTRAVENOUS | Status: AC
  Administered 2019-05-24: 09:00:00 via INTRAVENOUS

## 2019-05-28 DIAGNOSIS — R102 Pelvic and perineal pain: Secondary | ICD-10-CM | POA: Diagnosis not present

## 2019-05-30 DIAGNOSIS — Z3042 Encounter for surveillance of injectable contraceptive: Secondary | ICD-10-CM | POA: Diagnosis not present

## 2019-06-13 DIAGNOSIS — K469 Unspecified abdominal hernia without obstruction or gangrene: Secondary | ICD-10-CM | POA: Diagnosis not present

## 2019-06-13 DIAGNOSIS — R102 Pelvic and perineal pain: Secondary | ICD-10-CM | POA: Diagnosis not present

## 2019-06-13 DIAGNOSIS — R9389 Abnormal findings on diagnostic imaging of other specified body structures: Secondary | ICD-10-CM | POA: Diagnosis not present

## 2019-06-17 DIAGNOSIS — R1084 Generalized abdominal pain: Secondary | ICD-10-CM | POA: Diagnosis not present

## 2019-06-25 ENCOUNTER — Encounter: Payer: Self-pay | Admitting: Gastroenterology

## 2019-07-02 ENCOUNTER — Telehealth: Payer: Self-pay

## 2019-07-02 DIAGNOSIS — R109 Unspecified abdominal pain: Secondary | ICD-10-CM

## 2019-07-02 NOTE — Telephone Encounter (Signed)
Pt called and said she is still having abdominal pain right in her belly button area.  The pain is intermittent and she said it gets really bad at times.  She never went to GI doctor in [place] as referred.  She said they would not see her and were going to send her to [facility].  So they spoke with [name] and she referred to [facility]  Her appointment is not until 10/29/2019.   She went to GYN and got good report, they referred to a surgeon who said she does not have a hernia around her belly button.  She would like to have her appointment expidited at [facility].  She is aware that [name] is on vacation and I will send this note to [name], NP who is doing hospital today.  Forwarding to [place] to call [facility] and see if appointment can be expidited.

## 2019-07-02 NOTE — Telephone Encounter (Signed)
Called [facility] but they currently do not have any sooner appointments available. States patient can call them periodically to see if anyone has cancelled.

## 2019-07-02 NOTE — Telephone Encounter (Signed)
She was evaluated by Dr. Ladona Horns for umbilical hernia but not appreciated on CT per his notes. GYN appreciated this on exam. Unable to expedite [facility] appt. Is she interested in a second surgical opinion?

## 2019-07-02 NOTE — Telephone Encounter (Signed)
Pt is aware and would like to have surgical referral for a second opinion.  She is also aware to call [facility] frequently to see if they have cancellations to expidite the appointment.

## 2019-07-03 NOTE — Telephone Encounter (Signed)
Referral sent to [facility]

## 2019-07-03 NOTE — Telephone Encounter (Signed)
Here in [place] or if she prefers [place], then [facility].

## 2019-07-03 NOTE — Addendum Note (Signed)
Addended by: Inge Rise on: 07/03/2019 10:07 AM   Modules accepted: Orders

## 2019-07-03 NOTE — Telephone Encounter (Signed)
Kendra Franklin please advise where to send patient for 2nd opinion surgical referral? Thanks

## 2019-07-05 ENCOUNTER — Other Ambulatory Visit: Payer: Self-pay

## 2019-07-05 ENCOUNTER — Encounter (HOSPITAL_COMMUNITY)
Admission: RE | Admit: 2019-07-05 | Discharge: 2019-07-05 | Disposition: A | Discharge status: Home / Self Care | Payer: Medicare Other | Source: Ambulatory Visit | Attending: Gastroenterology | Admitting: Gastroenterology

## 2019-07-05 DIAGNOSIS — K5090 Crohn's disease, unspecified, without complications: Secondary | ICD-10-CM | POA: Insufficient documentation

## 2019-07-05 MED ORDER — SODIUM CHLORIDE 0.9 % IV SOLN
Freq: Once | INTRAVENOUS | Status: AC
  Administered 2019-07-05: 09:00:00 via INTRAVENOUS

## 2019-07-05 MED ORDER — LORATADINE 10 MG PO TABS
10.0000 mg | ORAL_TABLET | Freq: Every day | ORAL | Status: DC
  Administered 2019-07-05: 10 mg via ORAL

## 2019-07-05 MED ORDER — SODIUM CHLORIDE 0.9 % IV SOLN
300.0000 mg | Freq: Once | INTRAVENOUS | Status: AC
  Administered 2019-07-05: 09:00:00 300 mg via INTRAVENOUS
  Filled 2019-07-05: qty 30

## 2019-07-05 MED ORDER — ACETAMINOPHEN 325 MG PO TABS
650.0000 mg | ORAL_TABLET | Freq: Once | ORAL | Status: AC
  Administered 2019-07-05: 09:00:00 650 mg via ORAL

## 2019-07-09 ENCOUNTER — Telehealth: Payer: Self-pay

## 2019-07-09 NOTE — Telephone Encounter (Signed)
Pt called to let [name] know that she passed a kidney stone and she has a few more to pass.  She also wanted to know when to expect a call from surgeon for the hernia.  I told her the referral was sent to [facility] and she should be hearing from them soon.

## 2019-07-10 NOTE — Telephone Encounter (Signed)
REVIEWED-NO ADDITIONAL RECOMMENDATIONS. 

## 2019-07-11 NOTE — Telephone Encounter (Signed)
REVIEWED-NO ADDITIONAL RECOMMENDATIONS. 

## 2019-07-15 ENCOUNTER — Other Ambulatory Visit: Payer: Self-pay | Admitting: Nurse Practitioner

## 2019-07-15 DIAGNOSIS — K50913 Crohn's disease, unspecified, with fistula: Secondary | ICD-10-CM

## 2019-07-18 ENCOUNTER — Telehealth: Payer: Self-pay

## 2019-07-18 NOTE — Telephone Encounter (Signed)
Pt left Vm that she has not heard from the referral to [facility].  Does pt need to call them?   Forwarding to [facility] to advise!

## 2019-07-18 NOTE — Telephone Encounter (Signed)
Pt is aware and has the number to call.

## 2019-07-18 NOTE — Telephone Encounter (Signed)
Looked at [facility] and states referral is in review. She can call them to check status. They call patients to schedule appointment. [phone]

## 2019-07-29 DIAGNOSIS — E7253 Hyperoxaluria: Secondary | ICD-10-CM | POA: Diagnosis not present

## 2019-07-29 DIAGNOSIS — N281 Cyst of kidney, acquired: Secondary | ICD-10-CM | POA: Diagnosis not present

## 2019-07-29 DIAGNOSIS — N200 Calculus of kidney: Secondary | ICD-10-CM | POA: Diagnosis not present

## 2019-07-30 DIAGNOSIS — K5090 Crohn's disease, unspecified, without complications: Secondary | ICD-10-CM | POA: Diagnosis not present

## 2019-08-01 ENCOUNTER — Telehealth: Payer: Self-pay | Admitting: Gastroenterology

## 2019-08-01 NOTE — Telephone Encounter (Addendum)
REMICADE ORDER RENEWED.

## 2019-08-02 ENCOUNTER — Other Ambulatory Visit: Payer: Self-pay

## 2019-08-02 DIAGNOSIS — K50913 Crohn's disease, unspecified, with fistula: Secondary | ICD-10-CM

## 2019-08-02 MED ORDER — AZATHIOPRINE 50 MG PO TABS: 100.0000 mg | ORAL_TABLET | Freq: Every day | ORAL | 5 refills | Status: DC

## 2019-08-02 NOTE — Telephone Encounter (Signed)
Done

## 2019-08-02 NOTE — Telephone Encounter (Signed)
Request received for 90 day supply of Azathioprine 50 mg to [facility]. Forwarding to refill box.

## 2019-08-09 ENCOUNTER — Telehealth: Payer: Self-pay | Admitting: Gastroenterology

## 2019-08-09 NOTE — Telephone Encounter (Signed)
REVIEWED. AGREE. NO ADDITIONAL RECOMMENDATIONS. 

## 2019-08-09 NOTE — Telephone Encounter (Signed)
LETTER COMPLETE. PLACE ON LETTERHEAD  AND EMAIL TO [email or web address] TODAY   TO WHOM IT MAY CONCERN:  Kendra Franklin, 06-03-1991 HAS BEEN MY PATIENT SINCE 2011. SHE HAS CROHN'S DISEASE THAT REQUIRES MULTIPLE IMMUNOSUPPRESSANT DRUGS. SHE HAS BEEN TAKING THESE MEDICINES SINCE 2011. DUE TO HER MEDICAL CONDITION AND MEDICATIONS SHE IS HIGH RISK FOR CONTRACTING COVID.   PLEASE FEEL FREE TO CONTACT ME WITH QUESTIONS.  SINCERELY. DR. Carlyon Prows FIELDS

## 2019-08-09 NOTE — Telephone Encounter (Signed)
Letter has been emailed and I placed a copy in the mail to the pt.

## 2019-08-12 NOTE — Telephone Encounter (Signed)
Reviewed

## 2019-08-15 ENCOUNTER — Other Ambulatory Visit: Payer: Self-pay | Admitting: "Endocrinology

## 2019-08-16 ENCOUNTER — Encounter (HOSPITAL_COMMUNITY): Payer: Self-pay

## 2019-08-16 ENCOUNTER — Other Ambulatory Visit: Payer: Self-pay

## 2019-08-16 ENCOUNTER — Encounter (HOSPITAL_COMMUNITY)
Admission: RE | Admit: 2019-08-16 | Discharge: 2019-08-16 | Disposition: A | Discharge status: Home / Self Care | Payer: Medicare Other | Source: Ambulatory Visit | Attending: Gastroenterology | Admitting: Gastroenterology

## 2019-08-16 DIAGNOSIS — K5090 Crohn's disease, unspecified, without complications: Secondary | ICD-10-CM | POA: Diagnosis not present

## 2019-08-16 MED ORDER — SODIUM CHLORIDE 0.9 % IV SOLN
Freq: Once | INTRAVENOUS | Status: AC
  Administered 2019-08-16: 09:00:00 via INTRAVENOUS

## 2019-08-16 MED ORDER — SODIUM CHLORIDE 0.9 % IV SOLN
300.0000 mg | Freq: Once | INTRAVENOUS | Status: AC
  Administered 2019-08-16: 09:00:00 300 mg via INTRAVENOUS
  Filled 2019-08-16: qty 30

## 2019-08-16 MED ORDER — ACETAMINOPHEN 325 MG PO TABS
650.0000 mg | ORAL_TABLET | Freq: Once | ORAL | Status: AC
  Administered 2019-08-16: 09:00:00 650 mg via ORAL

## 2019-08-16 MED ORDER — LORATADINE 10 MG PO TABS
10.0000 mg | ORAL_TABLET | Freq: Once | ORAL | Status: AC
  Administered 2019-08-16: 10 mg via ORAL

## 2019-09-02 DIAGNOSIS — Z3202 Encounter for pregnancy test, result negative: Secondary | ICD-10-CM | POA: Diagnosis not present

## 2019-09-02 DIAGNOSIS — Z3042 Encounter for surveillance of injectable contraceptive: Secondary | ICD-10-CM | POA: Diagnosis not present

## 2019-09-12 ENCOUNTER — Ambulatory Visit: Payer: Medicare Other | Admitting: "Endocrinology

## 2019-09-12 ENCOUNTER — Ambulatory Visit (INDEPENDENT_AMBULATORY_CARE_PROVIDER_SITE_OTHER): Payer: Medicare Other | Admitting: "Endocrinology

## 2019-09-12 ENCOUNTER — Other Ambulatory Visit: Payer: Self-pay

## 2019-09-12 ENCOUNTER — Encounter: Payer: Self-pay | Admitting: "Endocrinology

## 2019-09-12 DIAGNOSIS — K50913 Crohn's disease, unspecified, with fistula: Secondary | ICD-10-CM | POA: Diagnosis not present

## 2019-09-12 DIAGNOSIS — B001 Herpesviral vesicular dermatitis: Secondary | ICD-10-CM | POA: Diagnosis not present

## 2019-09-12 DIAGNOSIS — N898 Other specified noninflammatory disorders of vagina: Secondary | ICD-10-CM | POA: Diagnosis not present

## 2019-09-12 DIAGNOSIS — T380X5A Adverse effect of glucocorticoids and synthetic analogues, initial encounter: Secondary | ICD-10-CM | POA: Diagnosis not present

## 2019-09-12 DIAGNOSIS — M818 Other osteoporosis without current pathological fracture: Secondary | ICD-10-CM

## 2019-09-12 IMAGING — CT CT ABD-PELV W/ CM
2 of 3 series · 14 of 46 positions shown, 16 images · IV contrast (iopamidol)
Comparison: 09/02/2017

CLINICAL DATA: Nausea, vomiting, and diarrhea for 5 days with
reduced appetite. History of Crohn disease.

EXAM:
CT ABDOMEN AND PELVIS WITH CONTRAST
TECHNIQUE: Multidetector CT imaging of the abdomen and pelvis was performed
using the standard protocol following bolus administration of
intravenous contrast.
CONTRAST:  100mL TJWXUB-HXX IOPAMIDOL (TJWXUB-HXX) INJECTION 61%

[Series 2: axial st · axial · 0.56mm/px · z∈[-504,-109]mm · 11 of 91 slices shown, 13 images]
[im 6/91  soft-tissue]
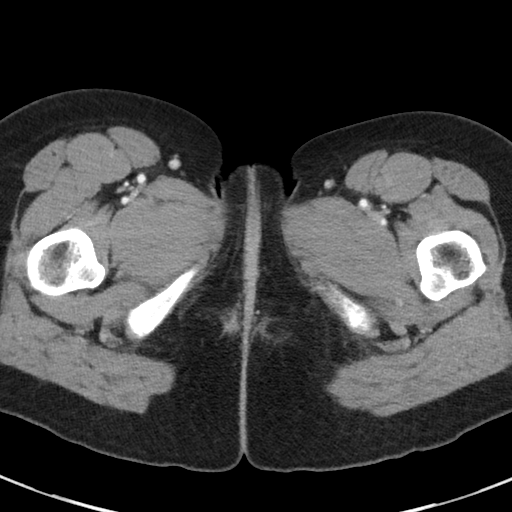
[im 6/91  bone]
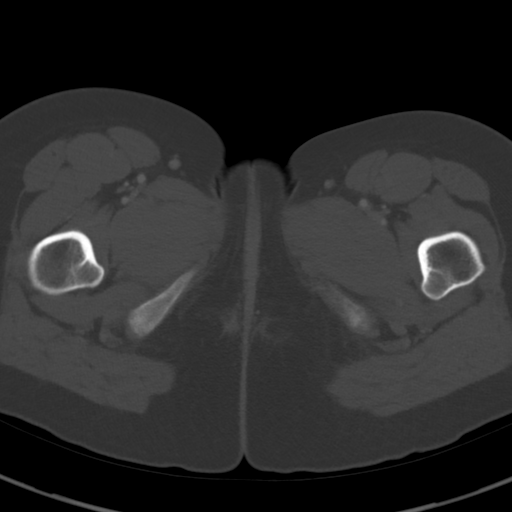
[im 15/91  soft-tissue]
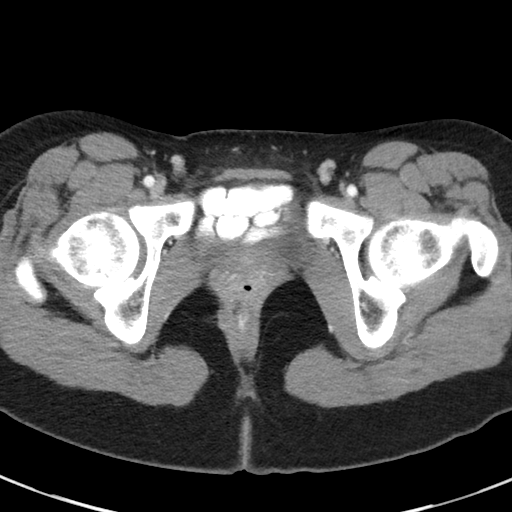
[im 21/91  soft-tissue]
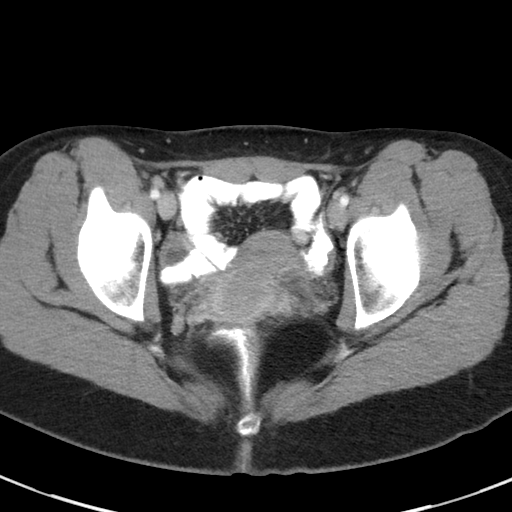
[im 30/91  soft-tissue]
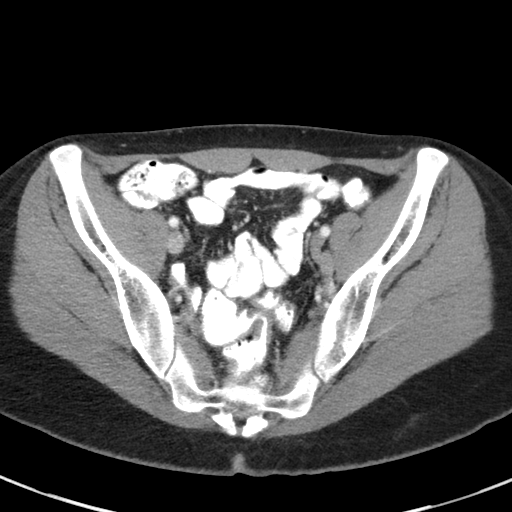
[im 38/91  soft-tissue]
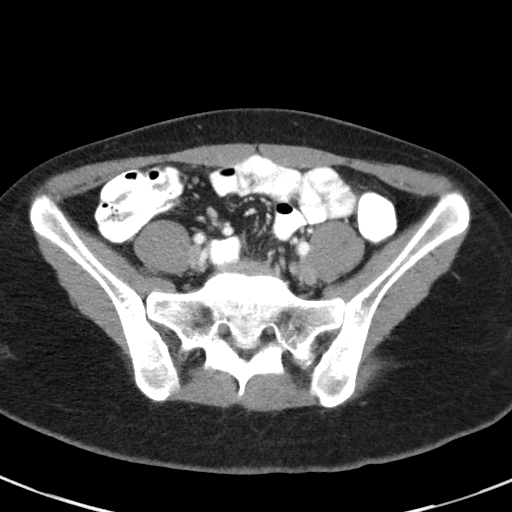
[im 47/91  soft-tissue]
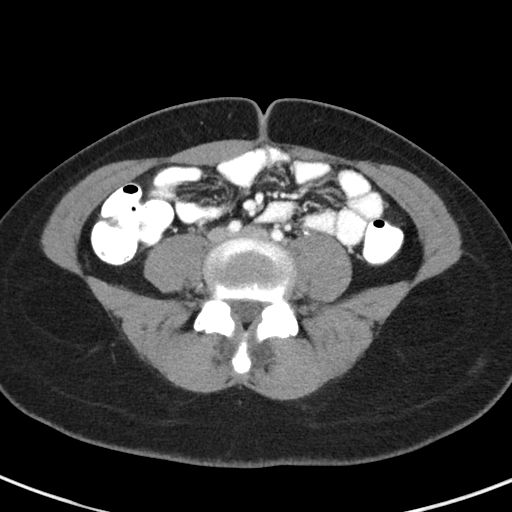
[im 53/91  soft-tissue]
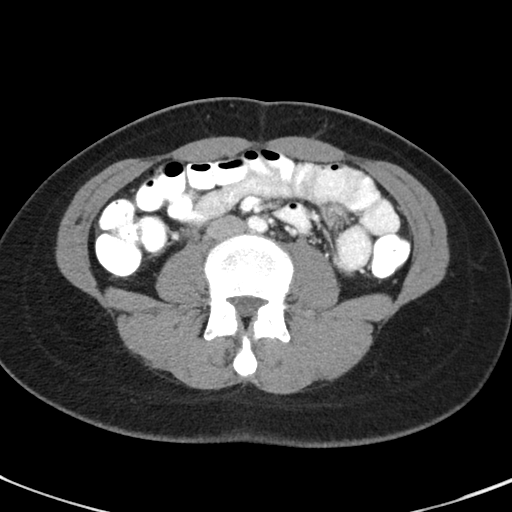
[im 61/91  soft-tissue]
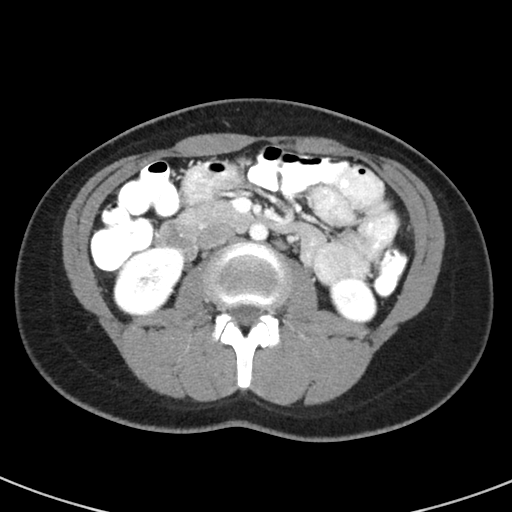
[im 70/91  soft-tissue]
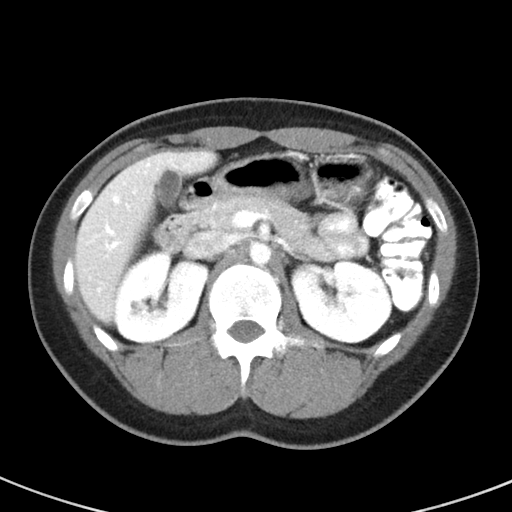
[im 70/91  bone]
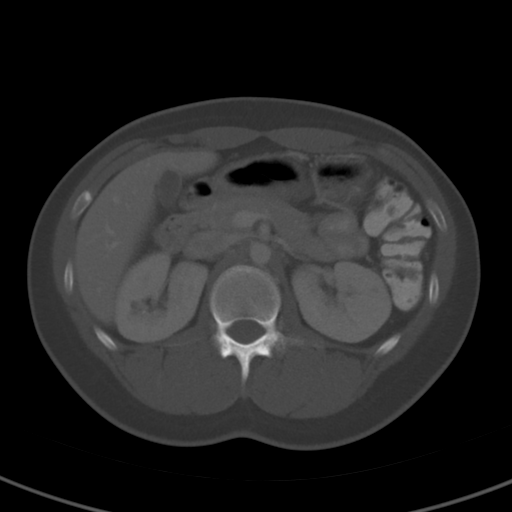
[im 76/91  soft-tissue]
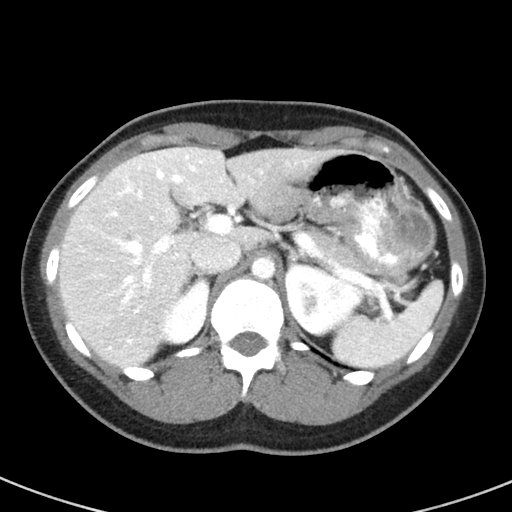
[im 85/91  soft-tissue]
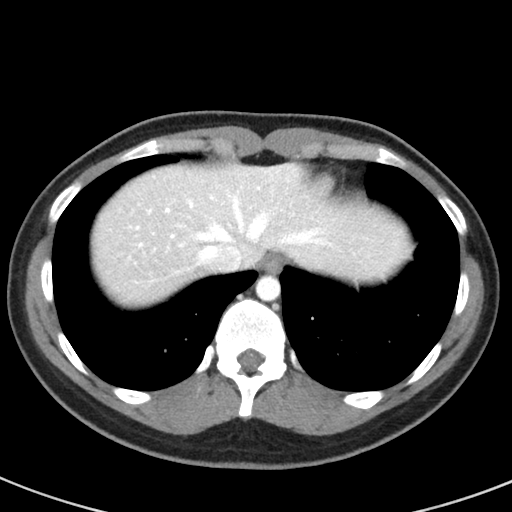

[Series 5: coronal st · coronal · 0.52mm/px · 3 of 59 slices shown]
[im 20/59  soft-tissue]
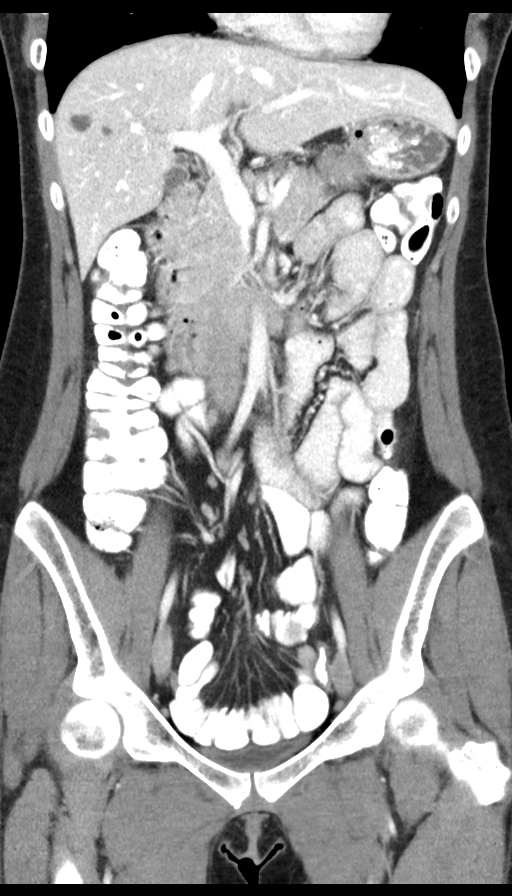
[im 26/59  soft-tissue]
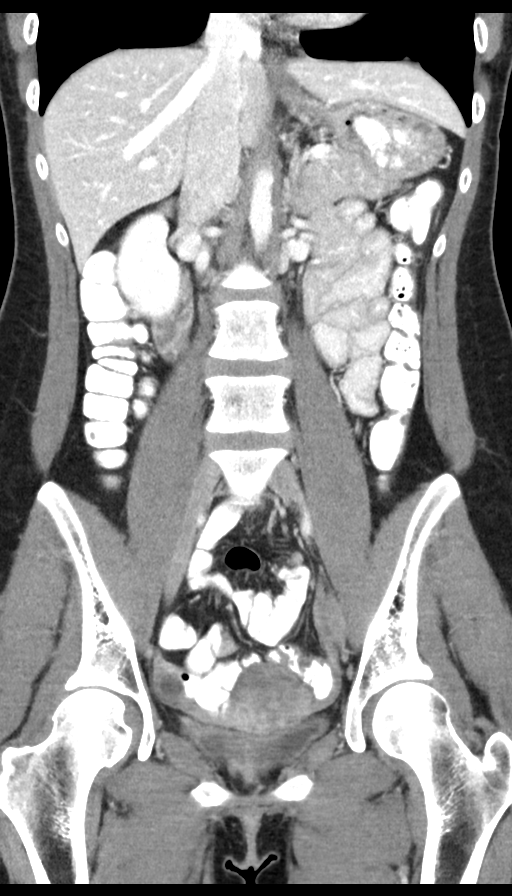
[im 33/59  soft-tissue]
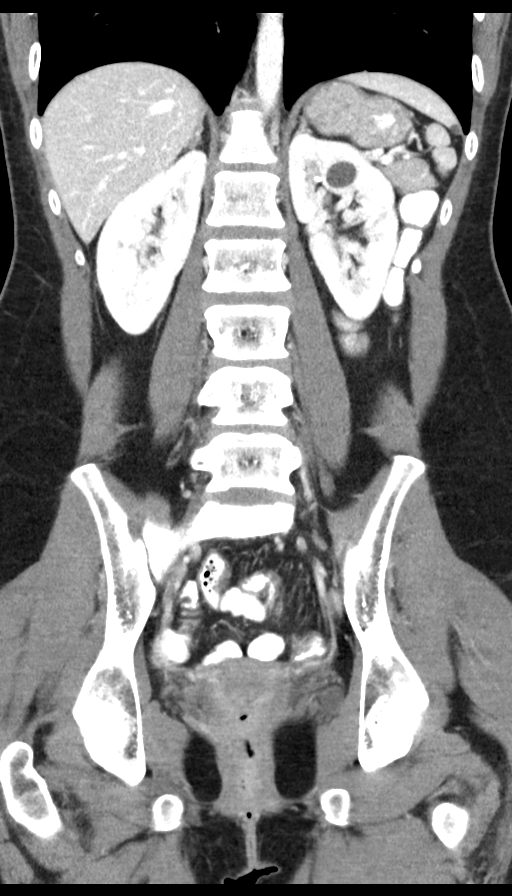

[14 of 46 positions shown; findings below may reference images not displayed]

FINDINGS: Lower chest: Unremarkable

Hepatobiliary: Seven small hypodense hepatic lesions are essentially
stable and are probably cysts, the largest measures 1.3 cm in
diameter on image [DATE] (stable). Gallbladder mildly contracted but
otherwise normal.

Pancreas: Chronic mild prominence of the rightward extent of the
pancreatic head not appreciably changed from 11/02/2007 and without
differential enhancement to suggest an underlying lesion. This is
thought to simply represent normal variation given the
circumstances.

Spleen: Unremarkable

Adrenals/Urinary Tract: Stable left kidney upper pole cyst. Several
other tiny hypodense lesions of the kidneys are technically too
small to characterize although statistically likely to be cysts.
Bilateral nonobstructive nephrolithiasis is again noted, the largest
stone is in the left kidney upper pole and measures 6 mm in long
axis. No hydronephrosis or ureteral calculus is appreciated.

Stomach/Bowel: Relatively unremarkable appearance of the terminal
ileum and cecum. There is some potential wall thickening in proximal
loops of jejunum which may reflect a mild proximal enteritis.
Appendix normal.

Vascular/Lymphatic: Small retroperitoneal lymph nodes are present
but are not pathologically enlarged by size criteria.

Reproductive: Unremarkable

Other: Unremarkable

Musculoskeletal: Unremarkable
IMPRESSION: 1. Mild wall thickening in several proximal jejunal loops suspicious
for nonspecific proximal enteritis. The terminal ileum and cecum
appear normal as does the appendix.
2. Nonobstructive bilateral nephrolithiasis without ureteral
calculus or hydronephrosis.
3. Several small hepatic and renal cysts.

## 2019-09-12 NOTE — Progress Notes (Signed)
09/12/2019                                Endocrinology Telehealth Visit Follow up Note -During COVID -19 Pandemic  I connected with Kendra Franklin on 09/12/2019   by telephone and verified that I am speaking with the correct person using two identifiers. Kendra Franklin, [birth year]. she has verbally consented to this visit. All issues noted in this document were discussed and addressed. The format was not optimal for physical exam.   Subjective:    Patient ID: Kendra Franklin, female    DOB: 1991-11-09, PCP Monico Blitz, MD   Past Medical History:  Diagnosis Date  . Allergy    medicine  . Bloody diarrhea 03/11/2015  . Crohn's    DX   DEC 2011--- ASCA 100 pANCA NEG-APR 2012 6-TGN 233(LLN 230)  6-MMPN 1173 ON 75 MG IMURAN  . Crohn's disease, small and large intestine ([place])   . History of anal fissures   . History of anal lesion    2009   NON--HEALING  . History of kidney stones   . History of small intestine ulcer    2011  . Osteoporosis    Past Surgical History:  Procedure Laterality Date  . COLONOSCOPY  DEC 2011   ILEO-COLONIC ULCERS  . COLONOSCOPY N/A 03/16/2015   [facility]: 1/ The examined terminal ileum appeared to be normal 2. The left colon is redundatnt 3. Rectal bleeding due to small internal hemorrhoids  . COLONOSCOPY N/A 12/26/2017   Procedure: COLONOSCOPY;  Surgeon: Danie Binder, MD;  [facility];  Service: Endoscopy;  Laterality: N/A;  2:15pm  . CYSTOSCOPY WITH RETROGRADE PYELOGRAM, URETEROSCOPY AND STENT PLACEMENT Left 02/27/2014   Procedure: CYSTOSCOPY WITH RETROGRADE PYELOGRAM, URETEROSCOPY/ STENT PLACEMENT;  Surgeon: Alexis Frock, MD;  Location: [facility];  Service: Urology;  Laterality: Left;  . ESOPHAGOGASTRODUODENOSCOPY N/A 03/16/2015   [facility]: 1. epigastric pain most likely due to gastritis/pyschosocial stressors, less likely GERD. 2. Mild non-erosive gastritis.   Marland Kitchen ESOPHAGOGASTRODUODENOSCOPY N/A 12/26/2017   Procedure:  ESOPHAGOGASTRODUODENOSCOPY (EGD);  Surgeon: Danie Binder, MD;  [facility]: [facility];  Service: Endoscopy;  Laterality: N/A;  . FLEXIBLE SIGMOIDOSCOPY N/A 12/11/2013   Procedure: FLEXIBLE SIGMOIDOSCOPY;  Surgeon: Danie Binder, MD;  Location: [facility];  Service: Endoscopy;  Laterality: N/A;  1:45 PM  . GIVENS CAPSULE STUDY  DEC 2011   SB ULCERS  . GIVENS CAPSULE STUDY N/A 01/08/2018   Procedure: GIVENS CAPSULE STUDY;  Surgeon: Danie Binder, MD;  Location: [facility];  Service: Endoscopy;  Laterality: N/A;  7:30am  . HOLMIUM LASER APPLICATION Left 12/03/1094   Procedure: HOLMIUM LASER APPLICATION;  Surgeon: Alexis Frock, MD;  Location[facility];  Service: Urology;  Laterality: Left;  . INCISION AND DRAINAGE PERIRECTAL ABSCESS  2009  &  2010   Social History   Socioeconomic History  . Marital status: Single    Spouse name: Not on file  . Number of children: 0  . Years of education: BSW  . Highest education level: Bachelor's degree (e.g., BA, AB, BS)  Occupational History  . Occupation: Ship broker  Social Needs  . Financial resource strain: Not on file  . Food insecurity    Worry: Not on file    Inability: Not on file  . Transportation needs    Medical: Not on file    Non-medical: Not  on file  Tobacco Use  . Smoking status: Never Smoker  . Smokeless tobacco: Never Used  Substance and Sexual Activity  . Alcohol use: Yes    Comment: RARE  . Drug use: No  . Sexual activity: Not Currently  Lifestyle  . Physical activity    Days per week: Not on file    Minutes per session: Not on file  . Stress: Not on file  Relationships  . Social Herbalist on phone: Not on file    Gets together: Not on file    Attends religious service: Not on file    Active member of club or organization: Not on file    Attends meetings of clubs or organizations: Not on file    Relationship status: Not on file  Other Topics Concern  . Not on file  Social History  Narrative   Graduated from [employer]. Straight A student.    Now at [employer] in Masters of Gerontology program (as of Oct 6433)     Mother: Arlyss Repress. No siblings. Mother smokes.   Lives at home w/ her mother   Right-handed   Caffeine: denies   Outpatient Encounter Medications as of 09/12/2019  Medication Sig  . Cholecalciferol (VITAMIN D3) 125 MCG (5000 UT) CAPS Take 1 capsule by mouth daily at 2 PM.  . alendronate (FOSAMAX) 70 MG tablet TAKE 1 TABLET (70 MG TOTAL) BY MOUTH EVERY 7 (SEVEN) DAYS. TAKE WITH A FULL GLASS OF WATER ON AN EMPTY STOMACH.  Marland Kitchen azaTHIOprine (IMURAN) 50 MG tablet Take 2 tablets (100 mg total) by mouth daily.  . ciprofloxacin (CIPRO) 500 MG tablet 1 PO BID FOR 3 DAYS  . HYDROcodone-acetaminophen (NORCO) 5-325 MG tablet Take 1 tablet by mouth every 6 (six) hours as needed for moderate pain.  Marland Kitchen HYDROcodone-acetaminophen (NORCO/VICODIN) 5-325 MG tablet HALF TO ONE EVERY 4-6 H PRN FOR PAIN  . inFLIXimab (REMICADE) 100 MG injection Inject 300 mg into the vein every 6 (six) weeks. LAST INJECTION NOV 2017  . ondansetron (ZOFRAN) 4 MG tablet Take 1 tablet (4 mg total) by mouth every 8 (eight) hours as needed for nausea or vomiting.  . polyethylene glycol powder (GLYCOLAX/MIRALAX) powder MIX 17 GRAMS (MARKED ON CAP) WITH LIQUID AND DRINK ONCE DAILY AS NEEDED (Patient taking differently: MIX 17 GRAMS (MARKED ON CAP) WITH LIQUID AND DRINK ONCE DAILY AS NEEDED FOR CONTSTIPATION)  . promethazine (PHENERGAN) 6.25 MG/5ML syrup Take 5 mLs (6.25 mg total) by mouth every 6 (six) hours as needed for nausea, vomiting or refractory nausea / vomiting.  . valACYclovir (VALTREX) 1000 MG tablet Take 1,000 mg by mouth daily.   No facility-administered encounter medications on file as of 09/12/2019.    ALLERGIES: Allergies  Allergen Reactions  . Morphine Hives  . Other Rash    Coban causes bruising and rash    VACCINATION STATUS: There is no immunization history for the selected  administration types on file for this patient.  HPI  28 year old female patient with medical history as above. She is being engaged in telehealth via telephone in follow-up for osteoporosis.   - She is known to have Crohn's disease currently on therapy azathioprine and Remicade. She follows regularly in gastroenterology with [name]. - She has required some steroid therapy in the past but appears to be very minimal exposure. - She underwent bone density study on 12/07/2016 which showed Z score of -2.9 on Dual Femur and -0.4 on spine. -She was initiated on Fosamax  70 mg p.o. weekly during her last visit in April 2018.  She continues to tolerate this medication with no reported side effects.  However she admits that she has not been consistent taking the medication. Her most recent bone density in March 2020 showed mild to moderate response to treatment with that T score on L1-L4 was -0.4 slightly improving from -0.5, on dual femur left was -2.8 slightly improving from -2.9.  The mean Daul Femur T score got worse from -2.2 to -2.3.    - She denies personal history of fractures. She denies family history of Crohn's disease nor osteoporosis. - She does not involve in contact sports. - She wishes to resume  Depo-Provera for contraceptive purposes.  -She is taking multivitamins. She denies any history of thyroid/parathyroid dysfunction. - She is currently in masters program for gerontology.  In the interval, she had unexplained weight loss of 20 pounds, however she reports poor appetite made worse by stress/anxiety.   Review of Systems  Limited as above.  Objective:    There were no vitals taken for this visit.  Wt Readings from Last 3 Encounters:  08/16/19 115 lb 1.3 oz (52.2 kg)  07/05/19 115 lb 1.3 oz (52.2 kg)  05/24/19 115 lb (52.2 kg)    Physical Exam   CMP ( most recent) CMP     Component Value Date/Time   NA 141 01/30/2019 1016   K 3.9 01/30/2019 1016   CL 109 01/30/2019  1016   CO2 23 01/30/2019 1016   GLUCOSE 88 01/30/2019 1016   BUN 9 01/30/2019 1016   CREATININE 0.78 01/30/2019 1016   CALCIUM 9.8 01/30/2019 1016   PROT 7.8 01/30/2019 1016   ALBUMIN 4.3 06/01/2018 1245   AST 18 01/30/2019 1016   ALT 8 01/30/2019 1016   ALKPHOS 45 06/01/2018 1245   BILITOT 0.8 01/30/2019 1016   GFRNONAA 105 01/30/2019 1016   GFRAA 122 01/30/2019 1016    No results found for this or any previous visit (from the past 2160 hour(s)).  Bone densitometry from 12/07/2016 is reviewed.  Assessment & Plan:   1. Osteoporosis -She continues to tolerate Fosamax 70 mg p.o. Weekly. -Her serum CTX and P1 NP-stable and within normal limits.  - She has multiple risk factors for osteoporosis including Crohn's disease. - She does not give history significant for heavy steroid exposure. -  Her workup for common secondary cause of osteoporosis are negative including hyperparathyroidism/hyperthyroidism .  - As osteoporosis in premenopausal woman is not diagnosed based on bone densitometry alone, Given the significantly decreased bone mass on bilateral hips with Z score of -2.8 and active Crohn's disease , she remains at risk for fractures.    - It is not clear if she ever achieved optimal bone mass , it is possible that she never achieved a peak bone mass prior to her diagnosis with osteoporosis. -Considering all the circumstances, she plans to continue to benefit from bisphosphonate therapy with adequate vitamin D and calcium supplements.      -I have reemphasized the need for treatment,  discussed with her pathology and therapeutic options of osteoporosis.   She agrees with my advised to continue  continue  alendronate 70 mg by mouth weekly. Side effects and precautions discussed with her. - She will also be continued on a regular vitamin D 3 5000 units daily, along with over-the-counter calcium supplements. - She will have her repeat bone density in March 2022  to assess effect  of therapy. -  She is  advised to avoid strenuous exercise including contact sports however would benefit from weight bearing exercises including resistance cord, swimming, walking. - This measure is important to improve her posture and decrease her risk of falls/fractures.  Regarding her contraceptive choices: Depo use is known to cause reversible decrease in BMD, however unlikely to increase her risk of fracture.   Contraceptive implant would have been a better choice for her, however she prefers the Depo.    - I advised patient to maintain close follow up with her PCP primary care needs.   Time for this visit: 15 minutes. Kendra Franklin  participated in the discussions, expressed understanding, and voiced agreement with the above plans.  All questions were answered to her satisfaction. she is encouraged to contact clinic should she have any questions or concerns prior to her return visit.  Follow up plan: Return in about 6 months (around 03/12/2020) for Follow up with Pre-visit Labs.  Glade Lloyd, MD Phone: [phone]  Fax: [phone]   09/12/2019, 2:21 PM

## 2019-09-27 ENCOUNTER — Encounter (HOSPITAL_COMMUNITY)
Admission: RE | Admit: 2019-09-27 | Discharge: 2019-09-27 | Disposition: A | Discharge status: Home / Self Care | Payer: Medicare Other | Source: Ambulatory Visit | Attending: Gastroenterology | Admitting: Gastroenterology

## 2019-09-27 ENCOUNTER — Encounter (HOSPITAL_COMMUNITY): Payer: Self-pay

## 2019-09-27 ENCOUNTER — Other Ambulatory Visit: Payer: Self-pay

## 2019-09-27 DIAGNOSIS — K5090 Crohn's disease, unspecified, without complications: Secondary | ICD-10-CM | POA: Diagnosis not present

## 2019-09-27 MED ORDER — ACETAMINOPHEN 325 MG PO TABS
650.0000 mg | ORAL_TABLET | Freq: Once | ORAL | Status: AC
  Administered 2019-09-27: 650 mg via ORAL

## 2019-09-27 MED ORDER — LORATADINE 10 MG PO TABS
10.0000 mg | ORAL_TABLET | Freq: Every day | ORAL | Status: DC
  Administered 2019-09-27: 10 mg via ORAL

## 2019-09-27 MED ORDER — SODIUM CHLORIDE 0.9 % IV SOLN
Freq: Once | INTRAVENOUS | Status: AC
  Administered 2019-09-27: 10:00:00 via INTRAVENOUS

## 2019-09-27 MED ORDER — SODIUM CHLORIDE 0.9 % IV SOLN
300.0000 mg | Freq: Once | INTRAVENOUS | Status: AC
  Administered 2019-09-27: 300 mg via INTRAVENOUS
  Filled 2019-09-27: qty 30

## 2019-10-01 DIAGNOSIS — T380X5A Adverse effect of glucocorticoids and synthetic analogues, initial encounter: Secondary | ICD-10-CM | POA: Diagnosis not present

## 2019-10-01 DIAGNOSIS — M818 Other osteoporosis without current pathological fracture: Secondary | ICD-10-CM | POA: Diagnosis not present

## 2019-10-02 LAB — COMPLETE METABOLIC PANEL WITH GFR
AG Ratio: 1.2 (calc) (ref 1.0–2.5)
ALT: 9 U/L (ref 6–29)
AST: 18 U/L (ref 10–30)
Albumin: 4.2 g/dL (ref 3.6–5.1)
Alkaline phosphatase (APISO): 45 U/L (ref 31–125)
BUN: 11 mg/dL (ref 7–25)
CO2: 22 mmol/L (ref 20–32)
Calcium: 9.9 mg/dL (ref 8.6–10.2)
Chloride: 109 mmol/L (ref 98–110)
Creat: 0.72 mg/dL (ref 0.50–1.10)
GFR, Est African American: 133 mL/min/{1.73_m2} (ref >=60)
GFR, Est Non African American: 115 mL/min/{1.73_m2} (ref >=60)
Globulin: 3.4 g/dL (calc) (ref 1.9–3.7)
Glucose, Bld: 99 mg/dL (ref 65–139)
Potassium: 3.8 mmol/L (ref 3.5–5.3)
Sodium: 140 mmol/L (ref 135–146)
Total Bilirubin: 0.7 mg/dL (ref 0.2–1.2)
Total Protein: 7.6 g/dL (ref 6.1–8.1)

## 2019-10-02 LAB — VITAMIN D 25 HYDROXY (VIT D DEFICIENCY, FRACTURES): Vit D, 25-Hydroxy: 26 ng/mL — ABNORMAL LOW (ref 30–100)

## 2019-11-04 ENCOUNTER — Telehealth: Payer: Self-pay

## 2019-11-04 NOTE — Telephone Encounter (Signed)
Pt said her appointment at [facility] is not until 11/12/2019.  She experimented with the Imuran to see if her abdominal pain would improve.  She went off Imuran for 2 weeks and then back on Imuran for 2 weeks and did it like that for awhile.  She said she had less abdominal pain when she was off of the Imuran.  She is asking if [name] will consider decreasing the dosage on the Imuran.  [name], please advise!

## 2019-11-05 DIAGNOSIS — B009 Herpesviral infection, unspecified: Secondary | ICD-10-CM | POA: Diagnosis not present

## 2019-11-05 DIAGNOSIS — N898 Other specified noninflammatory disorders of vagina: Secondary | ICD-10-CM | POA: Diagnosis not present

## 2019-11-05 DIAGNOSIS — K50913 Crohn's disease, unspecified, with fistula: Secondary | ICD-10-CM | POA: Diagnosis not present

## 2019-11-06 NOTE — Telephone Encounter (Signed)
Left Vm for a return call.

## 2019-11-06 NOTE — Telephone Encounter (Signed)
PLEASE CALL PT. SHE CAN HOLD IMURAN FOR ONE MONTH. WE MAY NEED TO CONSIDER ANOTHER MEDICATION TO CONTROL HER CROHN'S IF SHE BECOMES SYMPTOMATIC ON ENTYVIO.

## 2019-11-06 NOTE — Telephone Encounter (Signed)
Pt is aware.  

## 2019-11-08 ENCOUNTER — Encounter (HOSPITAL_COMMUNITY)
Admission: RE | Admit: 2019-11-08 | Discharge: 2019-11-08 | Disposition: A | Discharge status: Home / Self Care | Payer: Medicare Other | Source: Ambulatory Visit | Attending: Gastroenterology | Admitting: Gastroenterology

## 2019-11-08 DIAGNOSIS — K5090 Crohn's disease, unspecified, without complications: Secondary | ICD-10-CM | POA: Insufficient documentation

## 2019-11-14 ENCOUNTER — Other Ambulatory Visit: Payer: Self-pay

## 2019-11-14 ENCOUNTER — Encounter (HOSPITAL_COMMUNITY)
Admission: RE | Admit: 2019-11-14 | Discharge: 2019-11-14 | Disposition: A | Discharge status: Home / Self Care | Payer: Medicare Other | Source: Ambulatory Visit | Attending: Gastroenterology | Admitting: Gastroenterology

## 2019-11-14 ENCOUNTER — Encounter (HOSPITAL_COMMUNITY): Payer: Self-pay

## 2019-11-14 DIAGNOSIS — K5090 Crohn's disease, unspecified, without complications: Secondary | ICD-10-CM | POA: Diagnosis present

## 2019-11-14 MED ORDER — ACETAMINOPHEN 325 MG PO TABS
650.0000 mg | ORAL_TABLET | Freq: Once | ORAL | Status: AC
  Administered 2019-11-14: 650 mg via ORAL

## 2019-11-14 MED ORDER — LORATADINE 10 MG PO TABS
10.0000 mg | ORAL_TABLET | Freq: Once | ORAL | Status: AC
  Administered 2019-11-14: 10 mg via ORAL

## 2019-11-14 MED ORDER — SODIUM CHLORIDE 0.9 % IV SOLN
300.0000 mg | Freq: Once | INTRAVENOUS | Status: AC
  Administered 2019-11-14: 10:00:00 300 mg via INTRAVENOUS
  Filled 2019-11-14: qty 30

## 2019-11-14 MED ORDER — SODIUM CHLORIDE 0.9 % IV SOLN: Freq: Once | INTRAVENOUS | Status: AC

## 2019-11-26 ENCOUNTER — Telehealth: Payer: Self-pay

## 2019-11-26 MED ORDER — VITAMIN D (ERGOCALCIFEROL) 1.25 MG (50000 UNIT) PO CAPS: 50000.0000 [IU] | ORAL_CAPSULE | ORAL | 0 refills | Status: DC

## 2019-11-26 NOTE — Addendum Note (Signed)
Addended by: Danie Binder on: 11/26/2019 05:32 PM   Modules accepted: Orders

## 2019-11-26 NOTE — Telephone Encounter (Addendum)
PLEASE CALL PT. I REVIEWED THE NOTE FROM [facility]. SHE CAN HOLD IMURAN. CONTINUE REMICADE Q6 WEEKS. SHE NEEDS TO HAVE REMICADE TROUGH LEVEL 24 HRS PRIOR TO HER NEXT DOSE IN FEB 2021. SHE NEEDS TO HAVE A VITAMIN B12 LEVEL CHECKED.  HER VITAMIN D LEVEL WAS LOW IN NOV 2020. SHE NEEDS SUPPLEMENTS: VITAMIN D AND CALCIUM. SHE NEEDS VITAMIN D 50,000 UNITS A WEEK FOR 8 WEEKS. HOLD HER CURRENT VITAMIN D PILL WHILE TAKING THE ONCE A WEEK DOSING. THEN RE-START VITAMIN D 5000 UNITS DAILY FOREVER. SHE NEEDS TO START CALCIUM 500 MG THREE TIMES A DAY FOREVER.   OTHERWISE IF SHE FEELS BETTER THERE IS NO NEED TO ADD ADDITIONAL MEDICATIONS AT THIS TIME.

## 2019-11-26 NOTE — Telephone Encounter (Signed)
LMOM to call.

## 2019-11-26 NOTE — Telephone Encounter (Signed)
Pt called and said she was seen at [facility] on Dec 15th. She said the doctor needs [name] to review and prescribe as she feels appropriate. Pt also said she has been off of the Imuran almost a month and has been doing well.  She wants to know should she go back on it. [name], please advise!

## 2019-11-27 ENCOUNTER — Other Ambulatory Visit: Payer: Self-pay | Admitting: Gastroenterology

## 2019-11-27 DIAGNOSIS — K50913 Crohn's disease, unspecified, with fistula: Secondary | ICD-10-CM

## 2019-11-28 ENCOUNTER — Other Ambulatory Visit: Payer: Self-pay

## 2019-11-28 DIAGNOSIS — K50913 Crohn's disease, unspecified, with fistula: Secondary | ICD-10-CM

## 2019-11-28 NOTE — Telephone Encounter (Signed)
Pt is aware of the plan and agrees.  She will do the Vit B12 lab when she does the Remicade trough.  Paperwork for the Remicade trough on [name] desk to sign and return to me.  Pt is aware I will call her when it is ready and she is aware she will need it the day before next Remicade infusion.

## 2019-11-28 NOTE — Telephone Encounter (Signed)
Ok to have B12 AND REMICADE TROUGH CHECKED AT [place].

## 2019-12-06 NOTE — Telephone Encounter (Signed)
Lmom, waiting on a return call. Lab orders are ready for pickup. Orders were given by [facility]. [facility] labs are also requested by [facility]. Kit and lab orders must be taken to [facility] lab per [facility].

## 2019-12-06 NOTE — Telephone Encounter (Signed)
Pt returned call. Pt will pickup lab orders and kit. Pt is aware that labs should be drawn 1 day before Remicade. Pt also said DS was suppose to send letter of instructions of vitamins per [facility]. Letter is ready for pickup. Pt will pick up letter and lab orders on Tuesday 12/10/2019.

## 2019-12-18 ENCOUNTER — Telehealth: Payer: Self-pay

## 2019-12-18 MED ORDER — MIRTAZAPINE 7.5 MG PO TABS: ORAL_TABLET | ORAL | 5 refills | Status: DC

## 2019-12-18 NOTE — Telephone Encounter (Signed)
PT called and said she has been having migraines since she has been off of the Imuran. She had migraine for 7 days in a row and she can only take Tylenol and that does not touch it. She is also concerned because she has lost 3 more pounds and now weighs 102 lbs. She wants to know if [name] will give her something for her appetite. [name], please advise!

## 2019-12-18 NOTE — Telephone Encounter (Signed)
PLEASE CALL PT. TO STIMULATE HER APPETITE  THE RX WAS SENT FOR MIRTAZAPINE PER RECOMMENDATION OF DR. Daphane Shepherd AT [facility]. SHE SHOULD TAKE 1 PO QHS FOR 7 DAYS THEN 2 PO QHS.   SHE SHOULD SEE DR. Zena TO MANAGE HER MIGRAINES. I DO NOT THINK IT IS RELATED TO STOPPING THE IMURAN.  PLEASE CALL WITH QUESTIONS OR CONCERNS.

## 2019-12-18 NOTE — Addendum Note (Signed)
Addended by: Danie Binder on: 12/18/2019 03:04 PM   Modules accepted: Orders

## 2019-12-18 NOTE — Telephone Encounter (Signed)
Opened in error

## 2019-12-18 NOTE — Telephone Encounter (Signed)
PT said she is only taking the following medications: Valtrex, Fosamax, Vit D weekly and Miralax only as needed.

## 2019-12-18 NOTE — Telephone Encounter (Signed)
LMOM to call.

## 2019-12-18 NOTE — Telephone Encounter (Signed)
Pt is aware of the recommendations.

## 2019-12-18 NOTE — Telephone Encounter (Signed)
PLEASE CALL PT. TRIAGE HER MEDS THEN I CAN SEND SOMETHING IN FOR APPETITE SUPPRESSION.

## 2019-12-20 DIAGNOSIS — Z713 Dietary counseling and surveillance: Secondary | ICD-10-CM | POA: Diagnosis not present

## 2019-12-20 DIAGNOSIS — Z299 Encounter for prophylactic measures, unspecified: Secondary | ICD-10-CM | POA: Diagnosis not present

## 2019-12-20 DIAGNOSIS — Z681 Body mass index (BMI) 19 or less, adult: Secondary | ICD-10-CM | POA: Diagnosis not present

## 2019-12-20 DIAGNOSIS — R519 Headache, unspecified: Secondary | ICD-10-CM | POA: Diagnosis not present

## 2019-12-20 DIAGNOSIS — K5090 Crohn's disease, unspecified, without complications: Secondary | ICD-10-CM | POA: Diagnosis not present

## 2019-12-24 DIAGNOSIS — K5090 Crohn's disease, unspecified, without complications: Secondary | ICD-10-CM | POA: Diagnosis not present

## 2019-12-24 DIAGNOSIS — R634 Abnormal weight loss: Secondary | ICD-10-CM | POA: Diagnosis not present

## 2019-12-24 DIAGNOSIS — Z713 Dietary counseling and surveillance: Secondary | ICD-10-CM | POA: Diagnosis not present

## 2019-12-24 DIAGNOSIS — Z789 Other specified health status: Secondary | ICD-10-CM | POA: Diagnosis not present

## 2019-12-24 DIAGNOSIS — Z681 Body mass index (BMI) 19 or less, adult: Secondary | ICD-10-CM | POA: Diagnosis not present

## 2019-12-24 DIAGNOSIS — Z299 Encounter for prophylactic measures, unspecified: Secondary | ICD-10-CM | POA: Diagnosis not present

## 2019-12-26 ENCOUNTER — Encounter (HOSPITAL_COMMUNITY): Payer: Medicare Other

## 2020-01-03 ENCOUNTER — Encounter (HOSPITAL_COMMUNITY): Payer: Self-pay

## 2020-01-03 ENCOUNTER — Other Ambulatory Visit: Payer: Self-pay

## 2020-01-03 ENCOUNTER — Encounter (HOSPITAL_COMMUNITY)
Admission: RE | Admit: 2020-01-03 | Discharge: 2020-01-03 | Disposition: A | Discharge status: Home / Self Care | Payer: Medicare Other | Source: Ambulatory Visit | Attending: Gastroenterology | Admitting: Gastroenterology

## 2020-01-03 ENCOUNTER — Encounter: Payer: Self-pay | Admitting: Gastroenterology

## 2020-01-03 DIAGNOSIS — K5090 Crohn's disease, unspecified, without complications: Secondary | ICD-10-CM | POA: Diagnosis not present

## 2020-01-03 DIAGNOSIS — K50913 Crohn's disease, unspecified, with fistula: Secondary | ICD-10-CM | POA: Diagnosis not present

## 2020-01-03 LAB — VITAMIN B12: Vitamin B-12: 166 pg/mL — ABNORMAL LOW (ref 180–914)

## 2020-01-03 MED ORDER — LORATADINE 10 MG PO TABS
10.0000 mg | ORAL_TABLET | Freq: Once | ORAL | Status: AC
  Administered 2020-01-03: 09:00:00 10 mg via ORAL

## 2020-01-03 MED ORDER — SODIUM CHLORIDE 0.9 % IV SOLN
300.0000 mg | Freq: Once | INTRAVENOUS | Status: AC
  Administered 2020-01-03: 09:00:00 300 mg via INTRAVENOUS
  Filled 2020-01-03: qty 30

## 2020-01-03 MED ORDER — SODIUM CHLORIDE 0.9 % IV SOLN: Freq: Once | INTRAVENOUS | Status: AC

## 2020-01-03 MED ORDER — ACETAMINOPHEN 325 MG PO TABS
650.0000 mg | ORAL_TABLET | Freq: Once | ORAL | Status: AC
  Administered 2020-01-03: 650 mg via ORAL

## 2020-01-10 ENCOUNTER — Telehealth: Payer: Self-pay | Admitting: Gastroenterology

## 2020-01-10 DIAGNOSIS — Z681 Body mass index (BMI) 19 or less, adult: Secondary | ICD-10-CM | POA: Diagnosis not present

## 2020-01-10 DIAGNOSIS — N200 Calculus of kidney: Secondary | ICD-10-CM | POA: Diagnosis not present

## 2020-01-10 DIAGNOSIS — Z713 Dietary counseling and surveillance: Secondary | ICD-10-CM | POA: Diagnosis not present

## 2020-01-10 DIAGNOSIS — Z299 Encounter for prophylactic measures, unspecified: Secondary | ICD-10-CM | POA: Diagnosis not present

## 2020-01-10 DIAGNOSIS — M549 Dorsalgia, unspecified: Secondary | ICD-10-CM | POA: Diagnosis not present

## 2020-01-10 NOTE — Telephone Encounter (Signed)
Pt is aware to contact PCP.

## 2020-01-10 NOTE — Telephone Encounter (Signed)
Pt called asking if SF would call something in for pain because she was passing a kidney stone. [facility] [place]. Please advise [phone]

## 2020-01-10 NOTE — Telephone Encounter (Signed)
PLEASE CALL PT. SHE SHOULD SEE HER PCP FOR PAIN MEDS FROM A KIDNEY STONE.

## 2020-01-12 ENCOUNTER — Other Ambulatory Visit: Payer: Self-pay | Admitting: Gastroenterology

## 2020-01-21 ENCOUNTER — Other Ambulatory Visit: Payer: Self-pay | Admitting: Gastroenterology

## 2020-01-22 DIAGNOSIS — N200 Calculus of kidney: Secondary | ICD-10-CM | POA: Diagnosis not present

## 2020-01-22 DIAGNOSIS — R1084 Generalized abdominal pain: Secondary | ICD-10-CM | POA: Diagnosis not present

## 2020-01-22 DIAGNOSIS — N202 Calculus of kidney with calculus of ureter: Secondary | ICD-10-CM | POA: Diagnosis not present

## 2020-01-22 DIAGNOSIS — N132 Hydronephrosis with renal and ureteral calculous obstruction: Secondary | ICD-10-CM | POA: Diagnosis not present

## 2020-02-06 DIAGNOSIS — Z01419 Encounter for gynecological examination (general) (routine) without abnormal findings: Secondary | ICD-10-CM | POA: Diagnosis not present

## 2020-02-06 DIAGNOSIS — Z789 Other specified health status: Secondary | ICD-10-CM | POA: Diagnosis not present

## 2020-02-06 DIAGNOSIS — R519 Headache, unspecified: Secondary | ICD-10-CM | POA: Diagnosis not present

## 2020-02-06 DIAGNOSIS — Z299 Encounter for prophylactic measures, unspecified: Secondary | ICD-10-CM | POA: Diagnosis not present

## 2020-02-06 DIAGNOSIS — Z124 Encounter for screening for malignant neoplasm of cervix: Secondary | ICD-10-CM | POA: Diagnosis not present

## 2020-02-11 ENCOUNTER — Other Ambulatory Visit: Payer: Self-pay | Admitting: "Endocrinology

## 2020-02-14 ENCOUNTER — Encounter (HOSPITAL_COMMUNITY): Admission: RE | Admit: 2020-02-14 | Payer: Medicare Other | Source: Ambulatory Visit

## 2020-02-17 ENCOUNTER — Encounter (HOSPITAL_COMMUNITY): Payer: Self-pay

## 2020-02-17 ENCOUNTER — Other Ambulatory Visit: Payer: Self-pay

## 2020-02-17 ENCOUNTER — Encounter (HOSPITAL_COMMUNITY)
Admission: RE | Admit: 2020-02-17 | Discharge: 2020-02-17 | Disposition: A | Discharge status: Home / Self Care | Payer: Medicare Other | Source: Ambulatory Visit | Attending: Gastroenterology | Admitting: Gastroenterology

## 2020-02-17 DIAGNOSIS — K5090 Crohn's disease, unspecified, without complications: Secondary | ICD-10-CM | POA: Diagnosis not present

## 2020-02-17 MED ORDER — LORATADINE 10 MG PO TABS
10.0000 mg | ORAL_TABLET | Freq: Once | ORAL | Status: AC
  Administered 2020-02-17: 10 mg via ORAL

## 2020-02-17 MED ORDER — SODIUM CHLORIDE 0.9 % IV SOLN
300.0000 mg | Freq: Once | INTRAVENOUS | Status: AC
  Administered 2020-02-17: 300 mg via INTRAVENOUS
  Filled 2020-02-17: qty 30

## 2020-02-17 MED ORDER — SODIUM CHLORIDE 0.9 % IV SOLN: Freq: Once | INTRAVENOUS | Status: AC

## 2020-02-17 MED ORDER — ACETAMINOPHEN 325 MG PO TABS
650.0000 mg | ORAL_TABLET | Freq: Once | ORAL | Status: AC
  Administered 2020-02-17: 650 mg via ORAL

## 2020-02-25 DIAGNOSIS — E538 Deficiency of other specified B group vitamins: Secondary | ICD-10-CM | POA: Diagnosis not present

## 2020-02-25 DIAGNOSIS — K50913 Crohn's disease, unspecified, with fistula: Secondary | ICD-10-CM | POA: Diagnosis not present

## 2020-02-25 DIAGNOSIS — R5383 Other fatigue: Secondary | ICD-10-CM | POA: Diagnosis not present

## 2020-02-25 DIAGNOSIS — R87613 High grade squamous intraepithelial lesion on cytologic smear of cervix (HGSIL): Secondary | ICD-10-CM | POA: Diagnosis not present

## 2020-03-10 ENCOUNTER — Telehealth: Payer: Self-pay | Admitting: Gastroenterology

## 2020-03-10 NOTE — Telephone Encounter (Signed)
Patient on May recall for b12 level

## 2020-03-11 ENCOUNTER — Other Ambulatory Visit: Payer: Self-pay | Admitting: Emergency Medicine

## 2020-03-11 DIAGNOSIS — Z79899 Other long term (current) drug therapy: Secondary | ICD-10-CM

## 2020-03-11 DIAGNOSIS — R112 Nausea with vomiting, unspecified: Secondary | ICD-10-CM

## 2020-03-11 NOTE — Telephone Encounter (Signed)
Pt needs B12 LEVEL IN MAY.

## 2020-03-11 NOTE — Telephone Encounter (Signed)
Pt had b12 level checked for remoncede do they need a recent one?

## 2020-03-11 NOTE — Telephone Encounter (Signed)
[facility] mailed to pt

## 2020-03-13 ENCOUNTER — Ambulatory Visit: Payer: Medicare Other | Admitting: "Endocrinology

## 2020-03-19 DIAGNOSIS — R87613 High grade squamous intraepithelial lesion on cytologic smear of cervix (HGSIL): Secondary | ICD-10-CM | POA: Diagnosis not present

## 2020-04-10 ENCOUNTER — Other Ambulatory Visit: Payer: Self-pay

## 2020-04-10 ENCOUNTER — Encounter (HOSPITAL_COMMUNITY)
Admission: RE | Admit: 2020-04-10 | Discharge: 2020-04-10 | Disposition: A | Discharge status: Home / Self Care | Payer: Medicare Other | Source: Ambulatory Visit | Attending: Gastroenterology | Admitting: Gastroenterology

## 2020-04-10 DIAGNOSIS — K5090 Crohn's disease, unspecified, without complications: Secondary | ICD-10-CM | POA: Insufficient documentation

## 2020-04-10 MED ORDER — SODIUM CHLORIDE 0.9 % IV SOLN: Freq: Once | INTRAVENOUS | Status: AC

## 2020-04-10 MED ORDER — ACETAMINOPHEN 325 MG PO TABS
650.0000 mg | ORAL_TABLET | Freq: Once | ORAL | Status: AC
  Administered 2020-04-10: 650 mg via ORAL

## 2020-04-10 MED ORDER — SODIUM CHLORIDE 0.9 % IV SOLN
300.0000 mg | Freq: Once | INTRAVENOUS | Status: AC
  Administered 2020-04-10: 300 mg via INTRAVENOUS
  Filled 2020-04-10: qty 30

## 2020-04-10 MED ORDER — LORATADINE 10 MG PO TABS
10.0000 mg | ORAL_TABLET | Freq: Once | ORAL | Status: AC
  Administered 2020-04-10: 10 mg via ORAL

## 2020-04-10 NOTE — Progress Notes (Signed)
Pt states the only meds she is taking is Vitamin D gummies and Remicade.

## 2020-04-12 ENCOUNTER — Other Ambulatory Visit: Payer: Self-pay | Admitting: Gastroenterology

## 2020-05-18 ENCOUNTER — Other Ambulatory Visit: Payer: Self-pay

## 2020-05-18 NOTE — Telephone Encounter (Signed)
Orders were signed by AB and faxed by LL on 05/15/20 to short stay. Orders will be scanned in chart.

## 2020-05-22 ENCOUNTER — Encounter (HOSPITAL_COMMUNITY)
Admission: RE | Admit: 2020-05-22 | Discharge: 2020-05-22 | Disposition: A | Discharge status: Home / Self Care | Payer: Medicare Other | Source: Ambulatory Visit | Attending: Gastroenterology | Admitting: Gastroenterology

## 2020-05-22 ENCOUNTER — Other Ambulatory Visit: Payer: Self-pay

## 2020-05-22 DIAGNOSIS — K5090 Crohn's disease, unspecified, without complications: Secondary | ICD-10-CM | POA: Diagnosis present

## 2020-05-22 MED ORDER — ACETAMINOPHEN 325 MG PO TABS
650.0000 mg | ORAL_TABLET | Freq: Once | ORAL | Status: AC
  Administered 2020-05-22: 650 mg via ORAL

## 2020-05-22 MED ORDER — SODIUM CHLORIDE 0.9 % IV SOLN: INTRAVENOUS | Status: DC

## 2020-05-22 MED ORDER — ACETAMINOPHEN 325 MG PO TABS
ORAL_TABLET | ORAL | Status: AC
  Filled 2020-05-22: qty 2

## 2020-05-22 MED ORDER — SODIUM CHLORIDE 0.9 % IV SOLN
300.0000 mg | Freq: Once | INTRAVENOUS | Status: AC
  Administered 2020-05-22: 300 mg via INTRAVENOUS
  Filled 2020-05-22: qty 30

## 2020-05-22 MED ORDER — LORATADINE 10 MG PO TABS
10.0000 mg | ORAL_TABLET | Freq: Once | ORAL | Status: AC
  Administered 2020-05-22: 10 mg via ORAL

## 2020-05-22 MED ORDER — LORATADINE 10 MG PO TABS
ORAL_TABLET | ORAL | Status: AC
  Filled 2020-05-22: qty 1

## 2020-05-22 MED ORDER — SODIUM CHLORIDE 0.9 % IV SOLN: Freq: Once | INTRAVENOUS | Status: AC

## 2020-05-24 MED FILL — Sodium Chloride IV Soln 0.9%: INTRAVENOUS | Qty: 250 | Status: AC

## 2020-06-22 DIAGNOSIS — Z789 Other specified health status: Secondary | ICD-10-CM | POA: Diagnosis not present

## 2020-06-22 DIAGNOSIS — Z299 Encounter for prophylactic measures, unspecified: Secondary | ICD-10-CM | POA: Diagnosis not present

## 2020-06-22 DIAGNOSIS — K5090 Crohn's disease, unspecified, without complications: Secondary | ICD-10-CM | POA: Diagnosis not present

## 2020-06-22 DIAGNOSIS — G43009 Migraine without aura, not intractable, without status migrainosus: Secondary | ICD-10-CM | POA: Diagnosis not present

## 2020-06-22 DIAGNOSIS — T63481A Toxic effect of venom of other arthropod, accidental (unintentional), initial encounter: Secondary | ICD-10-CM | POA: Diagnosis not present

## 2020-07-01 ENCOUNTER — Telehealth: Payer: Self-pay | Admitting: *Deleted

## 2020-07-01 NOTE — Telephone Encounter (Signed)
Pt following up on Remicade treatments.  Says she gets every 6 weeks.  Next one due Friday.  [phone]

## 2020-07-03 ENCOUNTER — Encounter (HOSPITAL_COMMUNITY): Payer: Self-pay

## 2020-07-03 ENCOUNTER — Encounter (HOSPITAL_COMMUNITY)
Admission: RE | Admit: 2020-07-03 | Discharge: 2020-07-03 | Disposition: A | Discharge status: Home / Self Care | Payer: Medicare Other | Source: Ambulatory Visit | Attending: Gastroenterology | Admitting: Gastroenterology

## 2020-07-03 ENCOUNTER — Other Ambulatory Visit: Payer: Self-pay

## 2020-07-03 DIAGNOSIS — K5090 Crohn's disease, unspecified, without complications: Secondary | ICD-10-CM | POA: Insufficient documentation

## 2020-07-03 MED ORDER — SODIUM CHLORIDE 0.9 % IV SOLN: Freq: Once | INTRAVENOUS | Status: AC

## 2020-07-03 MED ORDER — LORATADINE 10 MG PO TABS
10.0000 mg | ORAL_TABLET | Freq: Once | ORAL | Status: AC
  Administered 2020-07-03: 10 mg via ORAL

## 2020-07-03 MED ORDER — ACETAMINOPHEN 325 MG PO TABS
650.0000 mg | ORAL_TABLET | Freq: Once | ORAL | Status: AC
  Administered 2020-07-03: 650 mg via ORAL

## 2020-07-03 MED ORDER — SODIUM CHLORIDE 0.9 % IV SOLN
300.0000 mg | Freq: Once | INTRAVENOUS | Status: AC
  Administered 2020-07-03: 300 mg via INTRAVENOUS
  Filled 2020-07-03: qty 30

## 2020-07-03 NOTE — Telephone Encounter (Signed)
Called short stay. Pt has arrived for her treatment today 07/03/20. Order was signed by AB.

## 2020-07-12 ENCOUNTER — Other Ambulatory Visit: Payer: Self-pay | Admitting: Nurse Practitioner

## 2020-07-13 NOTE — Progress Notes (Signed)
Referring Provider: Monico Blitz, MD Primary Care Physician:  Monico Blitz, MD Primary GI Physician: Dr. Abbey Chatters  Chief Complaint  Patient presents with  . Crohn's Disease    f/u. Stools have mucus like  . Abdominal Pain    all over x 2 months, rates 10/10, constant  . Nausea    w/ vomiting    HPI:   Kendra Franklin is a 29 y.o. female presenting today for follow-up with history of small bowel Crohn's disease, diagnosed in 2011 at age 3, perianal fistula, and osteoporosis in setting of Crohn's and prior steroid therapy, most recent DEXA scan March 2020 consistent with osteoporosis.  Also with history of chronic periumbilical abdominal pain s/p extensive evaluation with EGD/TCS January 2019 with mild gastritis, benign duodenal biopsies, normal colon, inflammation in the rectum s/p benign biopsy, GES 2019 within normal limits, Givens capsule February 2019 with mild gastritis, normal small bowel, CTE May 2020 with no active Crohn's disease.  She was referred to [facility] for further evaluation.  Last seen in our office April 2020.  Telemedicine visit with [facility] 11/12/2019.  She reported improvement in abdominal pain, nausea, vomiting, and appetite after stopping Imuran but still continued with some symptoms.  Felt her symptoms of constant periumbilical abdominal pain with intermittent pain exacerbations, tenderness to even light touch, nausea and vomiting, and abdominal bloating for most consistent with IBS and visceral hypersensitivity.  Recommended she continue infliximab every 6 weeks, consider IBgard, consider cyproheptadine or antispasmodic for abdominal pain, consider mirtazapine for nausea and appetite stimulant.   In December 2020, it was recommended for patient to have Remicade trough level 24 hours prior to next dose, okay to continue Remicade every 6 weeks, okay to hold Imuran.  Also need to have vitamin B12 checked.  Also advised to start vitamin D 50,000 units weekly x8 weeks,  then resume vitamin D 5000 units daily forever.  Start calcium 500 mg 3 times daily forever.  [facility] labs completed 01/08/2020 with infliximab trough levels within normal limits.  No antibodies. Vitamin B12 in February 2021 low at 166.  Advised to take vitamin B12 2000 mcg daily x7 days in the 1000 mcg daily forever.  Repeat B12 level was not completed.  Today: Mother present on telephone.   Wants to try to get off of Remicade. Imuran was making her feel bad so she stopped it. Feels Remicade is doing to same thing. For the last 2 months, she has been having the same symptoms she was experiencing with Imuran. Abdominal pain and loss of appetite. Forces herself to eat. Also with nausea with vomiting. Vomiting once every 2 weeks. No hematemesis. Always tired. Increased frequency of headaches.   Abdominal pain is across lower abdomen primarily. Constant. Feels like a rubber band is stretching. Not affected by meals. Occasionally increases prior to BMs and improves thereafter but returns later.   Increased mucous in her stool. Stools are solid. 2 BMs daily typically. Over the last week, she has had 3-4 BMs/day. No blood in the stool or black stool.   No GERD symptoms. No dysphagia. No early satiety. Loss of appetite has been present since 2019. Weight is stable since June 2020.   No recent travel or sick contacts.    PCP checked Vitamin D and is sending in new Rx.   No NSAIDs.   No fever, cold or flulike symptoms, lightheadedness, dizziness, presyncope, syncope, chest pain, heart palpitations, shortness of breath, cough.  Patient asked for something  to help with appetite as she was being discharged by the nurse.   Past Medical History:  Diagnosis Date  . Allergy    medicine  . Bloody diarrhea 03/11/2015  . Crohn's    DX   DEC 2011--- ASCA 100 pANCA NEG-APR 2012 6-TGN 233(LLN 230)  6-MMPN 1173 ON 75 MG IMURAN  . Crohn's disease, small and large intestine ([place])   . History of anal  fissures   . History of anal lesion    2009   NON--HEALING  . History of kidney stones   . History of small intestine ulcer    2011  . Osteoporosis     Past Surgical History:  Procedure Laterality Date  . COLONOSCOPY  DEC 2011   ILEO-COLONIC ULCERS  . COLONOSCOPY N/A 03/16/2015   SLF: 1/ The examined terminal ileum appeared to be normal 2. The left colon is redundatnt 3. Rectal bleeding due to small internal hemorrhoids  . COLONOSCOPY N/A 12/26/2017   Procedure: COLONOSCOPY;  Surgeon: Danie Binder, MD;  Examined ileum was normal, significant looping of the colon, diffuse mild inflammation in the rectum consistent with Crohn's disease s/p biopsy and random colon biopsies.  Pathology was benign.  . CYSTOSCOPY WITH RETROGRADE PYELOGRAM, URETEROSCOPY AND STENT PLACEMENT Left 02/27/2014   Procedure: CYSTOSCOPY WITH RETROGRADE PYELOGRAM, URETEROSCOPY/ STENT PLACEMENT;  Surgeon: Alexis Frock, MD;  Location: [facility];  Service: Urology;  Laterality: Left;  . ESOPHAGOGASTRODUODENOSCOPY N/A 03/16/2015   SLF: 1. epigastric pain most likely due to gastritis/pyschosocial stressors, less likely GERD. 2. Mild non-erosive gastritis.   Marland Kitchen ESOPHAGOGASTRODUODENOSCOPY N/A 12/26/2017   Procedure: ESOPHAGOGASTRODUODENOSCOPY (EGD);  Surgeon: Danie Binder, MD;  Normal examined esophagus, mild gastritis s/p biopsy, normal examined duodenum.  Gastric biopsy with mild chronic gastritis, duodenal biopsy benign.  Marland Kitchen FLEXIBLE SIGMOIDOSCOPY N/A 12/11/2013   Procedure: FLEXIBLE SIGMOIDOSCOPY;  Surgeon: Danie Binder, MD;  [facility];  Service: Endoscopy;  Laterality: N/A;  1:45 PM  . GIVENS CAPSULE STUDY  DEC 2011   SB ULCERS  . GIVENS CAPSULE STUDY N/A 01/08/2018   Procedure: GIVENS CAPSULE STUDY;  Surgeon: Danie Binder, MD;  Occasional erosion in the gastric mucosa, small bowel with no ulcers, masses, or AVMs.  Marland Kitchen HOLMIUM LASER APPLICATION Left 06/29/7077   Procedure: HOLMIUM LASER  APPLICATION;  Surgeon: Alexis Frock, MD;  Location: [facility];  Service: Urology;  Laterality: Left;  . INCISION AND DRAINAGE PERIRECTAL ABSCESS  2009  &  2010    Current Outpatient Medications  Medication Sig Dispense Refill  . alendronate (FOSAMAX) 70 MG tablet TAKE 1 TABLET (70 MG TOTAL) BY MOUTH EVERY 7 (SEVEN) DAYS. TAKE WITH A FULL GLASS OF WATER ON AN EMPTY STOMACH. (Patient taking differently: Take 70 mg by mouth every 7 (seven) days. Take with a full glass of water on an empty stomach.) 12 tablet 0  . inFLIXimab (REMICADE) 100 MG injection Inject 300 mg into the vein every 6 (six) weeks. LAST INJECTION NOV 2017    . mirtazapine (REMERON) 7.5 MG tablet Take 1 tablet (7.5 mg total) by mouth at bedtime. 30 tablet 3  . ondansetron (ZOFRAN) 4 MG tablet Take 1 tablet (4 mg total) by mouth every 8 (eight) hours as needed for nausea or vomiting. 30 tablet 1   No current facility-administered medications for this visit.    Allergies as of 07/15/2020 - Review Complete 07/15/2020  Allergen Reaction Noted  . Morphine Hives   . Other Rash  06/20/2018    Family History  Problem Relation Age of Onset  . Diabetes Maternal Grandmother   . Colon cancer Neg Hx   . Colon polyps Neg Hx   . Inflammatory bowel disease Neg Hx     Social History   Socioeconomic History  . Marital status: Single    Spouse name: Not on file  . Number of children: 0  . Years of education: BSW  . Highest education level: Bachelor's degree (e.g., BA, AB, BS)  Occupational History  . Occupation: Ship broker  Tobacco Use  . Smoking status: Never Smoker  . Smokeless tobacco: Never Used  Vaping Use  . Vaping Use: Never used  Substance and Sexual Activity  . Alcohol use: Yes    Comment: RARE  . Drug use: No  . Sexual activity: Not Currently  Other Topics Concern  . Not on file  Social History Narrative   Graduated from [employer]. Straight A student.    Now at [employer] in Masters of Gerontology program  (as of Oct 4536)     Mother: Arlyss Repress. No siblings. Mother smokes.   Lives at home w/ her mother   Right-handed   Caffeine: denies   Social Determinants of Health   Financial Resource Strain:   . Difficulty of Paying Living Expenses:   Food Insecurity:   . Worried About Charity fundraiser in the Last Year:   . Arboriculturist in the Last Year:   Transportation Needs:   . Film/video editor (Medical):   Marland Kitchen Lack of Transportation (Non-Medical):   Physical Activity:   . Days of Exercise per Week:   . Minutes of Exercise per Session:   Stress:   . Feeling of Stress :   Social Connections:   . Frequency of Communication with Friends and Family:   . Frequency of Social Gatherings with Friends and Family:   . Attends Religious Services:   . Active Member of Clubs or Organizations:   . Attends Archivist Meetings:   Marland Kitchen Marital Status:     Review of Systems: Gen: See HPI CV: See HPI Resp: See HPI GI: See HPI Derm: Denies rash Psych: Denies depression or anxiety Heme: See HPI  Physical Exam: BP 113/73   Pulse 89   Temp (!) 97.5 F (36.4 C)   Ht 5' 4"  (1.626 m)   Wt 116 lb 9.6 oz (52.9 kg)   BMI 20.01 kg/m  General:   Alert and oriented. No distress noted. Pleasant and cooperative.  Head:  Normocephalic and atraumatic. Eyes:  Conjuctiva clear without scleral icterus. Heart:  S1, S2 present without murmurs appreciated. Lungs:  Clear to auscultation bilaterally. No wheezes, rales, or rhonchi. No distress.  Abdomen:  +BS, soft, and non-distended. Mild generalized TTP, greatest in the LUQ and LLQ. No rebound or guarding. No HSM or masses noted. Msk:  Symmetrical without gross deformities. Normal posture. Extremities:  Without edema. Neurologic:  Alert and  oriented x4 Psych:  Normal mood and affect.  Assessment:  29 year old female with history of small bowel Crohn's disease diagnosed in 2011 on Remicade. Also a history of chronic abdominal pain with  extensive workup (see HPI) which had improved significantly after she discontinued Imuran around December 2020. Now reporting 2 months of generalized abdominal pain, primarily in the lower abdomen, increased stool frequency will 3-4 solid BMs daily with increased mucus, nausea with intermittent vomiting, lack of appetite (present since 2019), and fatigue. Pain is unaffected by meals.  Occasionally worse before BMs and improves somewhat thereafter. Previously saw [facility] in December 2020 for similar symptoms with consideration of IBS and visceral hypersensitivity and recommended considering antispasmodic, IBgard, and mirtazapine. She is currently only taking Remicade. Denies fever, brbpr, or melena. Patient expresses a desire to get off of Remicade she feels this is contributing to all of her symptoms. TTP along the left side of her abdomen primarily, but she does have mild generalized TTP.   Query whether patient may be having the start of a Crohn's flare as she has been off of Imuran for several months now. Could also have IBS. Lack of appetite is chronic and unchanged with prior evaluation including EGD and GES in 2019 that were unrevealing. Nausea may be secondary to history of Crohn's with possible flare or underlying gastritis noted on prior EGD.   Fatigue: History of B 12 deficiency, not currently taking supplement; suspect this may be contributing. Will update B12.   Plan:  Continue Remicade q6 weeks.  Update labs including CBC, CMP, Lipase, CRP, Sed rate, B12 If inflammatory markers are elevated will check [facility] labs.  If inflammatory markers are not elevated, could consider antispasmodic.  Zofran 4 mg q8 hours as needed for nausea.  Mirtazapine 7.5 mg nightly for appetite stimulant.  Continue avoiding all NSAIDs Consider trial of daily PPI if labs are unrevealing.  Advised patient to call with any worsening abdominal pain. Follow-up in 8 weeks with Dr. Abbey Chatters if possible. Or she will  keep appt with [name], NP that is already scheduled in October.    Aliene Altes, PA-C [facility]

## 2020-07-14 DIAGNOSIS — R202 Paresthesia of skin: Secondary | ICD-10-CM | POA: Diagnosis not present

## 2020-07-14 DIAGNOSIS — Z299 Encounter for prophylactic measures, unspecified: Secondary | ICD-10-CM | POA: Diagnosis not present

## 2020-07-14 DIAGNOSIS — E559 Vitamin D deficiency, unspecified: Secondary | ICD-10-CM | POA: Diagnosis not present

## 2020-07-14 DIAGNOSIS — K5090 Crohn's disease, unspecified, without complications: Secondary | ICD-10-CM | POA: Diagnosis not present

## 2020-07-14 DIAGNOSIS — L858 Other specified epidermal thickening: Secondary | ICD-10-CM | POA: Diagnosis not present

## 2020-07-15 ENCOUNTER — Telehealth: Payer: Self-pay | Admitting: "Endocrinology

## 2020-07-15 ENCOUNTER — Other Ambulatory Visit: Payer: Self-pay

## 2020-07-15 ENCOUNTER — Encounter: Payer: Self-pay | Admitting: Gastroenterology

## 2020-07-15 ENCOUNTER — Other Ambulatory Visit: Payer: Self-pay | Admitting: "Endocrinology

## 2020-07-15 ENCOUNTER — Ambulatory Visit (INDEPENDENT_AMBULATORY_CARE_PROVIDER_SITE_OTHER): Payer: Medicare Other | Admitting: Gastroenterology

## 2020-07-15 VITALS — BP 113/73 | HR 89 | Temp 97.5°F | Ht 64.0 in | Wt 116.6 lb

## 2020-07-15 DIAGNOSIS — R1084 Generalized abdominal pain: Secondary | ICD-10-CM | POA: Diagnosis not present

## 2020-07-15 DIAGNOSIS — M818 Other osteoporosis without current pathological fracture: Secondary | ICD-10-CM

## 2020-07-15 DIAGNOSIS — R630 Anorexia: Secondary | ICD-10-CM | POA: Diagnosis not present

## 2020-07-15 DIAGNOSIS — K5090 Crohn's disease, unspecified, without complications: Secondary | ICD-10-CM | POA: Diagnosis not present

## 2020-07-15 DIAGNOSIS — R112 Nausea with vomiting, unspecified: Secondary | ICD-10-CM | POA: Diagnosis not present

## 2020-07-15 LAB — COMPLETE METABOLIC PANEL WITH GFR
AG Ratio: 1.1 (calc) (ref 1.0–2.5)
ALT: 10 U/L (ref 6–29)
AST: 14 U/L (ref 10–30)
Albumin: 4.1 g/dL (ref 3.6–5.1)
Alkaline phosphatase (APISO): 52 U/L (ref 31–125)
BUN: 15 mg/dL (ref 7–25)
CO2: 26 mmol/L (ref 20–32)
Calcium: 9.3 mg/dL (ref 8.6–10.2)
Chloride: 104 mmol/L (ref 98–110)
Creat: 0.75 mg/dL (ref 0.50–1.10)
GFR, Est African American: 127 mL/min/{1.73_m2} (ref >=60)
GFR, Est Non African American: 109 mL/min/{1.73_m2} (ref >=60)
Globulin: 3.8 g/dL (calc) — ABNORMAL HIGH (ref 1.9–3.7)
Glucose, Bld: 80 mg/dL (ref 65–139)
Potassium: 4.1 mmol/L (ref 3.5–5.3)
Sodium: 138 mmol/L (ref 135–146)
Total Bilirubin: 0.6 mg/dL (ref 0.2–1.2)
Total Protein: 7.9 g/dL (ref 6.1–8.1)

## 2020-07-15 LAB — CBC WITH DIFFERENTIAL/PLATELET
Absolute Monocytes: 211 cells/uL (ref 200–950)
Basophils Absolute: 20 cells/uL (ref 0–200)
Basophils Relative: 0.5 %
Eosinophils Absolute: 70 cells/uL (ref 15–500)
Eosinophils Relative: 1.8 %
HCT: 33.9 % — ABNORMAL LOW (ref 35.0–45.0)
Hemoglobin: 11.1 g/dL — ABNORMAL LOW (ref 11.7–15.5)
Lymphs Abs: 1400 cells/uL (ref 850–3900)
MCH: 31.1 pg (ref 27.0–33.0)
MCHC: 32.7 g/dL (ref 32.0–36.0)
MCV: 95 fL (ref 80.0–100.0)
MPV: 12.8 fL — ABNORMAL HIGH (ref 7.5–12.5)
Monocytes Relative: 5.4 %
Neutro Abs: 2200 cells/uL (ref 1500–7800)
Neutrophils Relative %: 56.4 %
Platelets: 140 10*3/uL (ref 140–400)
RBC: 3.57 10*6/uL — ABNORMAL LOW (ref 3.80–5.10)
RDW: 11.8 % (ref 11.0–15.0)
Total Lymphocyte: 35.9 %
WBC: 3.9 10*3/uL (ref 3.8–10.8)

## 2020-07-15 LAB — C-REACTIVE PROTEIN: CRP: 0.2 mg/L (ref <8.0)

## 2020-07-15 LAB — VITAMIN B12: Vitamin B-12: 225 pg/mL (ref 200–1100)

## 2020-07-15 LAB — SEDIMENTATION RATE: Sed Rate: 14 mm/h (ref 0–20)

## 2020-07-15 LAB — LIPASE: Lipase: 29 U/L (ref 7–60)

## 2020-07-15 MED ORDER — MIRTAZAPINE 7.5 MG PO TABS: 7.5000 mg | ORAL_TABLET | Freq: Every day | ORAL | 3 refills | Status: DC

## 2020-07-15 MED ORDER — ONDANSETRON HCL 4 MG PO TABS: 4.0000 mg | ORAL_TABLET | Freq: Three times a day (TID) | ORAL | 1 refills | Status: DC | PRN

## 2020-07-15 NOTE — Assessment & Plan Note (Deleted)
29 year old female with history of small bowel Crohn's disease diagnosed in 2011 on Remicade. Also a history of chronic abdominal pain which had improved significantly after she discontinued Imuran in December 2020. Now reporting 2 months of generalized abdominal pain but primarily in the lower abdomen, increased stool frequency will 3-4 solid BMs daily with increased mucus, nausea with intermittent vomiting, and lack of appetite (present since 2019). Pain is unaffected by meals. Occasionally worse before BMs and improves somewhat thereafter. Previously saw [facility] in December 2020 for similar symptoms with consideration of IBS and visceral hypersensitivity and recommended considering antispasmodic, IBgard, and mirtazapine. She is currently only taking Remicade. Denies fever, brbpr, or melena. Patient expresses a desire to get off of Remicade she feels this is contributing to all of her symptoms. TTP along the left side of her abdomen primarily, but she does have mild generalized TTP.   Query whether patient may be having the start of a Crohn's flare as she has been off of Imuran for several months now. Could also have IBS. Lack of appetite is chronic and unchanged with prior evaluation including EGD and GES in 2019 that were unrevealing. Nausea may be secondary to history of Crohn's with possible flare or underlying gastritis noted on EGD.   Plan:  Update labs including CBC, CMP, Lipase, CRP, Sed rate If inflammatory markers are elevated will check [facility] labs.  If inflammatory markers are not elevated, could consider antispasmodic.  Zofran 4 mg q8 hours as needed for nausea.  Mirtazapine 7.5 mg nightly for appetite stimulant.  Consider trial of daily PPI if labs are unrevealing.  Follow-up in 8 weeks with Dr. Abbey Chatters if possible. Or she will keep appt with [name], NP that is already scheduled in October.

## 2020-07-15 NOTE — Telephone Encounter (Signed)
Pt has not been seen since October 2020 and is requesting a follow up appt. Does patient need to get labs done before her appt, if so can you please put in lab orders. Thanks

## 2020-07-15 NOTE — Telephone Encounter (Signed)
Done

## 2020-07-15 NOTE — Patient Instructions (Addendum)
Please have labs completed.   I have sent Zofran 4 mg to your pharmacy. You may take this every 8 hours as needed for nausea or vomiting.   Continue to avoid all NSAID products including ibuprofen, Aleve, Advil, Goody powders, naproxen, and anything that says "NSAID" on the package.  Continue to monitor your abdominal pain and let me know if any worsening symptoms.  We will call you with lab results.  We we will get you scheduled for follow-up in 8 weeks with Dr. Abbey Chatters.   Aliene Altes, PA-C [facility]

## 2020-07-16 ENCOUNTER — Telehealth: Payer: Self-pay | Admitting: Internal Medicine

## 2020-07-16 ENCOUNTER — Telehealth: Payer: Self-pay | Admitting: *Deleted

## 2020-07-16 NOTE — Telephone Encounter (Signed)
Routing to [employer]

## 2020-07-16 NOTE — Telephone Encounter (Signed)
Pt called in and said that PCP prescribed her Mirtazapine about a month ago and she only took 1 pill.  She said that she didn't want to take anymore after that because it made her sleep a day and half.  She said she wants to be switched to something else.  She went to her PCP on Tuesday and her results came back today that she has celiac disease.  She said that she is able to keep water and Ensure down.  She said she has to stop the Ensure now because of the celiac disease.  She is urinating well but is having nausea, poor appetite, and dry heaving.  She also says that her PCP is starting her on Vitamin B12 3000 mg one a day starting today.

## 2020-07-16 NOTE — Telephone Encounter (Signed)
Celiac disease is likely accounting for the majority of her symptoms: nausea, vomiting, abdominal pain.   I would like to review the labs from PCP office as well. I will review her labs she had completed with [place] very soon.   She will need to follow a strict gluten free diet.  We will hold off on any additional medications to help her appetite.  I sent in Zofran yesterday to help with nausea. She can take this every 8 hours as needed.    [name]: Can you request recent labs from PCPs office?

## 2020-07-16 NOTE — Telephone Encounter (Signed)
Lmom for pt to call me back. 

## 2020-07-16 NOTE — Progress Notes (Signed)
Hemoglobin is low at 11.1. Kidney function, electrolytes, and liver function tests within normal limits. CRP and SED Rate (inflammatory markers) within normal limits. Lipase within normal limits. B12 on the low end of normal.   Per telephone note 8/19, patient was diagnosed with celiac disease per her PCP. This could be the cause of her low hemoglobin, nausea, decreased appetite, abdominal pain, and increased stool frequency. With inflammatory markers within normal limits, it is unlikely that she is having a Crohn's flare.   Recommendations: 1. I need to review labs completed with PCP. I have asked [name] to request records.  2. We need to check an iron panel with ferritin. Angie, please arrange.  3. Agree with starting B12 per PCP (see telephone note 8/19) 4. Follow a gluten free diet.  5. After I review lab results from PCP and receive iron panel results, I will have further recommendations. She may end up needing endoscopic procedures.  6. She should continue to monitor her abdominal pain. If she has any worsening, she can let us know and I will order a CT for her.

## 2020-07-16 NOTE — Telephone Encounter (Signed)
PATIENT CALLED AND SAID THAT THE MEDICATION  KRISTEN HARPER CALLED IN FOR HER APPETITE CAUSES HER TO SLEEP, SHE HAS TAKEN IT PRIOR AND IT ONLY DID THAT.  SHE NEEDS TO TRY SOMETHING ELSE.

## 2020-07-16 NOTE — Telephone Encounter (Signed)
Pt called back and sent a note to Aliene Altes, [place].  See other note.

## 2020-07-17 NOTE — Telephone Encounter (Signed)
Requested labs

## 2020-07-17 NOTE — Telephone Encounter (Signed)
Spoke to pt and informed pt of Aliene Altes, PA's results and recommendations.  See result note.

## 2020-07-20 ENCOUNTER — Telehealth: Payer: Self-pay | Admitting: Gastroenterology

## 2020-07-20 NOTE — Telephone Encounter (Signed)
Received and reviewed labs completed with PCP.  Collection date 07/14/2020.  IgA 562 (H) TTG IgA <2 (negative) Deamidated gliadin antibodies, IgA 4 (negative)  CBC: WBC 4.6, hemoglobin 13.9, hematocrit 41.1, MCV 95, MCH 32.1, MCHC 33.8, platelets 183 CMP: Glucose 77, creatinine 0.73, sodium 141, potassium 4.2, chloride 104, calcium 9.7, total protein 8.1, albumin 4.6, total bilirubin 0.6, alk phos 59, AST 15, ALT 9 Vitamin D 30.6 Vitamin B12 199 (L)  Angie, please let patient know I have received and reviewed her labs. 1. She does not have celiac disease and does not need to follow a gluten-free diet.  2.  Her hemoglobin was 13.9 on 8/17.  On labs we completed 8/18, hemoglobin was 11.1. This is a fairly large variation from day-to-day especially without any obvious GI bleeding. We will need to repeat her hemoglobin to verify whether she is anemic or not. Please arrange H/H.  We will hold off on iron panel for now until anemia is confirmed.  3. To help with her abdominal pain, I would like to try her on Bentyl 10 mg. This medication can be dosed up to 3 times daily and at bedtime, but I recommend she start with once daily.  Medication can cause constipation and she should hold the medication if she develops constipation. I will send in Rx if she is agreeable.

## 2020-07-20 NOTE — Telephone Encounter (Signed)
Called and informed patient I mailed lab orders and to give the office a call back to sch follow up once she gets labs done.

## 2020-07-21 ENCOUNTER — Other Ambulatory Visit: Payer: Self-pay | Admitting: Gastroenterology

## 2020-07-21 ENCOUNTER — Other Ambulatory Visit: Payer: Self-pay | Admitting: *Deleted

## 2020-07-21 DIAGNOSIS — Z789 Other specified health status: Secondary | ICD-10-CM | POA: Diagnosis not present

## 2020-07-21 DIAGNOSIS — R1084 Generalized abdominal pain: Secondary | ICD-10-CM

## 2020-07-21 DIAGNOSIS — D649 Anemia, unspecified: Secondary | ICD-10-CM

## 2020-07-21 DIAGNOSIS — L858 Other specified epidermal thickening: Secondary | ICD-10-CM | POA: Diagnosis not present

## 2020-07-21 DIAGNOSIS — R112 Nausea with vomiting, unspecified: Secondary | ICD-10-CM

## 2020-07-21 DIAGNOSIS — K5090 Crohn's disease, unspecified, without complications: Secondary | ICD-10-CM | POA: Diagnosis not present

## 2020-07-21 DIAGNOSIS — Z299 Encounter for prophylactic measures, unspecified: Secondary | ICD-10-CM | POA: Diagnosis not present

## 2020-07-21 MED ORDER — DICYCLOMINE HCL 10 MG PO CAPS: ORAL_CAPSULE | ORAL | 2 refills | Status: DC

## 2020-07-21 NOTE — Telephone Encounter (Signed)
Called pt and made her aware that Bentyl had been sent to pharmacy.  She voiced understanding.  She said that she spoke with her PCP about the celiac disease and was informed that they thought that she had it because there were a few positive indicators but as it turns out, she said that we found out otherwise.  Pt says she is just frustrated because she continues to break out in a rash on her arms and legs.    Aliene Altes, PA:  Pt wants to know if you can send a RX for something to increase her appetite.  She said the last medication that we sent was not for her because it just made her sleepy.

## 2020-07-21 NOTE — Telephone Encounter (Signed)
Spoke to pt and advised her of Aliene Altes, PA's results and recommendations.  She said that this information didn't make sense to her since her PCP told her that she did have celiac disease.  Pt stated that she has an appointment today with her PCP and will discuss further.  She said that she will call us afterwards.  She was informed that a H/H is recommended.  She requested to have it drawn at [facility].  Pt agreed to have RX for Bentyl 10 mg sent to [facility] [place].  Informed her that I would let Aliene Altes, PA know.

## 2020-07-21 NOTE — Telephone Encounter (Signed)
Rx for Bentyl sent to pharmacy.

## 2020-07-22 NOTE — Telephone Encounter (Signed)
Called pt and informed her that we will hold off on any additional medications for appetite stimulation for now per Aliene Altes, PA.  Informed pt that we would like to see how she does with the current medications as Aliene Altes, PA has prescribed (Zofran and Bentyl) and follow-up on labs once they are completed. Informed pt that Zofran should help with nausea which should hopefully help her have the desire to eat.  Pt voiced understanding.

## 2020-07-22 NOTE — Telephone Encounter (Signed)
We will hold off on any additional medications for appetite stimulation for now. I would like to see how she does with the current medications I have prescribed (Zofran and Bentyl) and follow-up on labs once they are completed. Zofran should help with nausea which should hopefully help her have the desire to eat.

## 2020-07-24 ENCOUNTER — Other Ambulatory Visit: Payer: Self-pay | Admitting: "Endocrinology

## 2020-07-24 DIAGNOSIS — D649 Anemia, unspecified: Secondary | ICD-10-CM | POA: Diagnosis not present

## 2020-07-24 DIAGNOSIS — R1084 Generalized abdominal pain: Secondary | ICD-10-CM | POA: Diagnosis not present

## 2020-07-24 DIAGNOSIS — T380X5A Adverse effect of glucocorticoids and synthetic analogues, initial encounter: Secondary | ICD-10-CM | POA: Diagnosis not present

## 2020-07-24 DIAGNOSIS — M818 Other osteoporosis without current pathological fracture: Secondary | ICD-10-CM | POA: Diagnosis not present

## 2020-07-24 DIAGNOSIS — R112 Nausea with vomiting, unspecified: Secondary | ICD-10-CM | POA: Diagnosis not present

## 2020-07-24 LAB — HEMOGLOBIN AND HEMATOCRIT, BLOOD
HCT: 38 % (ref 35.0–45.0)
Hemoglobin: 12.6 g/dL (ref 11.7–15.5)

## 2020-07-25 LAB — COMPLETE METABOLIC PANEL WITH GFR
AG Ratio: 1.4 (calc) (ref 1.0–2.5)
ALT: 8 U/L (ref 6–29)
AST: 14 U/L (ref 10–30)
Albumin: 4.2 g/dL (ref 3.6–5.1)
Alkaline phosphatase (APISO): 45 U/L (ref 31–125)
BUN: 15 mg/dL (ref 7–25)
CO2: 27 mmol/L (ref 20–32)
Calcium: 9.5 mg/dL (ref 8.6–10.2)
Chloride: 106 mmol/L (ref 98–110)
Creat: 0.73 mg/dL (ref 0.50–1.10)
GFR, Est African American: 131 mL/min/{1.73_m2} (ref >=60)
GFR, Est Non African American: 113 mL/min/{1.73_m2} (ref >=60)
Globulin: 3.1 g/dL (calc) (ref 1.9–3.7)
Glucose, Bld: 78 mg/dL (ref 65–139)
Potassium: 4.1 mmol/L (ref 3.5–5.3)
Sodium: 139 mmol/L (ref 135–146)
Total Bilirubin: 0.6 mg/dL (ref 0.2–1.2)
Total Protein: 7.3 g/dL (ref 6.1–8.1)

## 2020-07-25 LAB — VITAMIN D 25 HYDROXY (VIT D DEFICIENCY, FRACTURES): Vit D, 25-Hydroxy: 27 ng/mL — ABNORMAL LOW (ref 30–100)

## 2020-08-03 NOTE — Progress Notes (Signed)
Hemoglobin is within normal limits at 12.6. Suspect a problem with the CBC drawn on 8/18 as her hemoglobin was within normal limits one day prior and 9 days after.   Please see how she is doing with Bentyl and Zofran.  Marland Kitchen

## 2020-08-05 DIAGNOSIS — B09 Unspecified viral infection characterized by skin and mucous membrane lesions: Secondary | ICD-10-CM | POA: Diagnosis not present

## 2020-08-05 DIAGNOSIS — L308 Other specified dermatitis: Secondary | ICD-10-CM | POA: Diagnosis not present

## 2020-08-06 ENCOUNTER — Telehealth: Payer: Self-pay | Admitting: "Endocrinology

## 2020-08-06 ENCOUNTER — Other Ambulatory Visit: Payer: Self-pay | Admitting: Gastroenterology

## 2020-08-06 DIAGNOSIS — R630 Anorexia: Secondary | ICD-10-CM

## 2020-08-06 NOTE — Telephone Encounter (Signed)
Yes

## 2020-08-06 NOTE — Telephone Encounter (Signed)
Tried reaching pt. Left vm to call back

## 2020-08-06 NOTE — Telephone Encounter (Signed)
Pt needs a appt with lab follow up but wants to have a virtual visit. Is it okay to make this pt a phone visit?

## 2020-08-11 DIAGNOSIS — K900 Celiac disease: Secondary | ICD-10-CM | POA: Diagnosis not present

## 2020-08-11 DIAGNOSIS — Z681 Body mass index (BMI) 19 or less, adult: Secondary | ICD-10-CM | POA: Diagnosis not present

## 2020-08-11 DIAGNOSIS — Z713 Dietary counseling and surveillance: Secondary | ICD-10-CM | POA: Diagnosis not present

## 2020-08-11 DIAGNOSIS — K5090 Crohn's disease, unspecified, without complications: Secondary | ICD-10-CM | POA: Diagnosis not present

## 2020-08-11 DIAGNOSIS — Z299 Encounter for prophylactic measures, unspecified: Secondary | ICD-10-CM | POA: Diagnosis not present

## 2020-08-12 ENCOUNTER — Other Ambulatory Visit: Payer: Self-pay

## 2020-08-12 ENCOUNTER — Telehealth (INDEPENDENT_AMBULATORY_CARE_PROVIDER_SITE_OTHER): Payer: Medicare Other | Admitting: "Endocrinology

## 2020-08-12 ENCOUNTER — Encounter: Payer: Self-pay | Admitting: "Endocrinology

## 2020-08-12 VITALS — Ht 64.0 in | Wt 110.0 lb

## 2020-08-12 DIAGNOSIS — T380X5A Adverse effect of glucocorticoids and synthetic analogues, initial encounter: Secondary | ICD-10-CM

## 2020-08-12 DIAGNOSIS — M818 Other osteoporosis without current pathological fracture: Secondary | ICD-10-CM

## 2020-08-12 DIAGNOSIS — E559 Vitamin D deficiency, unspecified: Secondary | ICD-10-CM | POA: Diagnosis not present

## 2020-08-12 MED ORDER — VITAMIN D3 125 MCG (5000 UT) PO CAPS: 5000.0000 [IU] | ORAL_CAPSULE | Freq: Every day | ORAL | 1 refills | Status: DC

## 2020-08-12 MED ORDER — ALENDRONATE SODIUM 35 MG PO TABS
35.0000 mg | ORAL_TABLET | ORAL | 3 refills | Status: DC
Start: 2020-08-12 — End: 2021-10-15

## 2020-08-12 NOTE — Progress Notes (Signed)
08/12/2020                                        Endocrinology Telehealth Visit Follow up Note -During COVID -19 Pandemic  This visit type was conducted  via telephone due to national recommendations for restrictions regarding the COVID-19 Pandemic  in an effort to limit this patient's exposure and mitigate transmission of the corona virus.   I connected with Kendra Franklin on 08/12/2020   by telephone and verified that I am speaking with the correct person using two identifiers. Kendra Franklin, 28-12-1990. she has verbally consented to this visit.  I was in my office and patient was in her residence. No other persons were with me during the encounter. All issues noted in this document were discussed and addressed. The format was not optimal for physical exam.   Subjective:    Patient ID: Kendra Franklin, female    DOB: 1991-02-01, PCP Monico Blitz, MD   Past Medical History:  Diagnosis Date   Allergy    medicine   Bloody diarrhea 03/11/2015   Crohn's    DX   DEC 2011--- ASCA 100 pANCA NEG-APR 2012 6-TGN 233(LLN 230)  6-MMPN 1173 ON 75 MG IMURAN   Crohn's disease, small and large intestine ([place])    History of anal fissures    History of anal lesion    2009   NON--HEALING   History of kidney stones    History of small intestine ulcer    2011   Osteoporosis    Past Surgical History:  Procedure Laterality Date   COLONOSCOPY  DEC 2011   ILEO-COLONIC ULCERS   COLONOSCOPY N/A 03/16/2015   SLF: 1/ The examined terminal ileum appeared to be normal 2. The left colon is redundatnt 3. Rectal bleeding due to small internal hemorrhoids   COLONOSCOPY N/A 12/26/2017   Procedure: COLONOSCOPY;  Surgeon: Danie Binder, MD;  Examined ileum was normal, significant looping of the colon, diffuse mild inflammation in the rectum consistent with Crohn's disease s/p biopsy and random colon biopsies.  Pathology was benign.   CYSTOSCOPY WITH RETROGRADE PYELOGRAM, URETEROSCOPY AND STENT  PLACEMENT Left 02/27/2014   Procedure: CYSTOSCOPY WITH RETROGRADE PYELOGRAM, URETEROSCOPY/ STENT PLACEMENT;  Surgeon: Alexis Frock, MD;  Location: [facility];  Service: Urology;  Laterality: Left;   ESOPHAGOGASTRODUODENOSCOPY N/A 03/16/2015   SLF: 1. epigastric pain most likely due to gastritis/pyschosocial stressors, less likely GERD. 2. Mild non-erosive gastritis.    ESOPHAGOGASTRODUODENOSCOPY N/A 12/26/2017   Procedure: ESOPHAGOGASTRODUODENOSCOPY (EGD);  Surgeon: Danie Binder, MD;  Normal examined esophagus, mild gastritis s/p biopsy, normal examined duodenum.  Gastric biopsy with mild chronic gastritis, duodenal biopsy benign.   FLEXIBLE SIGMOIDOSCOPY N/A 12/11/2013   Procedure: FLEXIBLE SIGMOIDOSCOPY;  Surgeon: Danie Binder, MD;  [facility];  Service: Endoscopy;  Laterality: N/A;  1:45 PM   GIVENS CAPSULE STUDY  DEC 2011   SB ULCERS   GIVENS CAPSULE STUDY N/A 01/08/2018   Procedure: GIVENS CAPSULE STUDY;  Surgeon: Danie Binder, MD;  Occasional erosion in the gastric mucosa, small bowel with no ulcers, masses, or AVMs.   HOLMIUM LASER APPLICATION Left 01/29/1961   Procedure: HOLMIUM LASER APPLICATION;  Surgeon: Alexis Frock, MD;  Location: [facility];  Service: Urology;  Laterality: Left;   INCISION AND DRAINAGE PERIRECTAL ABSCESS  2009  &  2010  Social History   Socioeconomic History   Marital status: Single    Spouse name: Not on file   Number of children: 0   Years of education: BSW   Highest education level: Bachelor's degree (e.g., BA, AB, BS)  Occupational History   Occupation: Student  Tobacco Use   Smoking status: Never Smoker   Smokeless tobacco: Never Used  Vaping Use   Vaping Use: Never used  Substance and Sexual Activity   Alcohol use: Yes    Comment: RARE   Drug use: No   Sexual activity: Not Currently  Other Topics Concern   Not on file  Social History Narrative   Graduated from [employer].  Straight A student.    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 No siblings. Mother smokes.   Lives at home w/ her mother   Right-handed   Caffeine: denies   Social Determinants of Health   Financial Resource Strain:    Difficulty of Paying Living Expenses: Not on file  Food Insecurity:    Worried About Charity fundraiser in the Last Year: Not on file   [employer] of Food in the Last Year: Not on file  Transportation Needs:    Lack of Transportation (Medical): Not on file   Lack of Transportation (Non-Medical): Not on file  Physical Activity:    Days of Exercise per Week: Not on file   Minutes of Exercise per Session: Not on file  Stress:    Feeling of Stress : Not on file  Social Connections:    Frequency of Communication with Friends and Family: Not on file   Frequency of Social Gatherings with Friends and Family: Not on file   Attends Religious Services: Not on file   Active Member of Clubs or Organizations: Not on file   Attends Archivist Meetings: Not on file   Marital Status: Not on file   Outpatient Encounter Medications as of 08/12/2020  Medication Sig   alendronate (FOSAMAX) 35 MG tablet Take 1 tablet (35 mg total) by mouth every 7 (seven) days. Take with a full glass of water on an empty stomach.   [DISCONTINUED] alendronate (FOSAMAX) 35 MG tablet Take 35 mg by mouth every 7 (seven) days. Take with a full glass of water on an empty stomach.   Cholecalciferol (VITAMIN D3) 125 MCG (5000 UT) CAPS Take 1 capsule (5,000 Units total) by mouth daily.   inFLIXimab (REMICADE) 100 MG injection Inject 300 mg into the vein every 6 (six) weeks. LAST INJECTION NOV 2017   [DISCONTINUED] alendronate (FOSAMAX) 70 MG tablet TAKE 1 TABLET (70 MG TOTAL) BY MOUTH EVERY 7 (SEVEN) DAYS. TAKE WITH A FULL GLASS OF WATER ON AN EMPTY STOMACH. (Patient taking differently: Take 70 mg by mouth every 7 (seven) days. Take  with a full glass of water on an empty stomach.)   [DISCONTINUED] dicyclomine (BENTYL) 10 MG capsule Take 1 capsule (10 mg) by mouth up to 3 times daily for abdominal pain.  Start with 1 capsule daily and increase as needed. Hold in the setting of constipation.   [DISCONTINUED] mirtazapine (REMERON) 7.5 MG tablet Take 1 tablet (7.5 mg total) by mouth at bedtime.   [DISCONTINUED] ondansetron (ZOFRAN) 4 MG tablet Take 1 tablet (4 mg total) by mouth every 8 (eight) hours as needed for nausea or vomiting.   No facility-administered encounter medications on file as of 08/12/2020.   ALLERGIES:  Allergies  Allergen Reactions   Morphine Hives   Other Rash    Coban causes bruising and rash    VACCINATION STATUS: There is no immunization history for the selected administration types on file for this patient.  HPI  29 year old female patient with medical history as above. She is being engaged in telehealth via telephone in follow-up for osteoporosis.   - She is known to have Crohn's disease currently on therapy azathioprine and Remicade. She follows regularly in gastroenterology with [name]. - She has required some steroid therapy in the past but appears to be very minimal exposure. - She underwent bone density study on 12/07/2016 which showed Z score of -2.9 on Dual Femur and -0.4 on spine. -She was initiated on Fosamax 70 mg p.o. weekly during her in April 2018.  And also continued during her last visit in October 2020.  She tolerates this medication, however admittedly she has not been consistent taking this medicine lately.    She is not due for another bone density until March 2022.  Her most recent bone density in March 2020 showed mild to moderate response to treatment with that T score on L1-L4 was -0.4 slightly improving from -0.5, on dual femur left was -2.8 slightly improving from -2.9.  The mean Daul Femur T score got worse from -2.2 to -2.3.    - She denies any interval falls nor  fractures.  She denies family history of Crohn's disease nor osteoporosis. - She does not involve in contact sports.  -She is taking multivitamins. She denies any history of thyroid/parathyroid dysfunction. - In the interval, she lost 5 more pounds.  She still has issues with her Crohn's disease flaring up on and off.    Review of Systems  Limited as above.  Objective:    Ht 5' 4"  (1.626 m)    Wt 110 lb (49.9 kg)    BMI 18.88 kg/m   Wt Readings from Last 3 Encounters:  08/12/20 110 lb (49.9 kg)  07/15/20 116 lb 9.6 oz (52.9 kg)  07/03/20 111 lb 1.8 oz (50.4 kg)    Physical Exam   CMP ( most recent) CMP     Component Value Date/Time   NA 139 07/24/2020 1025   K 4.1 07/24/2020 1025   CL 106 07/24/2020 1025   CO2 27 07/24/2020 1025   GLUCOSE 78 07/24/2020 1025   BUN 15 07/24/2020 1025   CREATININE 0.73 07/24/2020 1025   CALCIUM 9.5 07/24/2020 1025   PROT 7.3 07/24/2020 1025   ALBUMIN 4.3 06/01/2018 1245   AST 14 07/24/2020 1025   ALT 8 07/24/2020 1025   ALKPHOS 45 06/01/2018 1245   BILITOT 0.6 07/24/2020 1025   GFRNONAA 113 07/24/2020 1025   GFRAA 131 07/24/2020 1025    Recent Results (from the past 2160 hour(s))  CBC w/Diff/Platelet     Status: Abnormal   Collection Time: 07/15/20  9:30 AM  Result Value Ref Range   WBC 3.9 3.8 - 10.8 Thousand/uL   RBC 3.57 (L) 3.80 - 5.10 Million/uL   Hemoglobin 11.1 (L) 11.7 - 15.5 g/dL   HCT 33.9 (L) 35 - 45 %   MCV 95.0 80.0 - 100.0 fL   MCH 31.1 27.0 - 33.0 pg   MCHC 32.7 32.0 - 36.0 g/dL   RDW 11.8 11.0 - 15.0 %   Platelets 140 140 - 400 Thousand/uL   MPV 12.8 (H) 7.5 - 12.5 fL   Neutro Abs 2,200 1,500 - 7,800 cells/uL  Lymphs Abs 1,400 850 - 3,900 cells/uL   Absolute Monocytes 211 200 - 950 cells/uL   Eosinophils Absolute 70 15 - 500 cells/uL   Basophils Absolute 20 0 - 200 cells/uL   Neutrophils Relative % 56.4 %   Total Lymphocyte 35.9 %   Monocytes Relative 5.4 %   Eosinophils Relative 1.8 %   Basophils  Relative 0.5 %  COMPLETE METABOLIC PANEL WITH GFR     Status: Abnormal   Collection Time: 07/15/20  9:30 AM  Result Value Ref Range   Glucose, Bld 80 65 - 139 mg/dL    Comment: .        Non-fasting reference interval .    BUN 15 7 - 25 mg/dL   Creat 0.75 0.50 - 1.10 mg/dL   GFR, Est Non African American 109 > OR = 60 mL/min/1.41m   GFR, Est African American 127 > OR = 60 mL/min/1.7110m  BUN/Creatinine Ratio NOT APPLICABLE 6 - 22 (calc)   Sodium 138 135 - 146 mmol/L   Potassium 4.1 3.5 - 5.3 mmol/L   Chloride 104 98 - 110 mmol/L   CO2 26 20 - 32 mmol/L   Calcium 9.3 8.6 - 10.2 mg/dL   Total Protein 7.9 6.1 - 8.1 g/dL   Albumin 4.1 3.6 - 5.1 g/dL   Globulin 3.8 (H) 1.9 - 3.7 g/dL (calc)   AG Ratio 1.1 1.0 - 2.5 (calc)   Total Bilirubin 0.6 0.2 - 1.2 mg/dL   Alkaline phosphatase (APISO) 52 31 - 125 U/L   AST 14 10 - 30 U/L   ALT 10 6 - 29 U/L  B12     Status: None   Collection Time: 07/15/20  9:30 AM  Result Value Ref Range   Vitamin B-12 225 200 - 1,100 pg/mL    Comment: . Please Note: Although the reference range for vitamin B12 is [phone] pg/mL, it has been reported that between 5 and 10% of patients with values between 200 and 400 pg/mL may experience neuropsychiatric and hematologic abnormalities due to occult B12 deficiency; less than 1% of patients with values above 400 pg/mL will have symptoms. .   C-reactive protein     Status: None   Collection Time: 07/15/20  9:30 AM  Result Value Ref Range   CRP 0.2 <8.0 mg/L  Sed Rate (ESR)     Status: None   Collection Time: 07/15/20  9:30 AM  Result Value Ref Range   Sed Rate 14 0 - 20 mm/h  Lipase     Status: None   Collection Time: 07/15/20  9:30 AM  Result Value Ref Range   Lipase 29 7 - 60 U/L  Hemoglobin and hematocrit, blood     Status: None   Collection Time: 07/24/20 10:21 AM  Result Value Ref Range   Hemoglobin 12.6 11.7 - 15.5 g/dL   HCT 38.0 35 - 45 %  COMPLETE METABOLIC PANEL WITH GFR     Status: None    Collection Time: 07/24/20 10:25 AM  Result Value Ref Range   Glucose, Bld 78 65 - 139 mg/dL    Comment: .        Non-fasting reference interval .    BUN 15 7 - 25 mg/dL   Creat 0.73 0.50 - 1.10 mg/dL   GFR, Est Non African American 113 > OR = 60 mL/min/1.7338m GFR, Est African American 131 > OR = 60 mL/min/1.45m24mBUN/Creatinine Ratio NOT APPLICABLE 6 - 22 (calc)  Sodium 139 135 - 146 mmol/L   Potassium 4.1 3.5 - 5.3 mmol/L   Chloride 106 98 - 110 mmol/L   CO2 27 20 - 32 mmol/L   Calcium 9.5 8.6 - 10.2 mg/dL   Total Protein 7.3 6.1 - 8.1 g/dL   Albumin 4.2 3.6 - 5.1 g/dL   Globulin 3.1 1.9 - 3.7 g/dL (calc)   AG Ratio 1.4 1.0 - 2.5 (calc)   Total Bilirubin 0.6 0.2 - 1.2 mg/dL   Alkaline phosphatase (APISO) 45 31 - 125 U/L   AST 14 10 - 30 U/L   ALT 8 6 - 29 U/L  VITAMIN D 25 Hydroxy (Vit-D Deficiency, Fractures)     Status: Abnormal   Collection Time: 07/24/20 10:25 AM  Result Value Ref Range   Vit D, 25-Hydroxy 27 (L) 30 - 100 ng/mL    Comment: Vitamin D Status         25-OH Vitamin D: . Deficiency:                    <20 ng/mL Insufficiency:             20 - 29 ng/mL Optimal:                 > or = 30 ng/mL . For 25-OH Vitamin D testing on patients on  D2-supplementation and patients for whom quantitation  of D2 and D3 fractions is required, the [facility](TM) 25-OH VIT D, (D2,D3), LC/MS/MS is recommended: order  code [id number] (patients >107yr). See Note 1 . Note 1 . For additional information, please refer to  http://education.[facility].com/faq/FAQ199  (This link is being provided for informational/ educational purposes only.)     Bone densitometry from 12/07/2016 is reviewed.  Assessment & Plan:   1. Osteoporosis -Admittedly, she has not been consistent taking her Fosamax.  She will still benefit from this intervention.  She is open to reconsider.  I discussed and prescribed Fosamax at a lower dose of 35 mg weekly.  Side effects and precautions  were discussed again with her. - She has multiple risk factors for osteoporosis including Crohn's disease. - She does not give history significant for heavy steroid exposure. -  Her workup for common secondary cause of osteoporosis are negative including hyperparathyroidism/hyperthyroidism .  - As osteoporosis in premenopausal woman is not diagnosed based on bone densitometry alone, Given the significantly decreased bone mass on bilateral hips with Z score of -2.8 and active Crohn's disease , she remains at risk for fractures.    - It is not clear if she ever achieved optimal bone mass , it is possible that she never achieved a peak bone mass prior to her diagnosis with osteoporosis. -Considering all the circumstances, she stands to continue to benefit from bisphosphonate therapy with adequate vitamin D and calcium supplements.   Her previsit labs show vitamin D is still insufficient at 27.  I discussed and resumed her vitamin D supplements at vitamin D3 5000 units daily.    - She will have her repeat bone density in March 2022  to assess effect of therapy. - She is  advised to avoid strenuous exercise including contact sports however would benefit from weight bearing exercises including resistance cord, swimming, walking. - This measure is important to improve her posture and decrease her risk of falls/fractures.  - I advised patient to maintain close follow up with her PCP primary care needs. She is advised to maintain close follow-up with her PMD.     -  Time spent on this patient care encounter:  20 minutes of which 50% was spent in  counseling and the rest reviewing  her current and  previous labs / studies and medications  doses and developing a plan for long term care. Kendra Franklin  participated in the discussions, expressed understanding, and voiced agreement with the above plans.  All questions were answered to her satisfaction. she is encouraged to contact clinic should she have any  questions or concerns prior to her return visit.   Follow up plan: Return in about 7 months (around 02/26/2021) for F/U with Pre-visit Labs, DXA Scan B4 NV.  Glade Lloyd, MD Phone: [phone]  Fax: [phone]   08/12/2020, 12:54 PM

## 2020-08-14 ENCOUNTER — Other Ambulatory Visit: Payer: Self-pay

## 2020-08-14 ENCOUNTER — Encounter (HOSPITAL_COMMUNITY): Payer: Self-pay

## 2020-08-14 ENCOUNTER — Encounter (HOSPITAL_COMMUNITY)
Admission: RE | Admit: 2020-08-14 | Discharge: 2020-08-14 | Disposition: A | Discharge status: Home / Self Care | Payer: Medicare Other | Source: Ambulatory Visit | Attending: Gastroenterology | Admitting: Gastroenterology

## 2020-08-14 DIAGNOSIS — K5090 Crohn's disease, unspecified, without complications: Secondary | ICD-10-CM | POA: Insufficient documentation

## 2020-08-14 MED ORDER — SODIUM CHLORIDE 0.9 % IV SOLN
300.0000 mg | Freq: Once | INTRAVENOUS | Status: AC
  Administered 2020-08-14: 300 mg via INTRAVENOUS
  Filled 2020-08-14: qty 30

## 2020-08-14 MED ORDER — ACETAMINOPHEN 325 MG PO TABS
650.0000 mg | ORAL_TABLET | Freq: Once | ORAL | Status: AC
  Administered 2020-08-14: 650 mg via ORAL
  Filled 2020-08-14: qty 2

## 2020-08-14 MED ORDER — SODIUM CHLORIDE 0.9 % IV SOLN: Freq: Once | INTRAVENOUS | Status: AC

## 2020-08-14 MED ORDER — LORATADINE 10 MG PO TABS
10.0000 mg | ORAL_TABLET | Freq: Once | ORAL | Status: AC
  Administered 2020-08-14: 10 mg via ORAL
  Filled 2020-08-14: qty 1

## 2020-08-26 ENCOUNTER — Telehealth: Payer: Self-pay

## 2020-08-26 NOTE — Telephone Encounter (Signed)
Pt called and would like to change her medication used to help her appetite. Pt is currently taking Mirtazapine 7.5 mg. Pt says the  Mirtazapine 7/5 mg makes her sleep a lot and she wants a medication that will bring her appetite back during the day.

## 2020-08-27 ENCOUNTER — Telehealth: Payer: Self-pay | Admitting: Internal Medicine

## 2020-08-27 NOTE — Telephone Encounter (Signed)
Noted. Spoke with pt and she will f/u with Dr. Abbey Chatters next week concerning medicine options.

## 2020-08-27 NOTE — Telephone Encounter (Signed)
Spoke with pt. Message was sent to provider yesterday 08/26/20 with new medication request. Per pt, if new medication is called in, pt would like it sent into [facility] and no phone call back per pt.

## 2020-08-27 NOTE — Telephone Encounter (Signed)
I would like to hold off on prescribing any additional medications for appetite stimulation. She has an appointment with Dr. Abbey Chatters next week on 10/6. He may have some better options for her.

## 2020-08-27 NOTE — Telephone Encounter (Signed)
Patient called yesterday with an issue and did not receive a call back

## 2020-09-02 ENCOUNTER — Ambulatory Visit: Payer: Medicare Other | Admitting: Internal Medicine

## 2020-09-08 ENCOUNTER — Ambulatory Visit: Payer: Medicare Other | Admitting: Gastroenterology

## 2020-09-18 DIAGNOSIS — N644 Mastodynia: Secondary | ICD-10-CM | POA: Diagnosis not present

## 2020-09-18 DIAGNOSIS — N6311 Unspecified lump in the right breast, upper outer quadrant: Secondary | ICD-10-CM | POA: Diagnosis not present

## 2020-09-24 ENCOUNTER — Encounter: Payer: Self-pay | Admitting: Internal Medicine

## 2020-09-24 ENCOUNTER — Ambulatory Visit (INDEPENDENT_AMBULATORY_CARE_PROVIDER_SITE_OTHER): Payer: Medicare Other | Admitting: Internal Medicine

## 2020-09-24 ENCOUNTER — Other Ambulatory Visit: Payer: Self-pay

## 2020-09-24 VITALS — BP 113/71 | HR 91 | Temp 96.9°F | Ht 64.0 in | Wt 109.0 lb

## 2020-09-24 DIAGNOSIS — R1084 Generalized abdominal pain: Secondary | ICD-10-CM | POA: Diagnosis not present

## 2020-09-24 DIAGNOSIS — K5090 Crohn's disease, unspecified, without complications: Secondary | ICD-10-CM

## 2020-09-24 DIAGNOSIS — E559 Vitamin D deficiency, unspecified: Secondary | ICD-10-CM

## 2020-09-24 DIAGNOSIS — R634 Abnormal weight loss: Secondary | ICD-10-CM | POA: Diagnosis not present

## 2020-09-24 MED ORDER — AMITRIPTYLINE HCL 10 MG PO TABS: 10.0000 mg | ORAL_TABLET | Freq: Every day | ORAL | 5 refills | Status: DC

## 2020-09-24 NOTE — Progress Notes (Signed)
Referring Provider: Monico Blitz, MD Primary Care Physician:  Monico Blitz, MD Primary GI:  Dr. Abbey Chatters  Chief Complaint  Patient presents with  . Follow-up  . Abdominal Pain  . Crohn's Disease    Remicade     HPI:   Kendra Franklin is a 29 y.o. female who presents to the clinic today for follow-up visit.  She has a complicated GI history.  She has a history of small bowel Crohn's disease diagnosed in 2011 at age 3, perianal fistula, and osteoporosis.  Most recent DEXA scan March 2020 consistent with osteoporosis.  Last EGD and colonoscopy January 2019 with mild gastritis benign duodenal biopsies, normal colon, slight inflammation in the rectum with negative biopsies throughout.  She underwent capsule endoscopy February 2019 with mild gastritis normal small bowel.  CT enterography May 2020 with no active Crohn's disease  She has seen [facility] in the past, visit 11/12/2019, who felt her symptoms consistent with IBS and visceral hypersensitivity.  She was placed on mirtazapine which she states "knocked her out."  She is also tried Bentyl with no relief in her symptoms.  For her Crohn's disease she is currently maintained on Remicade.  Trough levels on 01/08/2020 within normal limits, no antibodies.  Also has history of hypovitaminosis B12 currently taking B12 supplementations.  She is also taking vitamin D and calcium daily.   Today, patient's main complaint continues to be her periumbilical abdominal pain.  States it occurs all the time.  It was exacerbated when she was on Imuran previously but improved when she stopped this medication.  However she continues to have issues.  Also has weight loss weighing 109 pounds today in clinic.  Previously 116 on last visit 3 months ago.  Endorses normal bowel movements.  No blood or mucus in her stools.  Past Medical History:  Diagnosis Date  . Allergy    medicine  . Bloody diarrhea 03/11/2015  . Crohn's    DX   DEC 2011--- ASCA 100 pANCA  NEG-APR 2012 6-TGN 233(LLN 230)  6-MMPN 1173 ON 75 MG IMURAN  . Crohn's disease, small and large intestine ([place])   . History of anal fissures   . History of anal lesion    2009   NON--HEALING  . History of kidney stones   . History of small intestine ulcer    2011  . Osteoporosis     Past Surgical History:  Procedure Laterality Date  . COLONOSCOPY  DEC 2011   ILEO-COLONIC ULCERS  . COLONOSCOPY N/A 03/16/2015   SLF: 1/ The examined terminal ileum appeared to be normal 2. The left colon is redundatnt 3. Rectal bleeding due to small internal hemorrhoids  . COLONOSCOPY N/A 12/26/2017   Procedure: COLONOSCOPY;  Surgeon: Danie Binder, MD;  Examined ileum was normal, significant looping of the colon, diffuse mild inflammation in the rectum consistent with Crohn's disease s/p biopsy and random colon biopsies.  Pathology was benign.  . CYSTOSCOPY WITH RETROGRADE PYELOGRAM, URETEROSCOPY AND STENT PLACEMENT Left 02/27/2014   Procedure: CYSTOSCOPY WITH RETROGRADE PYELOGRAM, URETEROSCOPY/ STENT PLACEMENT;  Surgeon: Alexis Frock, MD;  [facility]: [facility];  Service: Urology;  Laterality: Left;  . ESOPHAGOGASTRODUODENOSCOPY N/A 03/16/2015   SLF: 1. epigastric pain most likely due to gastritis/pyschosocial stressors, less likely GERD. 2. Mild non-erosive gastritis.   Marland Kitchen ESOPHAGOGASTRODUODENOSCOPY N/A 12/26/2017   Procedure: ESOPHAGOGASTRODUODENOSCOPY (EGD);  Surgeon: Danie Binder, MD;  Normal examined esophagus, mild gastritis s/p biopsy, normal examined duodenum.  Gastric  biopsy with mild chronic gastritis, duodenal biopsy benign.  Marland Kitchen FLEXIBLE SIGMOIDOSCOPY N/A 12/11/2013   Procedure: FLEXIBLE SIGMOIDOSCOPY;  Surgeon: Danie Binder, MD;  [facility];  Service: Endoscopy;  Laterality: N/A;  1:45 PM  . GIVENS CAPSULE STUDY  DEC 2011   SB ULCERS  . GIVENS CAPSULE STUDY N/A 01/08/2018   Procedure: GIVENS CAPSULE STUDY;  Surgeon: Danie Binder, MD;  Occasional erosion in the  gastric mucosa, small bowel with no ulcers, masses, or AVMs.  Marland Kitchen HOLMIUM LASER APPLICATION Left 01/30/4561   Procedure: HOLMIUM LASER APPLICATION;  Surgeon: Alexis Frock, MD;  Location: [facility];  Service: Urology;  Laterality: Left;  . INCISION AND DRAINAGE PERIRECTAL ABSCESS  2009  &  2010    Current Outpatient Medications  Medication Sig Dispense Refill  . alendronate (FOSAMAX) 35 MG tablet Take 1 tablet (35 mg total) by mouth every 7 (seven) days. Take with a full glass of water on an empty stomach. 12 tablet 3  . Cholecalciferol (VITAMIN D3) 125 MCG (5000 UT) CAPS Take 1 capsule (5,000 Units total) by mouth daily. 90 capsule 1  . inFLIXimab (REMICADE) 100 MG injection Inject 300 mg into the vein every 6 (six) weeks. LAST INJECTION NOV 2017    . amitriptyline (ELAVIL) 10 MG tablet Take 1 tablet (10 mg total) by mouth at bedtime. 30 tablet 5   No current facility-administered medications for this visit.    Allergies as of 09/24/2020 - Review Complete 09/24/2020  Allergen Reaction Noted  . Morphine Hives   . Other Rash 06/20/2018    Family History  Problem Relation Age of Onset  . Diabetes Maternal Grandmother   . Colon cancer Neg Hx   . Colon polyps Neg Hx   . Inflammatory bowel disease Neg Hx     Social History   Socioeconomic History  . Marital status: Single    Spouse name: Not on file  . Number of children: 0  . Years of education: BSW  . Highest education level: Bachelor's degree (e.g., BA, AB, BS)  Occupational History  . Occupation: Ship broker  Tobacco Use  . Smoking status: Never Smoker  . Smokeless tobacco: Never Used  Vaping Use  . Vaping Use: Never used  Substance and Sexual Activity  . Alcohol use: Yes    Comment: RARE  . Drug use: No  . Sexual activity: Not Currently  Other Topics Concern  . Not on file  Social History Narrative   Graduated from [employer]. Straight A student.    Now at [employer] in Masters of Gerontology program (as of Oct  5638)     Mother: Arlyss Repress. No siblings. Mother smokes.   Lives at home w/ her mother   Right-handed   Caffeine: denies   Social Determinants of Health   Financial Resource Strain:   . Difficulty of Paying Living Expenses: Not on file  Food Insecurity:   . Worried About Charity fundraiser in the Last Year: Not on file  . Ran Out of Food in the Last Year: Not on file  Transportation Needs:   . Lack of Transportation (Medical): Not on file  . Lack of Transportation (Non-Medical): Not on file  Physical Activity:   . Days of Exercise per Week: Not on file  . Minutes of Exercise per Session: Not on file  Stress:   . Feeling of Stress : Not on file  Social Connections:   . Frequency of Communication with Friends and  Family: Not on file  . Frequency of Social Gatherings with Friends and Family: Not on file  . Attends Religious Services: Not on file  . Active Member of Clubs or Organizations: Not on file  . Attends Archivist Meetings: Not on file  . Marital Status: Not on file    Subjective: Review of Systems  Constitutional: Negative for chills and fever.  HENT: Negative for congestion and hearing loss.   Eyes: Negative for blurred vision and double vision.  Respiratory: Negative for cough and shortness of breath.   Cardiovascular: Negative for chest pain and palpitations.  Gastrointestinal: Positive for abdominal pain. Negative for blood in stool, constipation, diarrhea, heartburn, melena and vomiting.  Genitourinary: Negative for dysuria and urgency.  Musculoskeletal: Negative for joint pain and myalgias.  Skin: Negative for itching and rash.  Neurological: Negative for dizziness and headaches.  Psychiatric/Behavioral: Negative for depression. The patient is not nervous/anxious.      Objective: BP 113/71   Pulse 91   Temp (!) 96.9 F (36.1 C) (Temporal)   Ht 5' 4"  (1.626 m)   Wt 109 lb (49.4 kg)   BMI 18.71 kg/m  Physical Exam Constitutional:       Appearance: Normal appearance.  HENT:     Head: Normocephalic and atraumatic.  Eyes:     Extraocular Movements: Extraocular movements intact.     Conjunctiva/sclera: Conjunctivae normal.  Cardiovascular:     Rate and Rhythm: Normal rate and regular rhythm.  Pulmonary:     Effort: Pulmonary effort is normal.     Breath sounds: Normal breath sounds.  Abdominal:     General: Bowel sounds are normal.     Palpations: Abdomen is soft.  Musculoskeletal:        General: No swelling. Normal range of motion.     Cervical back: Normal range of motion and neck supple.  Skin:    General: Skin is warm and dry.     Coloration: Skin is not jaundiced.  Neurological:     General: No focal deficit present.     Mental Status: She is alert and oriented to person, place, and time.  Psychiatric:        Mood and Affect: Mood normal.        Behavior: Behavior normal.      Assessment: *Crohn's disease-well controlled on Remicade *Abdominal pain-chronic, etiology unclear *Weight loss *Osteoporosis-due to Crohn's disease and previous steroid use *Vitamin B12 deficiency  Plan: Crohn's disease appears to be well controlled at this time.  Continue on Remicade every 6 weeks.  Most recent trough levels February 2021 was 17.  Given her history of fistulizing disease her goal should be greater than 10.   Etiology of patient's abdominal pain unclear.  She has had extensive work-up including EGD, colonoscopy, capsule endoscopy, numerous imaging studies including CT scans.  She has tried mirtazapine without improvement in her symptoms.  Etiologies include irritable bowel syndrome, visceral hypersensitivity, possible functional dyspepsia.  I will trial her on amitriptyline 10 mg nightly.  Discussed side effect profile and she is agreeable.  We will consider titrating up in 2 to 3 weeks.  Discussed that optimal response probably will happen for at least 4 to 6 weeks and we need to trial this medicine for a total  of at least 8 to 12 weeks.  She understands.  Continue on vitamin D, calcium, vitamin B12.  Osteoporosis being managed by her PCP.  Patient follow-up in 3 months or sooner if needed.  09/24/2020 9:40 AM   Disclaimer: This note was dictated with voice recognition software. Similar sounding words can inadvertently be transcribed and may not be corrected upon review.

## 2020-09-24 NOTE — Patient Instructions (Signed)
I am going to start you on a new medicine called amitriptyline for your chronic abdominal pain.  Hopefully this will help both your pain and stimulate your appetite.  The main side effect is drowsiness so I would take it 1 hour before bedtime.  I am starting you on a very low-dose and we can titrate this up in 2 to 3 weeks if need be.  Continue on Remicade for your Crohn's disease.  Follow-up in 3 months or sooner if needed.  At [facility] we value your feedback. You may receive a survey about your visit today. Please share your experience as we strive to create trusting relationships with our patients to provide genuine, compassionate, quality care.  We appreciate your understanding and patience as we review any laboratory studies, imaging, and other diagnostic tests that are ordered as we care for you. Our office policy is 5 business days for review of these results, and any emergent or urgent results are addressed in a timely manner for your best interest. If you do not hear from our office in 1 week, please contact us.   We also encourage the use of MyChart, which contains your medical information for your review as well. If you are not enrolled in this feature, an access code is on this after visit summary for your convenience. Thank you for allowing [place] to be involved in your care.  It was great to see you today!  I hope you have a great rest of your fall!!    Celia Gibbons K. Abbey Chatters, D.O. Gastroenterology and Hepatology [facility]

## 2020-09-25 ENCOUNTER — Encounter (HOSPITAL_COMMUNITY)
Admission: RE | Admit: 2020-09-25 | Discharge: 2020-09-25 | Disposition: A | Discharge status: Home / Self Care | Payer: Medicare Other | Source: Ambulatory Visit | Attending: Gastroenterology | Admitting: Gastroenterology

## 2020-09-25 ENCOUNTER — Encounter (HOSPITAL_COMMUNITY): Payer: Self-pay

## 2020-09-25 DIAGNOSIS — K5090 Crohn's disease, unspecified, without complications: Secondary | ICD-10-CM | POA: Diagnosis not present

## 2020-09-25 MED ORDER — SODIUM CHLORIDE 0.9 % IV SOLN
300.0000 mg | Freq: Once | INTRAVENOUS | Status: AC
  Administered 2020-09-25: 300 mg via INTRAVENOUS
  Filled 2020-09-25: qty 30

## 2020-09-25 MED ORDER — ACETAMINOPHEN 325 MG PO TABS
650.0000 mg | ORAL_TABLET | Freq: Once | ORAL | Status: AC
  Administered 2020-09-25: 650 mg via ORAL

## 2020-09-25 MED ORDER — LORATADINE 10 MG PO TABS
10.0000 mg | ORAL_TABLET | Freq: Once | ORAL | Status: AC
  Administered 2020-09-25: 10 mg via ORAL

## 2020-09-25 MED ORDER — SODIUM CHLORIDE 0.9 % IV SOLN: Freq: Once | INTRAVENOUS | Status: AC

## 2020-09-30 DIAGNOSIS — N6489 Other specified disorders of breast: Secondary | ICD-10-CM | POA: Diagnosis not present

## 2020-09-30 DIAGNOSIS — N644 Mastodynia: Secondary | ICD-10-CM | POA: Diagnosis not present

## 2020-09-30 DIAGNOSIS — N6311 Unspecified lump in the right breast, upper outer quadrant: Secondary | ICD-10-CM | POA: Diagnosis not present

## 2020-10-06 ENCOUNTER — Telehealth: Payer: Self-pay

## 2020-10-06 NOTE — Telephone Encounter (Signed)
Pt. Called and wanted something else called in for her weight loss. States Amitriptyline has had her sleeping for 2 days. Stated she didn't need anything for anxiety/depression only for her to gain weight. She would something called to [facility] [place].

## 2020-10-08 NOTE — Telephone Encounter (Signed)
Amitriptyline in low doses which she is on is used for neuropathic pain.  Much higher doses are usually needed for anxiety and depression treatment.  Has this helped with her pain at all?

## 2020-10-12 ENCOUNTER — Other Ambulatory Visit: Payer: Self-pay | Admitting: Gastroenterology

## 2020-10-12 DIAGNOSIS — R1084 Generalized abdominal pain: Secondary | ICD-10-CM

## 2020-10-13 NOTE — Telephone Encounter (Signed)
Dr. Abbey Chatters I spoke with the pt shes not having pain she just wants a weight loss Rx. Nothing for pain, depression, or anxiety. Please advise

## 2020-10-17 ENCOUNTER — Other Ambulatory Visit: Payer: Self-pay | Admitting: Internal Medicine

## 2020-10-21 DIAGNOSIS — N898 Other specified noninflammatory disorders of vagina: Secondary | ICD-10-CM | POA: Diagnosis not present

## 2020-10-21 DIAGNOSIS — B009 Herpesviral infection, unspecified: Secondary | ICD-10-CM | POA: Diagnosis not present

## 2020-11-06 ENCOUNTER — Encounter (HOSPITAL_COMMUNITY): Admission: RE | Admit: 2020-11-06 | Payer: Medicare Other | Source: Ambulatory Visit

## 2020-11-13 ENCOUNTER — Encounter (HOSPITAL_COMMUNITY)
Admission: RE | Admit: 2020-11-13 | Discharge: 2020-11-13 | Disposition: A | Discharge status: Home / Self Care | Payer: Medicare Other | Source: Ambulatory Visit | Attending: Gastroenterology | Admitting: Gastroenterology

## 2020-11-13 ENCOUNTER — Other Ambulatory Visit: Payer: Self-pay

## 2020-11-13 ENCOUNTER — Encounter (HOSPITAL_COMMUNITY): Payer: Self-pay

## 2020-11-13 DIAGNOSIS — K5090 Crohn's disease, unspecified, without complications: Secondary | ICD-10-CM | POA: Diagnosis not present

## 2020-11-13 MED ORDER — LORATADINE 10 MG PO TABS
10.0000 mg | ORAL_TABLET | Freq: Once | ORAL | Status: AC
  Administered 2020-11-13: 11:00:00 10 mg via ORAL

## 2020-11-13 MED ORDER — ACETAMINOPHEN 325 MG PO TABS
650.0000 mg | ORAL_TABLET | Freq: Once | ORAL | Status: AC
  Administered 2020-11-13: 11:00:00 650 mg via ORAL

## 2020-11-13 MED ORDER — SODIUM CHLORIDE 0.9 % IV SOLN
300.0000 mg | Freq: Once | INTRAVENOUS | Status: AC
  Administered 2020-11-13: 11:00:00 300 mg via INTRAVENOUS
  Filled 2020-11-13: qty 30

## 2020-11-13 MED ORDER — SODIUM CHLORIDE 0.9 % IV SOLN: Freq: Once | INTRAVENOUS | Status: AC

## 2020-11-16 ENCOUNTER — Telehealth: Payer: Self-pay

## 2020-11-16 ENCOUNTER — Encounter: Payer: Self-pay | Admitting: Internal Medicine

## 2020-11-16 NOTE — Telephone Encounter (Signed)
Hey Dr. Abbey Chatters, This pt is telling me something different today. Pt wants a appetite stimulant. She's not in any pain, nor does she need a depression medication. States she just wants a Rx to give her a appetite. Please advise.

## 2020-11-17 DIAGNOSIS — A6000 Herpesviral infection of urogenital system, unspecified: Secondary | ICD-10-CM | POA: Diagnosis not present

## 2020-11-17 DIAGNOSIS — K50913 Crohn's disease, unspecified, with fistula: Secondary | ICD-10-CM | POA: Diagnosis not present

## 2020-11-23 ENCOUNTER — Telehealth: Payer: Self-pay | Admitting: Internal Medicine

## 2020-11-23 NOTE — Telephone Encounter (Signed)
Hi Dr. Abbey Chatters Pt Kendra Franklin phoned this morning for pain meds with Kendra Franklin, but I called her and she describes her pain level is a 10. Her pain is lower abdomen area, no fever. She has an appt with Vicente Males on 12/17/2020 @ 8am. The pt would not give more details regarding her situation. She states she wanted pain meds. I checked her chart and seen where she had been to see Dr. Hollice Gong in [place] and lower abd pain could be contributed to that or her  GI medical conditions. Please advise

## 2020-11-23 NOTE — Telephone Encounter (Signed)
If she is having 10/10 pain, she needs to be evaluated in the ER. Thank you

## 2020-11-23 NOTE — Telephone Encounter (Signed)
[phone] patient called and said that she was having bad abd pain and wanted something sent in for pain, I told her that is not usually something we prescribe but I would have a nurse call her

## 2020-11-24 NOTE — Telephone Encounter (Signed)
Phoned and spoke to the pt and she stated since its a day later that she is better and she will just follow up with Vicente Males on 12/17/2020 @ 8am.

## 2020-12-17 ENCOUNTER — Ambulatory Visit: Payer: Medicare Other | Admitting: Gastroenterology

## 2020-12-25 ENCOUNTER — Encounter (HOSPITAL_COMMUNITY)
Admission: RE | Admit: 2020-12-25 | Discharge: 2020-12-25 | Disposition: A | Discharge status: Home / Self Care | Payer: Medicare Other | Source: Ambulatory Visit | Attending: Gastroenterology | Admitting: Gastroenterology

## 2020-12-25 ENCOUNTER — Other Ambulatory Visit: Payer: Self-pay

## 2020-12-25 ENCOUNTER — Encounter (HOSPITAL_COMMUNITY): Payer: Self-pay

## 2020-12-25 DIAGNOSIS — K5090 Crohn's disease, unspecified, without complications: Secondary | ICD-10-CM | POA: Diagnosis not present

## 2020-12-25 MED ORDER — LORATADINE 10 MG PO TABS
10.0000 mg | ORAL_TABLET | Freq: Once | ORAL | Status: AC
  Administered 2020-12-25: 10 mg via ORAL

## 2020-12-25 MED ORDER — SODIUM CHLORIDE 0.9 % IV SOLN: Freq: Once | INTRAVENOUS | Status: AC

## 2020-12-25 MED ORDER — ACETAMINOPHEN 325 MG PO TABS
650.0000 mg | ORAL_TABLET | Freq: Once | ORAL | Status: AC
  Administered 2020-12-25: 650 mg via ORAL

## 2020-12-25 MED ORDER — SODIUM CHLORIDE 0.9 % IV SOLN
300.0000 mg | Freq: Once | INTRAVENOUS | Status: AC
  Administered 2020-12-25: 300 mg via INTRAVENOUS
  Filled 2020-12-25: qty 30

## 2020-12-28 DIAGNOSIS — K5090 Crohn's disease, unspecified, without complications: Secondary | ICD-10-CM | POA: Diagnosis not present

## 2020-12-28 DIAGNOSIS — Z299 Encounter for prophylactic measures, unspecified: Secondary | ICD-10-CM | POA: Diagnosis not present

## 2021-01-08 DIAGNOSIS — Z3202 Encounter for pregnancy test, result negative: Secondary | ICD-10-CM | POA: Diagnosis not present

## 2021-01-08 DIAGNOSIS — Z30013 Encounter for initial prescription of injectable contraceptive: Secondary | ICD-10-CM | POA: Diagnosis not present

## 2021-01-14 DIAGNOSIS — Z3202 Encounter for pregnancy test, result negative: Secondary | ICD-10-CM | POA: Diagnosis not present

## 2021-01-21 DIAGNOSIS — L280 Lichen simplex chronicus: Secondary | ICD-10-CM | POA: Diagnosis not present

## 2021-01-21 DIAGNOSIS — N898 Other specified noninflammatory disorders of vagina: Secondary | ICD-10-CM | POA: Diagnosis not present

## 2021-01-21 DIAGNOSIS — A6000 Herpesviral infection of urogenital system, unspecified: Secondary | ICD-10-CM | POA: Diagnosis not present

## 2021-01-21 DIAGNOSIS — K50913 Crohn's disease, unspecified, with fistula: Secondary | ICD-10-CM | POA: Diagnosis not present

## 2021-01-22 ENCOUNTER — Encounter: Payer: Self-pay | Admitting: *Deleted

## 2021-01-22 ENCOUNTER — Encounter: Payer: Self-pay | Admitting: Gastroenterology

## 2021-01-22 ENCOUNTER — Other Ambulatory Visit: Payer: Self-pay | Admitting: *Deleted

## 2021-01-22 ENCOUNTER — Other Ambulatory Visit: Payer: Self-pay

## 2021-01-22 ENCOUNTER — Ambulatory Visit (INDEPENDENT_AMBULATORY_CARE_PROVIDER_SITE_OTHER): Payer: Medicare Other | Admitting: Gastroenterology

## 2021-01-22 ENCOUNTER — Encounter: Payer: Self-pay | Admitting: Internal Medicine

## 2021-01-22 VITALS — BP 107/68 | HR 84 | Temp 97.5°F | Ht 64.0 in | Wt 104.6 lb

## 2021-01-22 DIAGNOSIS — R112 Nausea with vomiting, unspecified: Secondary | ICD-10-CM | POA: Diagnosis not present

## 2021-01-22 DIAGNOSIS — R634 Abnormal weight loss: Secondary | ICD-10-CM

## 2021-01-22 DIAGNOSIS — Z79899 Other long term (current) drug therapy: Secondary | ICD-10-CM | POA: Diagnosis not present

## 2021-01-22 DIAGNOSIS — R1033 Periumbilical pain: Secondary | ICD-10-CM

## 2021-01-22 MED ORDER — DRONABINOL 2.5 MG PO CAPS: 2.5000 mg | ORAL_CAPSULE | Freq: Two times a day (BID) | ORAL | 1 refills | Status: DC

## 2021-01-22 MED ORDER — PROMETHAZINE HCL 25 MG PO TABS: 12.5000 mg | ORAL_TABLET | Freq: Three times a day (TID) | ORAL | 0 refills | Status: DC | PRN

## 2021-01-22 MED ORDER — HYOSCYAMINE SULFATE 0.125 MG SL SUBL: 0.1250 mg | SUBLINGUAL_TABLET | SUBLINGUAL | 0 refills | Status: DC | PRN

## 2021-01-22 NOTE — Patient Instructions (Signed)
Please have blood work done today.  I have ordered a CT scan.  We will try Marinol twice a day before meals. I have sent in phenergan to take only for severe nausea and vomiting. This can cause marked drowsiness, sleepiness. Make sure to take only when at home and not when driving.   I have also sent in Levsin to take every 4-6 hours for abdominal cramping. This can cause constipation, dry mouth, dizziness. If this happens, then stop. Do not take Bentyl while taking this.  We will see you in 6-8 weeds!   I enjoyed seeing you again today! As you know, I value our relationship and want to provide genuine, compassionate, and quality care. I welcome your feedback. If you receive a survey regarding your visit,  I greatly appreciate you taking time to fill this out. See you next time!  Annitta Needs, PhD, ANP-BC [facility]

## 2021-01-22 NOTE — Progress Notes (Signed)
Referring Provider: Monico Blitz, MD Primary Care Physician:  Monico Blitz, MD Primary GI: Dr. Abbey Chatters  Chief Complaint  Patient presents with  . Crohn's Disease    HPI:   Kendra Franklin is a 30 y.o. female presenting today with a history of small bowel Crohn's disease diagnosed in 2011 at age 82, perianal fistula, and osteoporosis.  Most recent DEXA scan March 2020 consistent with osteoporosis. Last EGD and colonoscopy January 2019 with mild gastritis benign duodenal biopsies, normal colon, slight inflammation in the rectum with negative biopsies throughout.  She underwent capsule endoscopy February 2019 with mild gastritis normal small bowel.  CT enterography May 2020 with no active Crohn's disease. She has seen [facility] in the past, visit 11/12/2019, who felt her symptoms consistent with IBS and visceral hypersensitivity.  She was placed on mirtazapine which she states "knocked her out."  She is also tried Bentyl with no relief in her symptoms.   Maintained on Remicade, with trough levels in Feb 2021 normal and no antibodies. Perimubilical pain has been long-standing, worsened on Imuran. Started on amitrtyptilline 10 mg nightly in Oct 2021.   States that amitriptyline didn't help. Continues with periumbilical abdominal pain, taking probiotic and curamin. Using a "natural" tea as well to help with dry heaves. Has N/V. No appetite. Abdominal pain is constant. Sitting up hurts. N/V daily. Not associated with eating. Bloating for past year after eating. No GERD. Sometimes dysphagia with pills. No early satiety. Forces self to eat. Cold/tired. BM daily X 2. NO rectal bleeding. Trying to wean off gluten foods.   Low, steady weight loss noted over past year.   Past Medical History:  Diagnosis Date  . Allergy    medicine  . Bloody diarrhea 03/11/2015  . Crohn's    DX   DEC 2011--- ASCA 100 pANCA NEG-APR 2012 6-TGN 233(LLN 230)  6-MMPN 1173 ON 75 MG IMURAN  . Crohn's disease, small and large  intestine ([place])   . History of anal fissures   . History of anal lesion    2009   NON--HEALING  . History of kidney stones   . History of small intestine ulcer    2011  . Osteoporosis     Past Surgical History:  Procedure Laterality Date  . COLONOSCOPY  DEC 2011   ILEO-COLONIC ULCERS  . COLONOSCOPY N/A 03/16/2015   SLF: 1/ The examined terminal ileum appeared to be normal 2. The left colon is redundatnt 3. Rectal bleeding due to small internal hemorrhoids  . COLONOSCOPY N/A 12/26/2017   Procedure: COLONOSCOPY;  Surgeon: Danie Binder, MD;  Examined ileum was normal, significant looping of the colon, diffuse mild inflammation in the rectum consistent with Crohn's disease s/p biopsy and random colon biopsies.  Pathology was benign.  . CYSTOSCOPY WITH RETROGRADE PYELOGRAM, URETEROSCOPY AND STENT PLACEMENT Left 02/27/2014   Procedure: CYSTOSCOPY WITH RETROGRADE PYELOGRAM, URETEROSCOPY/ STENT PLACEMENT;  Surgeon: Alexis Frock, MD;  Location: [facility];  Service: Urology;  Laterality: Left;  . ESOPHAGOGASTRODUODENOSCOPY N/A 03/16/2015   SLF: 1. epigastric pain most likely due to gastritis/pyschosocial stressors, less likely GERD. 2. Mild non-erosive gastritis.   Marland Kitchen ESOPHAGOGASTRODUODENOSCOPY N/A 12/26/2017   Procedure: ESOPHAGOGASTRODUODENOSCOPY (EGD);  Surgeon: Danie Binder, MD;  Normal examined esophagus, mild gastritis s/p biopsy, normal examined duodenum.  Gastric biopsy with mild chronic gastritis, duodenal biopsy benign.  Marland Kitchen FLEXIBLE SIGMOIDOSCOPY N/A 12/11/2013   Procedure: FLEXIBLE SIGMOIDOSCOPY;  Surgeon: Danie Binder, MD;  [facility];  Service: Endoscopy;  Laterality: N/A;  1:45 PM  . GIVENS CAPSULE STUDY  DEC 2011   SB ULCERS  . GIVENS CAPSULE STUDY N/A 01/08/2018   Procedure: GIVENS CAPSULE STUDY;  Surgeon: Danie Binder, MD;  Occasional erosion in the gastric mucosa, small bowel with no ulcers, masses, or AVMs.  Marland Kitchen HOLMIUM LASER APPLICATION Left  01/07/5620   Procedure: HOLMIUM LASER APPLICATION;  Surgeon: Alexis Frock, MD;  Location: [facility];  Service: Urology;  Laterality: Left;  . INCISION AND DRAINAGE PERIRECTAL ABSCESS  2009  &  2010    Current Outpatient Medications  Medication Sig Dispense Refill  . alendronate (FOSAMAX) 35 MG tablet Take 1 tablet (35 mg total) by mouth every 7 (seven) days. Take with a full glass of water on an empty stomach. 12 tablet 3  . Cholecalciferol (VITAMIN D3) 125 MCG (5000 UT) CAPS Take 1 capsule (5,000 Units total) by mouth daily. 90 capsule 1  . dronabinol (MARINOL) 2.5 MG capsule Take 1 capsule (2.5 mg total) by mouth 2 (two) times daily before a meal. 60 capsule 1  . hyoscyamine (LEVSIN SL) 0.125 MG SL tablet Place 1 tablet (0.125 mg total) under the tongue every 4 (four) hours as needed. For cramping 30 tablet 0  . inFLIXimab (REMICADE) 100 MG injection Inject 300 mg into the vein every 6 (six) weeks. LAST INJECTION NOV 2017    . promethazine (PHENERGAN) 25 MG tablet Take 0.5 tablets (12.5 mg total) by mouth every 8 (eight) hours as needed for refractory nausea / vomiting. 30 tablet 0   No current facility-administered medications for this visit.    Allergies as of 01/22/2021 - Review Complete 01/22/2021  Allergen Reaction Noted  . Morphine Hives   . Other Rash 06/20/2018    Family History  Problem Relation Age of Onset  . Diabetes Maternal Grandmother   . Colon cancer Neg Hx   . Colon polyps Neg Hx   . Inflammatory bowel disease Neg Hx     Social History   Socioeconomic History  . Marital status: Single    Spouse name: Not on file  . Number of children: 0  . Years of education: BSW  . Highest education level: Bachelor's degree (e.g., BA, AB, BS)  Occupational History  . Occupation: Ship broker  Tobacco Use  . Smoking status: Never Smoker  . Smokeless tobacco: Never Used  Vaping Use  . Vaping Use: Never used  Substance and Sexual Activity  . Alcohol use: Not  Currently    Comment: RARE  . Drug use: No  . Sexual activity: Not Currently  Other Topics Concern  . Not on file  Social History Narrative   Graduated from [employer]. Straight A student.    Now at [employer] in Masters of Gerontology program (as of Oct 3086)     Mother: Arlyss Repress. No siblings. Mother smokes.   Lives at home w/ her mother   Right-handed   Caffeine: denies   Social Determinants of Radio broadcast assistant Strain: Not on file  Food Insecurity: Not on file  Transportation Needs: Not on file  Physical Activity: Not on file  Stress: Not on file  Social Connections: Not on file    Review of Systems: Gen: see HPI CV: Denies chest pain, palpitations, syncope, peripheral edema, and claudication. Resp: Denies dyspnea at rest, cough, wheezing, coughing up blood, and pleurisy. GI: see HPI Derm: Denies rash, itching, dry skin Psych: Denies depression, anxiety, memory loss, confusion. No  homicidal or suicidal ideation.  Heme: Denies bruising, bleeding, and enlarged lymph nodes.  Physical Exam: BP 107/68   Pulse 84   Temp (!) 97.5 F (36.4 C) (Temporal)   Ht 5' 4"  (1.626 m)   Wt 104 lb 9.6 oz (47.4 kg)   LMP 12/25/2020 Comment: started back on Depo  BMI 17.95 kg/m  General:   Alert and oriented. No distress noted. Pleasant and cooperative.  Head:  Normocephalic and atraumatic. Eyes:  Conjuctiva clear without scleral icterus. Mouth:  Mask in place Abdomen:  +BS, soft, TTP periumbilically and non-distended. No rebound or guarding. No HSM or masses noted. Msk:  Symmetrical without gross deformities. Normal posture. Extremities:  Without edema. Neurologic:  Alert and  oriented x4 Psych:  Alert and cooperative. Normal mood and affect.  ASSESSMENT/PLAN: SALAH BURLISON is a 30 y.o. female presenting today with a history of small bowel Crohn's disease diagnosed in 2011 at age 22, perianal fistula, and osteoporosis.  Most recent DEXA scan March 2020 consistent with  osteoporosis. Last EGD and colonoscopy January 2019 with mild gastritis benign duodenal biopsies, normal colon, slight inflammation in the rectum with negative biopsies throughout.  She underwent capsule endoscopy February 2019 with mild gastritis normal small bowel.  CT enterography May 2020 with no active Crohn's disease. She has seen [facility] in the past, visit 11/12/2019, who felt her symptoms consistent with IBS and visceral hypersensitivity.  She was placed on mirtazapine which caused increased sedation, no improvement with dicyclomine, and most recently amitryptiline without improvement.   From a Crohn's standpoint, her disease is clinically quiescent. Likely functional abdominal pain. However, her weight loss is concerning. Associated N/V also persists. No typical GERD symptoms.   With persistent pain and weight loss, I have ordered a CT in the near future. Will also update labs to include celiac serologies, sed rate, CRP. Continue on Remicade (no antibodies detected recently).   Marinol BID provided as she has failed many other anti-emetics. Phenergan only for severe nausea. Trial of Levsin prn abdominal cramping.   Close follow-up in 6-8 weeks. Further recommendations to follow.  Annitta Needs, PhD, ANP-BC [facility]

## 2021-01-25 LAB — TISSUE TRANSGLUTAMINASE, IGA: (tTG) Ab, IgA: <1 U/mL

## 2021-01-25 LAB — CBC WITH DIFFERENTIAL/PLATELET
Absolute Monocytes: 238 cells/uL (ref 200–950)
Basophils Absolute: 20 cells/uL (ref 0–200)
Basophils Relative: 0.6 %
Eosinophils Absolute: 51 cells/uL (ref 15–500)
Eosinophils Relative: 1.5 %
HCT: 41 % (ref 35.0–45.0)
Hemoglobin: 14.1 g/dL (ref 11.7–15.5)
Lymphs Abs: 1554 cells/uL (ref 850–3900)
MCH: 32.9 pg (ref 27.0–33.0)
MCHC: 34.4 g/dL (ref 32.0–36.0)
MCV: 95.8 fL (ref 80.0–100.0)
MPV: 12.1 fL (ref 7.5–12.5)
Monocytes Relative: 7 %
Neutro Abs: 1537 cells/uL (ref 1500–7800)
Neutrophils Relative %: 45.2 %
Platelets: 195 10*3/uL (ref 140–400)
RBC: 4.28 10*6/uL (ref 3.80–5.10)
RDW: 12.6 % (ref 11.0–15.0)
Total Lymphocyte: 45.7 %
WBC: 3.4 10*3/uL — ABNORMAL LOW (ref 3.8–10.8)

## 2021-01-25 LAB — IRON,TIBC AND FERRITIN PANEL
%SAT: 41 % (calc) (ref 16–45)
Ferritin: 175 ng/mL — ABNORMAL HIGH (ref 16–154)
Iron: 113 ug/dL (ref 40–190)
TIBC: 277 mcg/dL (calc) (ref 250–450)

## 2021-01-25 LAB — IGA: Immunoglobulin A: 515 mg/dL — ABNORMAL HIGH (ref 47–310)

## 2021-01-25 LAB — C-REACTIVE PROTEIN: CRP: <0.2 mg/L (ref <8.0)

## 2021-01-25 LAB — SEDIMENTATION RATE: Sed Rate: 11 mm/h (ref 0–20)

## 2021-02-02 ENCOUNTER — Telehealth: Payer: Self-pay | Admitting: Internal Medicine

## 2021-02-02 NOTE — Telephone Encounter (Signed)
Returned the pt's call and was advised that her Rx for Marinol wasn't filled yet. I advised the pt I will check with Cover My Meds to see if they have sent me anything and if so hopefully we can get this approved for the pt.

## 2021-02-02 NOTE — Telephone Encounter (Signed)
Pt called asking for Gala Romney NP nurse. I told her the nurse was with patients and would have to call her back. She said it was regarding her medications. Please call [phone]

## 2021-02-05 ENCOUNTER — Encounter (HOSPITAL_COMMUNITY): Admission: RE | Admit: 2021-02-05 | Payer: Medicare Other | Source: Ambulatory Visit

## 2021-02-08 ENCOUNTER — Other Ambulatory Visit: Payer: Self-pay | Admitting: Nurse Practitioner

## 2021-02-08 DIAGNOSIS — R630 Anorexia: Secondary | ICD-10-CM

## 2021-02-09 ENCOUNTER — Other Ambulatory Visit: Payer: Self-pay

## 2021-02-09 ENCOUNTER — Encounter (HOSPITAL_COMMUNITY)
Admission: RE | Admit: 2021-02-09 | Discharge: 2021-02-09 | Disposition: A | Discharge status: Home / Self Care | Payer: Medicare Other | Source: Ambulatory Visit | Attending: Gastroenterology | Admitting: Gastroenterology

## 2021-02-09 ENCOUNTER — Encounter (HOSPITAL_COMMUNITY): Payer: Self-pay

## 2021-02-09 DIAGNOSIS — K5090 Crohn's disease, unspecified, without complications: Secondary | ICD-10-CM | POA: Diagnosis not present

## 2021-02-09 MED ORDER — SODIUM CHLORIDE 0.9 % IV SOLN: Freq: Once | INTRAVENOUS | Status: AC

## 2021-02-09 MED ORDER — LORATADINE 10 MG PO TABS: 10.0000 mg | ORAL_TABLET | Freq: Once | ORAL | Status: AC

## 2021-02-09 MED ORDER — LORATADINE 10 MG PO TABS
ORAL_TABLET | ORAL | Status: AC
  Administered 2021-02-09: 10 mg via ORAL
  Filled 2021-02-09: qty 1

## 2021-02-09 MED ORDER — SODIUM CHLORIDE 0.9 % IV SOLN
300.0000 mg | Freq: Once | INTRAVENOUS | Status: AC
  Administered 2021-02-09: 300 mg via INTRAVENOUS
  Filled 2021-02-09: qty 30

## 2021-02-09 MED ORDER — ACETAMINOPHEN 325 MG PO TABS
ORAL_TABLET | ORAL | Status: AC
  Administered 2021-02-09: 650 mg via ORAL
  Filled 2021-02-09: qty 2

## 2021-02-09 MED ORDER — ACETAMINOPHEN 325 MG PO TABS: 650.0000 mg | ORAL_TABLET | Freq: Once | ORAL | Status: AC

## 2021-02-10 NOTE — Telephone Encounter (Signed)
Please clarify. Is patient on remeron? Was not on the list when last seen by Vicente Males.

## 2021-02-12 ENCOUNTER — Telehealth: Payer: Self-pay

## 2021-02-12 NOTE — Telephone Encounter (Signed)
Pt is not wanting this Rx refilled

## 2021-02-12 NOTE — Telephone Encounter (Signed)
Phoned and spoke with the pt and advised why her Marinol cannot be approved. Pt was given a GoodRx card with a list of pharmacies and their prices. Advised the pt that I was leaving it up front for her to pick up. She agreed to pick it up Monday.

## 2021-02-16 ENCOUNTER — Ambulatory Visit (HOSPITAL_COMMUNITY): Admission: RE | Admit: 2021-02-16 | Payer: Medicare Other | Source: Ambulatory Visit

## 2021-02-23 ENCOUNTER — Other Ambulatory Visit: Payer: Self-pay

## 2021-02-23 ENCOUNTER — Ambulatory Visit (HOSPITAL_COMMUNITY)
Admission: RE | Admit: 2021-02-23 | Discharge: 2021-02-23 | Disposition: A | Discharge status: Home / Self Care | Payer: Medicare Other | Source: Ambulatory Visit | Attending: "Endocrinology | Admitting: "Endocrinology

## 2021-02-23 DIAGNOSIS — E559 Vitamin D deficiency, unspecified: Secondary | ICD-10-CM | POA: Insufficient documentation

## 2021-02-23 DIAGNOSIS — T380X5A Adverse effect of glucocorticoids and synthetic analogues, initial encounter: Secondary | ICD-10-CM | POA: Insufficient documentation

## 2021-02-23 DIAGNOSIS — M818 Other osteoporosis without current pathological fracture: Secondary | ICD-10-CM | POA: Diagnosis not present

## 2021-02-23 DIAGNOSIS — M810 Age-related osteoporosis without current pathological fracture: Secondary | ICD-10-CM | POA: Diagnosis not present

## 2021-03-11 DIAGNOSIS — K130 Diseases of lips: Secondary | ICD-10-CM | POA: Diagnosis not present

## 2021-03-15 ENCOUNTER — Ambulatory Visit: Payer: Medicare Other | Admitting: "Endocrinology

## 2021-03-23 ENCOUNTER — Ambulatory Visit (INDEPENDENT_AMBULATORY_CARE_PROVIDER_SITE_OTHER): Payer: Medicare Other | Admitting: Gastroenterology

## 2021-03-23 ENCOUNTER — Encounter: Payer: Self-pay | Admitting: Gastroenterology

## 2021-03-23 ENCOUNTER — Other Ambulatory Visit: Payer: Self-pay

## 2021-03-23 VITALS — BP 119/78 | HR 88 | Temp 97.3°F | Ht 64.0 in | Wt 101.6 lb

## 2021-03-23 DIAGNOSIS — R634 Abnormal weight loss: Secondary | ICD-10-CM | POA: Diagnosis not present

## 2021-03-23 DIAGNOSIS — K5090 Crohn's disease, unspecified, without complications: Secondary | ICD-10-CM | POA: Diagnosis not present

## 2021-03-23 NOTE — Progress Notes (Signed)
Referring Provider: Monico Blitz, MD Primary Care Physician:  Monico Blitz, MD Primary GI: Dr. Abbey Chatters  Chief Complaint  Patient presents with  . Nausea    Not as constant  . Abdominal Pain    None in 9 days    HPI:   Kendra Franklin is a 30 y.o. female presenting today with a history of  small bowel Crohn's disease diagnosed in 2011 at age 82, perianal fistula, and osteoporosis. Most recent DEXA scan March 2022 consistent with osteoporosis. Last EGD and colonoscopy January 2019 with mild gastritis benign duodenal biopsies, normal colon, slight inflammation in the rectum with negative biopsies throughout. She underwent capsule endoscopy February 2019 with mild gastritis normal small bowel. CT enterography May 2020 with no active Crohn's disease. She has seen [facility] in the past, visit 11/12/2019,who felt her symptoms consistent with IBS and visceral hypersensitivity. She was placed on mirtazapine which she states "knocked her out." She is also tried Bentyl with no relief in her symptoms. Maintained on Remicade, with trough levels in Feb 2021 normal and no antibodies. Perimubilical pain has been long-standing, worsened on Imuran. amitryptilline without improvement in abdominal pain.   When last seen, she was having issues with N/V, no appetite, and steady weight loss over the past year. Negative celiac serologies. Ferritin was 175 but sats 41. Will recheck this in May/June. CRP and sed rate normal. CT scheduled for tomorrow.   Weight 101. SHe has lost additional weight from visit in Feb 2022 of 104.    Nausea improved: found a pill called Crohn's and colitis pill. Taking 3 weeks and pain-free for 9 days. Doesn't want to take Remicade any longer. Not going to take it any longer. Appetite is better. Doesn't feel as sick as has been. Still using the tea and ginger.   Past Medical History:  Diagnosis Date  . Allergy    medicine  . Bloody diarrhea 03/11/2015  . Crohn's    DX   DEC  2011--- ASCA 100 pANCA NEG-APR 2012 6-TGN 233(LLN 230)  6-MMPN 1173 ON 75 MG IMURAN  . Crohn's disease, small and large intestine ([place])   . History of anal fissures   . History of anal lesion    2009   NON--HEALING  . History of kidney stones   . History of small intestine ulcer    2011  . Osteoporosis     Past Surgical History:  Procedure Laterality Date  . COLONOSCOPY  DEC 2011   ILEO-COLONIC ULCERS  . COLONOSCOPY N/A 03/16/2015   SLF: 1/ The examined terminal ileum appeared to be normal 2. The left colon is redundatnt 3. Rectal bleeding due to small internal hemorrhoids  . COLONOSCOPY N/A 12/26/2017   Procedure: COLONOSCOPY;  Surgeon: Danie Binder, MD;  Examined ileum was normal, significant looping of the colon, diffuse mild inflammation in the rectum consistent with Crohn's disease s/p biopsy and random colon biopsies.  Pathology was benign.  . CYSTOSCOPY WITH RETROGRADE PYELOGRAM, URETEROSCOPY AND STENT PLACEMENT Left 02/27/2014   Procedure: CYSTOSCOPY WITH RETROGRADE PYELOGRAM, URETEROSCOPY/ STENT PLACEMENT;  Surgeon: Alexis Frock, MD;  [facility];  Service: Urology;  Laterality: Left;  . ESOPHAGOGASTRODUODENOSCOPY N/A 03/16/2015   SLF: 1. epigastric pain most likely due to gastritis/pyschosocial stressors, less likely GERD. 2. Mild non-erosive gastritis.   Marland Kitchen ESOPHAGOGASTRODUODENOSCOPY N/A 12/26/2017   Procedure: ESOPHAGOGASTRODUODENOSCOPY (EGD);  Surgeon: Danie Binder, MD;  Normal examined esophagus, mild gastritis s/p biopsy, normal examined duodenum.  Gastric  biopsy with mild chronic gastritis, duodenal biopsy benign.  Marland Kitchen FLEXIBLE SIGMOIDOSCOPY N/A 12/11/2013   Procedure: FLEXIBLE SIGMOIDOSCOPY;  Surgeon: Danie Binder, MD;  [facility];  Service: Endoscopy;  Laterality: N/A;  1:45 PM  . GIVENS CAPSULE STUDY  DEC 2011   SB ULCERS  . GIVENS CAPSULE STUDY N/A 01/08/2018   Procedure: GIVENS CAPSULE STUDY;  Surgeon: Danie Binder, MD;   Occasional erosion in the gastric mucosa, small bowel with no ulcers, masses, or AVMs.  Marland Kitchen HOLMIUM LASER APPLICATION Left 05/04/6194   Procedure: HOLMIUM LASER APPLICATION;  Surgeon: Alexis Frock, MD;  Location: [facility];  Service: Urology;  Laterality: Left;  . INCISION AND DRAINAGE PERIRECTAL ABSCESS  2009  &  2010    Current Outpatient Medications  Medication Sig Dispense Refill  . Cholecalciferol (VITAMIN D3) 125 MCG (5000 UT) CAPS Take 1 capsule (5,000 Units total) by mouth daily. 90 capsule 1  . hyoscyamine (LEVSIN SL) 0.125 MG SL tablet Place 1 tablet (0.125 mg total) under the tongue every 4 (four) hours as needed. For cramping 30 tablet 0  . inFLIXimab (REMICADE) 100 MG injection Inject 300 mg into the vein every 6 (six) weeks. LAST INJECTION NOV 2017    . medroxyPROGESTERone (DEPO-PROVERA) 150 MG/ML injection Inject 150 mg into the muscle every 3 (three) months. Due in May 2022    . alendronate (FOSAMAX) 35 MG tablet Take 1 tablet (35 mg total) by mouth every 7 (seven) days. Take with a full glass of water on an empty stomach. (Patient not taking: Reported on 03/23/2021) 12 tablet 3  . dronabinol (MARINOL) 2.5 MG capsule Take 1 capsule (2.5 mg total) by mouth 2 (two) times daily before a meal. (Patient not taking: Reported on 03/23/2021) 60 capsule 1  . promethazine (PHENERGAN) 25 MG tablet Take 0.5 tablets (12.5 mg total) by mouth every 8 (eight) hours as needed for refractory nausea / vomiting. (Patient not taking: Reported on 03/23/2021) 30 tablet 0   No current facility-administered medications for this visit.    Allergies as of 03/23/2021 - Review Complete 03/23/2021  Allergen Reaction Noted  . Morphine Hives   . Other Rash 06/20/2018    Family History  Problem Relation Age of Onset  . Diabetes Maternal Grandmother   . Colon cancer Neg Hx   . Colon polyps Neg Hx   . Inflammatory bowel disease Neg Hx     Social History   Socioeconomic History  . Marital  status: Single    Spouse name: Not on file  . Number of children: 0  . Years of education: BSW  . Highest education level: Bachelor's degree (e.g., BA, AB, BS)  Occupational History  . Occupation: Ship broker  Tobacco Use  . Smoking status: Never Smoker  . Smokeless tobacco: Never Used  Vaping Use  . Vaping Use: Never used  Substance and Sexual Activity  . Alcohol use: Not Currently    Comment: RARE  . Drug use: No  . Sexual activity: Not Currently  Other Topics Concern  . Not on file  Social History Narrative   Graduated from [employer]. Straight A student.    Now at [employer] in Masters of Gerontology program (as of Oct 0932)     Mother: Arlyss Repress. No siblings. Mother smokes.   Lives at home w/ her mother   Right-handed   Caffeine: denies   Social Determinants of Health   Financial Resource Strain: Not on file  Food Insecurity: Not on  file  Transportation Needs: Not on file  Physical Activity: Not on file  Stress: Not on file  Social Connections: Not on file    Review of Systems: As mentioned in HPI  Physical Exam: BP 119/78   Pulse 88   Temp (!) 97.3 F (36.3 C)   Ht 5' 4"  (1.626 m)   Wt 101 lb 9.6 oz (46.1 kg)   LMP 03/07/2021   BMI 17.44 kg/m  General:   Alert and oriented. No distress noted. Pleasant and cooperative.  Head:  Normocephalic and atraumatic. Eyes:  Conjuctiva clear without scleral icterus. Mouth:  Mask in place Abdomen:  +BS, soft, mild lower TTP and non-distended. No rebound or guarding. No HSM or masses noted. Msk:  Symmetrical without gross deformities. Normal posture. Extremities:  Without edema. Neurologic:  Alert and  oriented x4 Psych:  Alert and cooperative. Normal mood and affect.  ASSESSMENT: Kendra HAFF is a 30 y.o. female presenting today  history of  small bowel Crohn's disease diagnosed in 2011 at age 53, perianal fistula, osteoporosis, chronic abdominal pain, and weight loss. Extensive evaluation as noted above.  Returns  today with further weight lost, but she notes improvement in abdomina pain and nausea after starting a supplement found online called "Crohn's and Colitis" pill. CT has been ordered and to be completed in near future.   At this time, she is declining Remicade. She does not want to start a different biologic agent. She is aware the risks of flares, worsening disease, and detrimental outcomes. I have encouraged her to continue Remicade, as there has been clinical  And histological remission, but she is declining to continue this.   PLAN:  Recheck iron studies due to mildly elevated in past CT as planned: patient is rescheduling as unable to do tomorrow Will need to discuss plan of care with Dr. Abbey Chatters, as she is now declining any further treatment at this time   Annitta Needs, PhD, ANP-BC [facility]enter[facility]

## 2021-03-23 NOTE — Patient Instructions (Signed)
We will try to get the scan scheduled for a better time.  I will talk with Dr. Abbey Chatters about appetite support.   We will see you in 6-8 weeks!  I enjoyed seeing you again today! As you know, I value our relationship and want to provide genuine, compassionate, and quality care. I welcome your feedback. If you receive a survey regarding your visit,  I greatly appreciate you taking time to fill this out. See you next time!  Annitta Needs, PhD, ANP-BC [facility]

## 2021-03-24 ENCOUNTER — Other Ambulatory Visit: Payer: Self-pay

## 2021-03-24 ENCOUNTER — Ambulatory Visit (HOSPITAL_COMMUNITY): Payer: Medicare Other

## 2021-03-24 DIAGNOSIS — R197 Diarrhea, unspecified: Secondary | ICD-10-CM

## 2021-03-24 DIAGNOSIS — K50913 Crohn's disease, unspecified, with fistula: Secondary | ICD-10-CM

## 2021-03-24 DIAGNOSIS — R634 Abnormal weight loss: Secondary | ICD-10-CM

## 2021-03-24 NOTE — Progress Notes (Signed)
IRON

## 2021-03-25 ENCOUNTER — Encounter (HOSPITAL_COMMUNITY)
Admission: RE | Admit: 2021-03-25 | Discharge: 2021-03-25 | Disposition: A | Discharge status: Home / Self Care | Payer: Medicare Other | Source: Ambulatory Visit | Attending: Gastroenterology | Admitting: Gastroenterology

## 2021-04-07 NOTE — Progress Notes (Signed)
Cc'ed to pcp °

## 2021-04-15 ENCOUNTER — Telehealth: Payer: Self-pay

## 2021-04-15 NOTE — Telephone Encounter (Signed)
FYI:  The pt phoned today having 2 questions she needed the answer to. 1. Had [name] filled out paperwork she had left at the front office? I spoke with [name] up front, she gave it to Vicente Males a month ago she stated. I went to Vicente Males and was advised that the pt has to have her PCP fill out this form. It was for a handicap sticker.   2. Can we refer her to Dr. Olevia Perches office? Pt wants to transfer her care. Reba advised that Dr. Sydell Axon and Dr. Laural Golden have agreement in place that they don't see each other's patients. Pt will have to go to her PCP and be referred to [facility] or [place] facilities.  Spoke with the pt and all she said was ok .

## 2021-04-16 DIAGNOSIS — Z3042 Encounter for surveillance of injectable contraceptive: Secondary | ICD-10-CM | POA: Diagnosis not present

## 2021-04-18 ENCOUNTER — Other Ambulatory Visit: Payer: Self-pay | Admitting: Gastroenterology

## 2021-04-18 DIAGNOSIS — R1084 Generalized abdominal pain: Secondary | ICD-10-CM

## 2021-05-04 ENCOUNTER — Ambulatory Visit (HOSPITAL_COMMUNITY): Payer: Medicare Other

## 2021-05-05 ENCOUNTER — Telehealth: Payer: Self-pay

## 2021-05-05 NOTE — Telephone Encounter (Signed)
On 03/23/2021 office visit with [name] the pt declined to take Remicade. [name] made the pt aware of all the risks. I phoned to [facility] to speak with Hoyle Sauer regarding this and she does not have to fax [place] any longer regarding this pt.

## 2021-06-01 DIAGNOSIS — K50913 Crohn's disease, unspecified, with fistula: Secondary | ICD-10-CM | POA: Diagnosis not present

## 2021-06-01 DIAGNOSIS — R197 Diarrhea, unspecified: Secondary | ICD-10-CM | POA: Diagnosis not present

## 2021-06-01 DIAGNOSIS — R634 Abnormal weight loss: Secondary | ICD-10-CM | POA: Diagnosis not present

## 2021-06-02 LAB — IRON,TIBC AND FERRITIN PANEL
%SAT: 38 % (calc) (ref 16–45)
Ferritin: 142 ng/mL (ref 16–154)
Iron: 123 ug/dL (ref 40–190)
TIBC: 321 mcg/dL (calc) (ref 250–450)

## 2021-06-10 DIAGNOSIS — N760 Acute vaginitis: Secondary | ICD-10-CM | POA: Diagnosis not present

## 2021-06-22 ENCOUNTER — Ambulatory Visit: Payer: Medicare Other | Admitting: Gastroenterology

## 2021-06-30 DIAGNOSIS — G43009 Migraine without aura, not intractable, without status migrainosus: Secondary | ICD-10-CM | POA: Diagnosis not present

## 2021-06-30 DIAGNOSIS — Z1331 Encounter for screening for depression: Secondary | ICD-10-CM | POA: Diagnosis not present

## 2021-06-30 DIAGNOSIS — Z7189 Other specified counseling: Secondary | ICD-10-CM | POA: Diagnosis not present

## 2021-06-30 DIAGNOSIS — Z0000 Encounter for general adult medical examination without abnormal findings: Secondary | ICD-10-CM | POA: Diagnosis not present

## 2021-06-30 DIAGNOSIS — Z681 Body mass index (BMI) 19 or less, adult: Secondary | ICD-10-CM | POA: Diagnosis not present

## 2021-06-30 DIAGNOSIS — Z299 Encounter for prophylactic measures, unspecified: Secondary | ICD-10-CM | POA: Diagnosis not present

## 2021-06-30 DIAGNOSIS — Z1339 Encounter for screening examination for other mental health and behavioral disorders: Secondary | ICD-10-CM | POA: Diagnosis not present

## 2021-07-02 DIAGNOSIS — Z3042 Encounter for surveillance of injectable contraceptive: Secondary | ICD-10-CM | POA: Diagnosis not present

## 2021-08-19 DIAGNOSIS — N898 Other specified noninflammatory disorders of vagina: Secondary | ICD-10-CM | POA: Diagnosis not present

## 2021-10-08 DIAGNOSIS — K5090 Crohn's disease, unspecified, without complications: Secondary | ICD-10-CM | POA: Diagnosis not present

## 2021-10-08 DIAGNOSIS — Z299 Encounter for prophylactic measures, unspecified: Secondary | ICD-10-CM | POA: Diagnosis not present

## 2021-10-08 DIAGNOSIS — Z789 Other specified health status: Secondary | ICD-10-CM | POA: Diagnosis not present

## 2021-10-08 DIAGNOSIS — N200 Calculus of kidney: Secondary | ICD-10-CM | POA: Diagnosis not present

## 2021-10-08 DIAGNOSIS — K900 Celiac disease: Secondary | ICD-10-CM | POA: Diagnosis not present

## 2021-10-11 DIAGNOSIS — R935 Abnormal findings on diagnostic imaging of other abdominal regions, including retroperitoneum: Secondary | ICD-10-CM | POA: Diagnosis not present

## 2021-10-11 DIAGNOSIS — R109 Unspecified abdominal pain: Secondary | ICD-10-CM | POA: Diagnosis not present

## 2021-10-15 ENCOUNTER — Other Ambulatory Visit: Payer: Self-pay

## 2021-10-15 ENCOUNTER — Ambulatory Visit (HOSPITAL_COMMUNITY)
Admission: RE | Admit: 2021-10-15 | Discharge: 2021-10-15 | Disposition: A | Discharge status: Home / Self Care | Payer: Medicare Other | Source: Ambulatory Visit | Attending: Urology | Admitting: Urology

## 2021-10-15 ENCOUNTER — Encounter: Payer: Self-pay | Admitting: Urology

## 2021-10-15 ENCOUNTER — Ambulatory Visit (INDEPENDENT_AMBULATORY_CARE_PROVIDER_SITE_OTHER): Payer: Medicare Other | Admitting: Urology

## 2021-10-15 VITALS — BP 121/79 | HR 112 | Temp 99.7°F

## 2021-10-15 DIAGNOSIS — R1012 Left upper quadrant pain: Secondary | ICD-10-CM | POA: Diagnosis not present

## 2021-10-15 DIAGNOSIS — N200 Calculus of kidney: Secondary | ICD-10-CM

## 2021-10-15 DIAGNOSIS — K7689 Other specified diseases of liver: Secondary | ICD-10-CM | POA: Diagnosis not present

## 2021-10-15 DIAGNOSIS — N132 Hydronephrosis with renal and ureteral calculous obstruction: Secondary | ICD-10-CM | POA: Diagnosis not present

## 2021-10-15 DIAGNOSIS — N281 Cyst of kidney, acquired: Secondary | ICD-10-CM | POA: Diagnosis not present

## 2021-10-15 DIAGNOSIS — Z90710 Acquired absence of both cervix and uterus: Secondary | ICD-10-CM | POA: Diagnosis not present

## 2021-10-15 DIAGNOSIS — R109 Unspecified abdominal pain: Secondary | ICD-10-CM | POA: Insufficient documentation

## 2021-10-15 HISTORY — DX: Calculus of kidney: N20.0

## 2021-10-15 LAB — MICROSCOPIC EXAMINATION: Renal Epithel, UA: NONE SEEN /hpf

## 2021-10-15 LAB — URINALYSIS, ROUTINE W REFLEX MICROSCOPIC
Bilirubin, UA: NEGATIVE
Glucose, UA: NEGATIVE
Ketones, UA: NEGATIVE
Nitrite, UA: NEGATIVE
Specific Gravity, UA: 1.025 (ref 1.005–1.030)
Urobilinogen, Ur: 0.2 mg/dL (ref 0.2–1.0)
pH, UA: 6.5 (ref 5.0–7.5)

## 2021-10-15 MED ORDER — HYDROCODONE-ACETAMINOPHEN 5-325 MG PO TABS: 1.0000 | ORAL_TABLET | Freq: Four times a day (QID) | ORAL | 0 refills | Status: DC | PRN

## 2021-10-15 MED ORDER — KETOROLAC TROMETHAMINE 30 MG/ML IJ SOLN: 60.0000 mg | Freq: Once | INTRAMUSCULAR | Status: DC

## 2021-10-15 MED ORDER — KETOROLAC TROMETHAMINE 30 MG/ML IJ SOLN
60.0000 mg | Freq: Once | INTRAMUSCULAR | Status: AC
Start: 2021-10-15 — End: 2021-10-15
  Administered 2021-10-15: 60 mg via INTRAMUSCULAR

## 2021-10-15 MED ORDER — TAMSULOSIN HCL 0.4 MG PO CAPS: 0.4000 mg | ORAL_CAPSULE | Freq: Every day | ORAL | 0 refills | Status: DC

## 2021-10-15 NOTE — Progress Notes (Signed)
Urological Symptom Review  Patient is experiencing the following symptoms: Kidney stone    Review of Systems  Gastrointestinal (upper)  : Nausea  Gastrointestinal (lower) : Negative for lower GI symptoms  Constitutional : Negative for symptoms  Skin: Negative for skin symptoms  Eyes: Negative for eye symptoms  Ear/Nose/Throat : Negative for Ear/Nose/Throat symptoms  Hematologic/Lymphatic: Negative for Hematologic/Lymphatic symptoms  Cardiovascular : Negative for cardiovascular symptoms  Respiratory : Negative for respiratory symptoms  Endocrine: Negative for endocrine symptoms  Musculoskeletal: Back pain  Neurological: Headaches  Psychologic: Negative for psychiatric symptoms

## 2021-10-15 NOTE — Progress Notes (Signed)
Assessment: 1. Flank pain   2. Kidney stones   3. Left upper quadrant abdominal pain    Plan: Toradol 60 mg IM given today Continue pain medication prn Strain urine Recommend further evaluation with CT renal stone protocol at [facility] today Will contact her with results and to discuss treatment options  Chief Complaint:  Chief Complaint  Patient presents with   Flank Pain    History of Present Illness:  Kendra Franklin is a 30 y.o. year old female who is seen in consultation from Monico Blitz, MD for evaluation of right flank pain and possible nephrolithiasis.  She presents with a 2-week history of left flank pain.  She has had some associated nausea but no vomiting.  No fevers or chills.  Her pain has been fairly constant.  She currently reports it as a 6/10.  She has a prior history of kidney stones.  She underwent ureteroscopic stone manipulation in [place] approximately 6 years ago.  She was evaluated with a KUB in [place] on 10/11/2021 which showed a 6 mm calcific density over the left flank area suspicious for a possible proximal left ureteral stone, and a large amount of stool throughout the colon.  No other imaging was performed.  No dysuria or gross hematuria.  She is taking hydrocodone for management of her pain.   Past Medical History:  Past Medical History:  Diagnosis Date   Allergy    medicine   Bloody diarrhea 03/11/2015   Crohn's    DX   DEC 2011--- ASCA 100 pANCA NEG-APR 2012 6-TGN 233(LLN 230)  6-MMPN 1173 ON 75 MG IMURAN   Crohn's disease, small and large intestine ([place])    History of anal fissures    History of anal lesion    2009   NON--HEALING   History of kidney stones    History of small intestine ulcer    2011   Osteoporosis     Past Surgical History:  Past Surgical History:  Procedure Laterality Date   COLONOSCOPY  DEC 2011   ILEO-COLONIC ULCERS   COLONOSCOPY N/A 03/16/2015   SLF: 1/ The examined terminal ileum appeared to be normal 2.  The left colon is redundatnt 3. Rectal bleeding due to small internal hemorrhoids   COLONOSCOPY N/A 12/26/2017   Procedure: COLONOSCOPY;  Surgeon: Danie Binder, MD;  Examined ileum was normal, significant looping of the colon, diffuse mild inflammation in the rectum consistent with Crohn's disease s/p biopsy and random colon biopsies.  Pathology was benign.   CYSTOSCOPY WITH RETROGRADE PYELOGRAM, URETEROSCOPY AND STENT PLACEMENT Left 02/27/2014   Procedure: CYSTOSCOPY WITH RETROGRADE PYELOGRAM, URETEROSCOPY/ STENT PLACEMENT;  Surgeon: Alexis Frock, MD;  Location: [facility];  Service: Urology;  Laterality: Left;   ESOPHAGOGASTRODUODENOSCOPY N/A 03/16/2015   SLF: 1. epigastric pain most likely due to gastritis/pyschosocial stressors, less likely GERD. 2. Mild non-erosive gastritis.    ESOPHAGOGASTRODUODENOSCOPY N/A 12/26/2017   Procedure: ESOPHAGOGASTRODUODENOSCOPY (EGD);  Surgeon: Danie Binder, MD;  Normal examined esophagus, mild gastritis s/p biopsy, normal examined duodenum.  Gastric biopsy with mild chronic gastritis, duodenal biopsy benign.   FLEXIBLE SIGMOIDOSCOPY N/A 12/11/2013   Procedure: FLEXIBLE SIGMOIDOSCOPY;  Surgeon: Danie Binder, MD;  [facility];  Service: Endoscopy;  Laterality: N/A;  1:45 PM   GIVENS CAPSULE STUDY  DEC 2011   SB ULCERS   GIVENS CAPSULE STUDY N/A 01/08/2018   Procedure: GIVENS CAPSULE STUDY;  Surgeon: Danie Binder, MD;  Occasional erosion in  the gastric mucosa, small bowel with no ulcers, masses, or AVMs.   HOLMIUM LASER APPLICATION Left 03/01/9200   Procedure: HOLMIUM LASER APPLICATION;  Surgeon: Alexis Frock, MD;  Location: [facility];  Service: Urology;  Laterality: Left;   INCISION AND DRAINAGE PERIRECTAL ABSCESS  2009  &  2010    Allergies:  Allergies  Allergen Reactions   Morphine Hives and Rash   Other Rash    [place] causes bruising and rash    Family History:  Family History  Problem Relation Age  of Onset   Diabetes Maternal Grandmother    Colon cancer Neg Hx    Colon polyps Neg Hx    Inflammatory bowel disease Neg Hx     Social History:  Social History   Tobacco Use   Smoking status: Never   Smokeless tobacco: Never  Vaping Use   Vaping Use: Never used  Substance Use Topics   Alcohol use: Not Currently    Comment: RARE   Drug use: No    Review of symptoms:  Constitutional:  Negative for unexplained weight loss, night sweats, fever, chills ENT:  Negative for nose bleeds, sinus pain, painful swallowing CV:  Negative for chest pain, shortness of breath, exercise intolerance, palpitations, loss of consciousness Resp:  Negative for cough, wheezing, shortness of breath GI:  Negative for vomiting, diarrhea, bloody stools GU:  Positives noted in HPI; otherwise negative for gross hematuria, dysuria, urinary incontinence Neuro:  Negative for seizures, poor balance, limb weakness, slurred speech Psych:  Negative for lack of energy, depression, anxiety Endocrine:  Negative for polydipsia, polyuria, symptoms of hypoglycemia (dizziness, hunger, sweating) Hematologic:  Negative for anemia, purpura, petechia, prolonged or excessive bleeding, use of anticoagulants  Allergic:  Negative for difficulty breathing or choking as a result of exposure to anything; no shellfish allergy; no allergic response (rash/itch) to materials, foods  Physical exam: BP 121/79   Pulse (!) 112   Temp 99.7 F (37.6 C)  GENERAL APPEARANCE:  Well appearing, well developed, well nourished, NAD HEENT: Atraumatic, Normocephalic, oropharynx clear. NECK: Supple without lymphadenopathy or thyromegaly. LUNGS: Clear to auscultation bilaterally. HEART: Regular Rate and Rhythm without murmurs, gallops, or rubs. ABDOMEN: Soft, non-tender, No Masses. EXTREMITIES: Moves all extremities well.  Without clubbing, cyanosis, or edema. NEUROLOGIC:  Alert and oriented x 3, normal gait, CN II-XII grossly intact.  MENTAL  STATUS:  Appropriate. BACK:  Non-tender to palpation.  No CVAT SKIN:  Warm, dry and intact.    Results: U/A:  11-30 WBC, 0-2 RBC, few bacteria

## 2021-10-18 ENCOUNTER — Encounter (HOSPITAL_COMMUNITY): Payer: Self-pay

## 2021-10-18 ENCOUNTER — Telehealth: Payer: Self-pay | Admitting: Urology

## 2021-10-18 ENCOUNTER — Other Ambulatory Visit: Payer: Self-pay

## 2021-10-18 ENCOUNTER — Encounter (HOSPITAL_COMMUNITY)
Admission: RE | Admit: 2021-10-18 | Discharge: 2021-10-18 | Disposition: A | Discharge status: Home / Self Care | Payer: Medicare Other | Source: Ambulatory Visit | Attending: Urology | Admitting: Urology

## 2021-10-18 VITALS — Ht 64.0 in | Wt 122.0 lb

## 2021-10-18 DIAGNOSIS — R109 Unspecified abdominal pain: Secondary | ICD-10-CM

## 2021-10-18 DIAGNOSIS — N200 Calculus of kidney: Secondary | ICD-10-CM

## 2021-10-18 DIAGNOSIS — Z01818 Encounter for other preprocedural examination: Secondary | ICD-10-CM

## 2021-10-18 NOTE — Telephone Encounter (Signed)
I reviewed the CT results from 10/15/21 showing a 6 mm left proximal ureteral calculus with obstruction as noted below.   CT ABDOMEN AND PELVIS WITHOUT CONTRAST   TECHNIQUE: Multidetector CT imaging of the abdomen and pelvis was performed following the standard protocol without IV contrast.   COMPARISON:  01/22/2020 from [facility]   FINDINGS: Lower chest: No acute findings.   Hepatobiliary: A few tiny fluid attenuation hepatic cysts remain stable. No other liver lesions visualized on this noncontrast exam. Gallbladder is unremarkable. No evidence of biliary ductal dilatation.   Pancreas: No mass or inflammatory process visualized on this unenhanced exam.   Spleen:  Within normal limits in size.   Adrenals/Urinary tract: A few punctate nonobstructing calculi are again seen in the right kidney. Small fluid attenuation cyst is again seen in upper pole the left kidney. Mild left hydronephrosis is now seen due to a 6 mm calculus at the left UPJ. Urinary bladder is empty.   Stomach/Bowel: No evidence of obstruction, inflammatory process, or abnormal fluid collections.   Vascular/Lymphatic: No pathologically enlarged lymph nodes identified. No evidence of abdominal aortic aneurysm.   Reproductive: Prior hysterectomy noted. Adnexal regions are unremarkable in appearance.   Other:  None.   Musculoskeletal:  No suspicious bone lesions identified.   IMPRESSION: Mild left hydronephrosis due to 6 mm calculus at the left UPJ.   Tiny nonobstructing right renal calculi.     Electronically Signed   By: Marlaine Hind M.D.   On: 10/15/2021 16:50  Options for management of the proximal left ureteral calculus were discussed with the patient by phone including continued attempts at spontaneous passage, shock wave lithotripsy, and ureteroscopic laser lithotripsy.  Risks and benefits of each option reviewed.  She would like to proceed with LEFT ESL tomorrow.  Potential  risks of ESL discussed including arrhythmia, kidney contusion, need for transfusion, failure to adequately fragment stone, need for additional procedures, obstructing stone fragments, infection, risks of anesthesia/conscious sedation.  She understands and wishes to proceed.  Procedure: The patient will be scheduled for LEFT ESL at [facility].  Surgical request is placed with the surgery schedulers and will be scheduled at the patient's/family request. Informed consent is given as documented below. Anesthesia:  local  The patient does not have sleep apnea, history of MRSA, history of VRE, history of cardiac device requiring special anesthetic needs. Patient is stable and considered clear for surgical in an outpatient ambulatory surgery setting as well as patient hospital setting.  Consent for Operation or Procedure: Provider Certification I hereby certify that the nature, purpose, benefits, usual and most frequent risks of, and alternatives to, the operation or procedure have been explained to the patient (or person authorized to sign for the patient) either by me as responsible physician or by the provider who is to perform the operation or procedure. Time spent such that the patient/family has had an opportunity to ask questions, and that those questions have been answered. The patient or the patient's representative has been advised that selected tasks may be performed by assistants to the primary health care provider(s). I believe that the patient (or person authorized to sign for the patient) understands what has been explained, and has consented to the operation or procedure. No guarantees were implied or made.

## 2021-10-18 NOTE — Telephone Encounter (Signed)
Patient spoke with Dr. Felipa Eth today and wishes to proceed with ESWL.  Patient came to office and lithotripsy instructions reviewed with patient and case posted.

## 2021-10-19 ENCOUNTER — Ambulatory Visit: Payer: Medicare Other | Admitting: Gastroenterology

## 2021-10-19 ENCOUNTER — Encounter (HOSPITAL_COMMUNITY)
Admission: RE | Disposition: A | Discharge status: Home / Self Care | Payer: Self-pay | Source: Home / Self Care | Attending: Urology

## 2021-10-19 ENCOUNTER — Encounter (HOSPITAL_COMMUNITY): Payer: Self-pay | Admitting: Urology

## 2021-10-19 ENCOUNTER — Telehealth: Payer: Self-pay

## 2021-10-19 ENCOUNTER — Ambulatory Visit (HOSPITAL_COMMUNITY)
Admission: RE | Admit: 2021-10-19 | Discharge: 2021-10-19 | Disposition: A | Discharge status: Home / Self Care | Payer: Medicare Other | Attending: Urology | Admitting: Urology

## 2021-10-19 ENCOUNTER — Ambulatory Visit (HOSPITAL_COMMUNITY): Payer: Medicare Other

## 2021-10-19 DIAGNOSIS — N200 Calculus of kidney: Secondary | ICD-10-CM | POA: Diagnosis not present

## 2021-10-19 DIAGNOSIS — K5090 Crohn's disease, unspecified, without complications: Secondary | ICD-10-CM | POA: Diagnosis not present

## 2021-10-19 DIAGNOSIS — N201 Calculus of ureter: Secondary | ICD-10-CM | POA: Diagnosis not present

## 2021-10-19 DIAGNOSIS — Z01818 Encounter for other preprocedural examination: Secondary | ICD-10-CM

## 2021-10-19 HISTORY — PX: EXTRACORPOREAL SHOCK WAVE LITHOTRIPSY: SHX1557

## 2021-10-19 LAB — PREGNANCY, URINE: Preg Test, Ur: NEGATIVE

## 2021-10-19 SURGERY — LITHOTRIPSY, ESWL
Anesthesia: LOCAL | Laterality: Left

## 2021-10-19 MED ORDER — DIPHENHYDRAMINE HCL 25 MG PO CAPS
25.0000 mg | ORAL_CAPSULE | ORAL | Status: AC
  Administered 2021-10-19: 25 mg via ORAL
  Filled 2021-10-19: qty 1

## 2021-10-19 MED ORDER — KETOROLAC TROMETHAMINE 30 MG/ML IJ SOLN
30.0000 mg | Freq: Once | INTRAMUSCULAR | Status: AC
  Administered 2021-10-19: 30 mg via INTRAVENOUS
  Filled 2021-10-19: qty 1

## 2021-10-19 MED ORDER — PROMETHAZINE HCL 25 MG/ML IJ SOLN
INTRAMUSCULAR | Status: AC
  Filled 2021-10-19: qty 1

## 2021-10-19 MED ORDER — SODIUM CHLORIDE 0.9 % IV SOLN
12.5000 mg | Freq: Once | INTRAVENOUS | Status: AC
  Administered 2021-10-19: 12.5 mg via INTRAVENOUS

## 2021-10-19 MED ORDER — CIPROFLOXACIN HCL 500 MG PO TABS
500.0000 mg | ORAL_TABLET | Freq: Once | ORAL | Status: AC
  Administered 2021-10-19: 500 mg via ORAL
  Filled 2021-10-19: qty 2
  Filled 2021-10-19: qty 1

## 2021-10-19 MED ORDER — SODIUM CHLORIDE 0.9 % IV SOLN
Freq: Once | INTRAVENOUS | Status: AC
  Administered 2021-10-19: 1000 mL via INTRAVENOUS

## 2021-10-19 NOTE — Telephone Encounter (Signed)
Patient called yesterday stating she wanted to proceed with both lithotripsy and ureteroscopy. Patient states she has more stones and wish to have them taken out.

## 2021-10-19 NOTE — H&P (Signed)
Assessment: 1. Pre-op testing   2. Ureteral calculus    Plan: Toradol 60 mg IM given today Continue pain medication prn Strain urine Recommend further evaluation with CT renal stone protocol at [facility] today Will contact her with results and to discuss treatment options  10/18/21 Addendum: I reviewed the CT results from 10/15/21 showing a 6 mm left proximal ureteral calculus with obstruction as noted below.     CT ABDOMEN AND PELVIS WITHOUT CONTRAST   TECHNIQUE: Multidetector CT imaging of the abdomen and pelvis was performed following the standard protocol without IV contrast.   COMPARISON:  01/22/2020 from [facility]   FINDINGS: Lower chest: No acute findings.   Hepatobiliary: A few tiny fluid attenuation hepatic cysts remain stable. No other liver lesions visualized on this noncontrast exam. Gallbladder is unremarkable. No evidence of biliary ductal dilatation.   Pancreas: No mass or inflammatory process visualized on this unenhanced exam.   Spleen:  Within normal limits in size.   Adrenals/Urinary tract: A few punctate nonobstructing calculi are again seen in the right kidney. Small fluid attenuation cyst is again seen in upper pole the left kidney. Mild left hydronephrosis is now seen due to a 6 mm calculus at the left UPJ. Urinary bladder is empty.   Stomach/Bowel: No evidence of obstruction, inflammatory process, or abnormal fluid collections.   Vascular/Lymphatic: No pathologically enlarged lymph nodes identified. No evidence of abdominal aortic aneurysm.   Reproductive: Prior hysterectomy noted. Adnexal regions are unremarkable in appearance.   Other:  None.   Musculoskeletal:  No suspicious bone lesions identified.   IMPRESSION: Mild left hydronephrosis due to 6 mm calculus at the left UPJ.   Tiny nonobstructing right renal calculi.     Electronically Signed   By: Marlaine Hind M.D.   On: 10/15/2021 16:50   Options  for management of the proximal left ureteral calculus were discussed with the patient by phone including continued attempts at spontaneous passage, shock wave lithotripsy, and ureteroscopic laser lithotripsy.  Risks and benefits of each option reviewed.  She would like to proceed with LEFT ESL tomorrow.  Potential risks of ESL discussed including arrhythmia, kidney contusion, need for transfusion, failure to adequately fragment stone, need for additional procedures, obstructing stone fragments, infection, risks of anesthesia/conscious sedation.  She understands and wishes to proceed.   Procedure: The patient will be scheduled for LEFT ESL at [facility].  Surgical request is placed with the surgery schedulers and will be scheduled at the patient's/family request. Informed consent is given as documented below. Anesthesia:  local   The patient does not have sleep apnea, history of MRSA, history of VRE, history of cardiac device requiring special anesthetic needs. Patient is stable and considered clear for surgical in an outpatient ambulatory surgery setting as well as patient hospital setting.   Consent for Operation or Procedure: Provider Certification I hereby certify that the nature, purpose, benefits, usual and most frequent risks of, and alternatives to, the operation or procedure have been explained to the patient (or person authorized to sign for the patient) either by me as responsible physician or by the provider who is to perform the operation or procedure. Time spent such that the patient/family has had an opportunity to ask questions, and that those questions have been answered. The patient or the patient's representative has been advised that selected tasks may be performed by assistants to the primary health care provider(s). I believe that the patient (or person authorized to sign for  the patient) understands what has been explained, and has consented to the operation or procedure. No  guarantees were implied or made.  Chief Complaint:  Left ureteral calculus   History of Present Illness:  Kendra Franklin is a 30 y.o. year old female who is seen in consultation from Monico Blitz, MD for evaluation of right flank pain and possible nephrolithiasis.  She presents with a 2-week history of left flank pain.  She has had some associated nausea but no vomiting.  No fevers or chills.  Her pain has been fairly constant.  She currently reports it as a 6/10.  She has a prior history of kidney stones.  She underwent ureteroscopic stone manipulation in [place] approximately 6 years ago.  She was evaluated with a KUB in [place] on 10/11/2021 which showed a 6 mm calcific density over the left flank area suspicious for a possible proximal left ureteral stone, and a large amount of stool throughout the colon.  No other imaging was performed.  No dysuria or gross hematuria.  She is taking hydrocodone for management of her pain.   Past Medical History:  Past Medical History:  Diagnosis Date   Allergy    medicine   Bloody diarrhea 03/11/2015   Crohn's    DX   DEC 2011--- ASCA 100 pANCA NEG-APR 2012 6-TGN 233(LLN 230)  6-MMPN 1173 ON 75 MG IMURAN   Crohn's disease ([place]) 2011   Crohn's disease, small and large intestine ([place])    History of anal fissures    History of anal lesion    2009   NON--HEALING   History of kidney stones    History of small intestine ulcer    2011   Kidney stones 10/15/2021   Osteoporosis     Past Surgical History:  Past Surgical History:  Procedure Laterality Date   COLONOSCOPY  DEC 2011   ILEO-COLONIC ULCERS   COLONOSCOPY N/A 03/16/2015   SLF: 1/ The examined terminal ileum appeared to be normal 2. The left colon is redundatnt 3. Rectal bleeding due to small internal hemorrhoids   COLONOSCOPY N/A 12/26/2017   Procedure: COLONOSCOPY;  Surgeon: Danie Binder, MD;  Examined ileum was normal, significant looping of the colon, diffuse mild inflammation in the  rectum consistent with Crohn's disease s/p biopsy and random colon biopsies.  Pathology was benign.   CYSTOSCOPY WITH RETROGRADE PYELOGRAM, URETEROSCOPY AND STENT PLACEMENT Left 02/27/2014   Procedure: CYSTOSCOPY WITH RETROGRADE PYELOGRAM, URETEROSCOPY/ STENT PLACEMENT;  Surgeon: Alexis Frock, MD;  Location: [facility];  Service: Urology;  Laterality: Left;   ESOPHAGOGASTRODUODENOSCOPY N/A 03/16/2015   SLF: 1. epigastric pain most likely due to gastritis/pyschosocial stressors, less likely GERD. 2. Mild non-erosive gastritis.    ESOPHAGOGASTRODUODENOSCOPY N/A 12/26/2017   Procedure: ESOPHAGOGASTRODUODENOSCOPY (EGD);  Surgeon: Danie Binder, MD;  Normal examined esophagus, mild gastritis s/p biopsy, normal examined duodenum.  Gastric biopsy with mild chronic gastritis, duodenal biopsy benign.   FLEXIBLE SIGMOIDOSCOPY N/A 12/11/2013   Procedure: FLEXIBLE SIGMOIDOSCOPY;  Surgeon: Danie Binder, MD;  [facility];  Service: Endoscopy;  Laterality: N/A;  1:45 PM   GIVENS CAPSULE STUDY  DEC 2011   SB ULCERS   GIVENS CAPSULE STUDY N/A 01/08/2018   Procedure: GIVENS CAPSULE STUDY;  Surgeon: Danie Binder, MD;  Occasional erosion in the gastric mucosa, small bowel with no ulcers, masses, or AVMs.   HOLMIUM LASER APPLICATION Left 01/02/538   Procedure: HOLMIUM LASER APPLICATION;  Surgeon: Alexis Frock, MD;  Location:  [facility];  Service: Urology;  Laterality: Left;   INCISION AND DRAINAGE PERIRECTAL ABSCESS  2009  &  2010    Allergies:  Allergies  Allergen Reactions   Morphine Hives and Rash   Other Rash    Coban causes bruising and rash    Family History:  Family History  Problem Relation Age of Onset   Diabetes Maternal Grandmother    Colon cancer Neg Hx    Colon polyps Neg Hx    Inflammatory bowel disease Neg Hx     Social History:  Social History   Tobacco Use   Smoking status: Never   Smokeless tobacco: Never  Vaping Use   Vaping Use:  Never used  Substance Use Topics   Alcohol use: Not Currently    Comment: RARE   Drug use: No    Review of symptoms:  Constitutional:  Negative for unexplained weight loss, night sweats, fever, chills ENT:  Negative for nose bleeds, sinus pain, painful swallowing CV:  Negative for chest pain, shortness of breath, exercise intolerance, palpitations, loss of consciousness Resp:  Negative for cough, wheezing, shortness of breath GI:  Negative for vomiting, diarrhea, bloody stools GU:  Positives noted in HPI; otherwise negative for gross hematuria, dysuria, urinary incontinence Neuro:  Negative for seizures, poor balance, limb weakness, slurred speech Psych:  Negative for lack of energy, depression, anxiety Endocrine:  Negative for polydipsia, polyuria, symptoms of hypoglycemia (dizziness, hunger, sweating) Hematologic:  Negative for anemia, purpura, petechia, prolonged or excessive bleeding, use of anticoagulants  Allergic:  Negative for difficulty breathing or choking as a result of exposure to anything; no shellfish allergy; no allergic response (rash/itch) to materials, foods  Physical exam: BP 123/82   Pulse 91   Temp 98.6 F (37 C) (Oral)   Resp 18   LMP  (LMP Unknown)   SpO2 98%  GENERAL APPEARANCE:  Well appearing, well developed, well nourished, NAD HEENT: Atraumatic, Normocephalic, oropharynx clear. NECK: Supple without lymphadenopathy or thyromegaly. LUNGS: Clear to auscultation bilaterally. HEART: Regular Rate and Rhythm without murmurs, gallops, or rubs. ABDOMEN: Soft, non-tender, No Masses. EXTREMITIES: Moves all extremities well.  Without clubbing, cyanosis, or edema. NEUROLOGIC:  Alert and oriented x 3, normal gait, CN II-XII grossly intact.  MENTAL STATUS:  Appropriate. BACK:  Non-tender to palpation.  No CVAT SKIN:  Warm, dry and intact.    Results: U/A:  11-30 WBC, 0-2 RBC, few bacteria

## 2021-10-19 NOTE — Interval H&P Note (Signed)
History and Physical Interval Note:  10/19/2021 7:41 AM  Kendra Franklin  has presented today for surgery, with the diagnosis of left ureteral calculus.  The various methods of treatment have been discussed with the patient and family. After consideration of risks, benefits and other options for treatment, the patient has consented to  Procedure(s): EXTRACORPOREAL SHOCK WAVE LITHOTRIPSY (ESWL) (Left) as a surgical intervention.  The patient's history has been reviewed, patient examined, no change in status, stable for surgery.  I have reviewed the patient's chart and labs.  Questions were answered to the patient's satisfaction.     [name]

## 2021-10-20 ENCOUNTER — Encounter (HOSPITAL_COMMUNITY): Payer: Self-pay | Admitting: Urology

## 2021-10-26 ENCOUNTER — Ambulatory Visit (HOSPITAL_COMMUNITY)
Admission: RE | Admit: 2021-10-26 | Discharge: 2021-10-26 | Disposition: A | Discharge status: Home / Self Care | Payer: Medicare Other | Source: Ambulatory Visit | Attending: Urology | Admitting: Urology

## 2021-10-26 ENCOUNTER — Ambulatory Visit (INDEPENDENT_AMBULATORY_CARE_PROVIDER_SITE_OTHER): Payer: Medicare Other | Admitting: Urology

## 2021-10-26 ENCOUNTER — Other Ambulatory Visit: Payer: Self-pay

## 2021-10-26 ENCOUNTER — Encounter: Payer: Self-pay | Admitting: Urology

## 2021-10-26 ENCOUNTER — Ambulatory Visit (HOSPITAL_COMMUNITY): Payer: Medicare Other

## 2021-10-26 VITALS — BP 117/74 | HR 94 | Wt 122.0 lb

## 2021-10-26 DIAGNOSIS — R109 Unspecified abdominal pain: Secondary | ICD-10-CM | POA: Diagnosis not present

## 2021-10-26 DIAGNOSIS — R8290 Unspecified abnormal findings in urine: Secondary | ICD-10-CM | POA: Diagnosis not present

## 2021-10-26 DIAGNOSIS — N201 Calculus of ureter: Secondary | ICD-10-CM | POA: Insufficient documentation

## 2021-10-26 LAB — MICROSCOPIC EXAMINATION: Renal Epithel, UA: NONE SEEN /hpf

## 2021-10-26 LAB — URINALYSIS, ROUTINE W REFLEX MICROSCOPIC
Bilirubin, UA: NEGATIVE
Glucose, UA: NEGATIVE
Leukocytes,UA: NEGATIVE
Nitrite, UA: NEGATIVE
Protein,UA: NEGATIVE
Specific Gravity, UA: 1.025 (ref 1.005–1.030)
Urobilinogen, Ur: 0.2 mg/dL (ref 0.2–1.0)
pH, UA: 6 (ref 5.0–7.5)

## 2021-10-26 NOTE — Progress Notes (Signed)
Assessment: 1. Ureteral calculus, left     Plan: KUB today Stone analysis sent Urine culture sent today - will call with results Stone prevention discussed and information provided Return to office in 1 month with KUB and renal U/S prior to visit  Chief Complaint:  Chief Complaint  Patient presents with   ureteral calculus     History of Present Illness:  Kendra Franklin is a 30 y.o. year old female who is seen for evaluation of left ureteral calculus.  She presented with a 2-week history of left flank pain.  She has had some associated nausea but no vomiting.  No fevers or chills.  Her pain has been fairly constant. She has a prior history of kidney stones.  She underwent ureteroscopic stone manipulation in [place] approximately 6 years ago.  She was evaluated with a KUB in [place] on 10/11/2021 which showed a 6 mm calcific density over the left flank area suspicious for a possible proximal left ureteral stone, and a large amount of stool throughout the colon.  CT imaging demonstrated a 6 mm calculus at the left UPJ. She underwent left ESL on 10/19/2021.  She returns today for follow-up.  She has done very well since the procedure.  She passed a number of stone fragments the following day.  She has not had any significant flank pain.  No fevers, chills, nausea, or vomiting.  No dysuria or gross hematuria.  Portions of the above documentation were copied from a prior visit for review purposes only.   Past Medical History:  Past Medical History:  Diagnosis Date   Allergy    medicine   Bloody diarrhea 03/11/2015   Crohn's    DX   DEC 2011--- ASCA 100 pANCA NEG-APR 2012 6-TGN 233(LLN 230)  6-MMPN 1173 ON 75 MG IMURAN   Crohn's disease ([place]) 2011   Crohn's disease, small and large intestine ([place])    History of anal fissures    History of anal lesion    2009   NON--HEALING   History of kidney stones    History of small intestine ulcer    2011   Kidney stones 10/15/2021    Osteoporosis     Past Surgical History:  Past Surgical History:  Procedure Laterality Date   COLONOSCOPY  DEC 2011   ILEO-COLONIC ULCERS   COLONOSCOPY N/A 03/16/2015   SLF: 1/ The examined terminal ileum appeared to be normal 2. The left colon is redundatnt 3. Rectal bleeding due to small internal hemorrhoids   COLONOSCOPY N/A 12/26/2017   Procedure: COLONOSCOPY;  Surgeon: Danie Binder, MD;  Examined ileum was normal, significant looping of the colon, diffuse mild inflammation in the rectum consistent with Crohn's disease s/p biopsy and random colon biopsies.  Pathology was benign.   CYSTOSCOPY WITH RETROGRADE PYELOGRAM, URETEROSCOPY AND STENT PLACEMENT Left 02/27/2014   Procedure: CYSTOSCOPY WITH RETROGRADE PYELOGRAM, URETEROSCOPY/ STENT PLACEMENT;  Surgeon: Alexis Frock, MD;  Location: [facility];  Service: Urology;  Laterality: Left;   ESOPHAGOGASTRODUODENOSCOPY N/A 03/16/2015   SLF: 1. epigastric pain most likely due to gastritis/pyschosocial stressors, less likely GERD. 2. Mild non-erosive gastritis.    ESOPHAGOGASTRODUODENOSCOPY N/A 12/26/2017   Procedure: ESOPHAGOGASTRODUODENOSCOPY (EGD);  Surgeon: Danie Binder, MD;  Normal examined esophagus, mild gastritis s/p biopsy, normal examined duodenum.  Gastric biopsy with mild chronic gastritis, duodenal biopsy benign.   EXTRACORPOREAL SHOCK WAVE LITHOTRIPSY Left 10/19/2021   Procedure: EXTRACORPOREAL SHOCK WAVE LITHOTRIPSY (ESWL);  Surgeon: Primus Bravo., MD;  [facility];  Service: Urology;  Laterality: Left;   FLEXIBLE SIGMOIDOSCOPY N/A 12/11/2013   Procedure: FLEXIBLE SIGMOIDOSCOPY;  Surgeon: Danie Binder, MD;  [facility];  Service: Endoscopy;  Laterality: N/A;  1:45 PM   GIVENS CAPSULE STUDY  DEC 2011   SB ULCERS   GIVENS CAPSULE STUDY N/A 01/08/2018   Procedure: GIVENS CAPSULE STUDY;  Surgeon: Danie Binder, MD;  Occasional erosion in the gastric mucosa, small bowel with no ulcers,  masses, or AVMs.   HOLMIUM LASER APPLICATION Left 01/03/4157   Procedure: HOLMIUM LASER APPLICATION;  Surgeon: Alexis Frock, MD;  Location: [facility];  Service: Urology;  Laterality: Left;   INCISION AND DRAINAGE PERIRECTAL ABSCESS  2009  &  2010    Allergies:  Allergies  Allergen Reactions   Morphine Hives and Rash   Other Rash    Coban causes bruising and rash    Family History:  Family History  Problem Relation Age of Onset   Diabetes Maternal Grandmother    Colon cancer Neg Hx    Colon polyps Neg Hx    Inflammatory bowel disease Neg Hx     Social History:  Social History   Tobacco Use   Smoking status: Never   Smokeless tobacco: Never  Vaping Use   Vaping Use: Never used  Substance Use Topics   Alcohol use: Not Currently    Comment: RARE   Drug use: No    ROS: Constitutional:  Negative for fever, chills, weight loss CV: Negative for chest pain, previous MI, hypertension Respiratory:  Negative for shortness of breath, wheezing, sleep apnea, frequent cough GI:  Negative for nausea, vomiting, bloody stool, GERD  Physical exam: BP 117/74   Pulse 94   Wt 122 lb (55.3 kg)   LMP  (LMP Unknown)   BMI 20.94 kg/m  GENERAL APPEARANCE:  Well appearing, well developed, well nourished, NAD HEENT:  Atraumatic, normocephalic, oropharynx clear NECK:  Supple without lymphadenopathy or thyromegaly ABDOMEN:  Soft, non-tender, no masses EXTREMITIES:  Moves all extremities well, without clubbing, cyanosis, or edema NEUROLOGIC:  Alert and oriented x 3, normal gait, CN II-XII grossly intact MENTAL STATUS:  appropriate BACK:  Non-tender to palpation, No CVAT SKIN:  Warm, dry, and intact   Results: U/A:  0-5 WBC, 0-2 RBC, 0-10 epithelial cells, many bacteria, nitrite negative

## 2021-10-26 NOTE — Progress Notes (Signed)
Urological Symptom Review  Patient is experiencing the following symptoms: Frequent urination Kidney stones  Review of Systems  Gastrointestinal (upper)  : Negative for upper GI symptoms  Gastrointestinal (lower) : Negative for lower GI symptoms  Constitutional : Negative for symptoms  Skin: Negative for skin symptoms  Eyes: Negative for eye symptoms  Ear/Nose/Throat : Negative for Ear/Nose/Throat symptoms  Hematologic/Lymphatic: Negative for Hematologic/Lymphatic symptoms  Cardiovascular : Negative for cardiovascular symptoms  Respiratory : Negative for respiratory symptoms  Endocrine: Negative for endocrine symptoms  Musculoskeletal: Negative for musculoskeletal symptoms  Neurological: Negative for neurological symptoms  Psychologic: Negative for psychiatric symptoms

## 2021-10-28 LAB — URINE CULTURE

## 2021-10-30 LAB — CALCULI, WITH PHOTOGRAPH (CLINICAL LAB)
Calcium Oxalate Dihydrate: 50 %
Calcium Oxalate Monohydrate: 50 %
Weight Calculi: 33 mg

## 2021-11-01 ENCOUNTER — Ambulatory Visit (HOSPITAL_COMMUNITY)
Admission: RE | Admit: 2021-11-01 | Discharge: 2021-11-01 | Disposition: A | Discharge status: Home / Self Care | Payer: Medicare Other | Source: Ambulatory Visit | Attending: Urology | Admitting: Urology

## 2021-11-01 ENCOUNTER — Other Ambulatory Visit: Payer: Self-pay

## 2021-11-01 DIAGNOSIS — N281 Cyst of kidney, acquired: Secondary | ICD-10-CM | POA: Diagnosis not present

## 2021-11-01 DIAGNOSIS — N201 Calculus of ureter: Secondary | ICD-10-CM | POA: Diagnosis not present

## 2021-11-01 DIAGNOSIS — N200 Calculus of kidney: Secondary | ICD-10-CM | POA: Diagnosis not present

## 2021-11-25 ENCOUNTER — Telehealth: Payer: Self-pay

## 2021-11-25 ENCOUNTER — Ambulatory Visit: Payer: Medicare Other | Admitting: Urology

## 2021-11-25 NOTE — Progress Notes (Deleted)
Assessment: 1. Ureteral calculus, left     Plan: Stone prevention discussed and information provided Return to office in 1 month with KUB and renal U/S prior to visit  Chief Complaint:  No chief complaint on file.   History of Present Illness:  Kendra Franklin is a 30 y.o. year old female who is seen for further evaluation of left ureteral calculus.  She presented with a 2-week history of left flank pain with some associated nausea but no vomiting.  No fevers or chills.  She has a prior history of kidney stones.  She underwent ureteroscopic stone manipulation in [place] approximately 6 years ago.  She was evaluated with a KUB in [place] on 10/11/2021 which showed a 6 mm calcific density over the left flank area suspicious for a possible proximal left ureteral stone, and a large amount of stool throughout the colon.  CT imaging demonstrated a 6 mm calculus at the left UPJ. She underwent left ESL on 10/19/2021. She did well following the procedure, passing a number of stone fragments the following day.   KUB from 10/26/2021 did not show any obvious calcifications. Renal ultrasound from 11/02/2021 showed no evidence of hydronephrosis, a 2 cm left renal cyst. Stone analysis: 50% calcium oxalate monohydrate, 50% calcium oxalate dihydrate.  She has not had any significant flank pain.  No fevers, chills, nausea, or vomiting.  No dysuria or gross hematuria.  Portions of the above documentation were copied from a prior visit for review purposes only.   Past Medical History:  Past Medical History:  Diagnosis Date   Allergy    medicine   Bloody diarrhea 03/11/2015   Crohn's    DX   DEC 2011--- ASCA 100 pANCA NEG-APR 2012 6-TGN 233(LLN 230)  6-MMPN 1173 ON 75 MG IMURAN   Crohn's disease ([place]) 2011   Crohn's disease, small and large intestine ([place])    History of anal fissures    History of anal lesion    2009   NON--HEALING   History of kidney stones    History of small intestine  ulcer    2011   Kidney stones 10/15/2021   Osteoporosis     Past Surgical History:  Past Surgical History:  Procedure Laterality Date   COLONOSCOPY  DEC 2011   ILEO-COLONIC ULCERS   COLONOSCOPY N/A 03/16/2015   SLF: 1/ The examined terminal ileum appeared to be normal 2. The left colon is redundatnt 3. Rectal bleeding due to small internal hemorrhoids   COLONOSCOPY N/A 12/26/2017   Procedure: COLONOSCOPY;  Surgeon: Danie Binder, MD;  Examined ileum was normal, significant looping of the colon, diffuse mild inflammation in the rectum consistent with Crohn's disease s/p biopsy and random colon biopsies.  Pathology was benign.   CYSTOSCOPY WITH RETROGRADE PYELOGRAM, URETEROSCOPY AND STENT PLACEMENT Left 02/27/2014   Procedure: CYSTOSCOPY WITH RETROGRADE PYELOGRAM, URETEROSCOPY/ STENT PLACEMENT;  Surgeon: Alexis Frock, MD;  Location[facility];  Service: Urology;  Laterality: Left;   ESOPHAGOGASTRODUODENOSCOPY N/A 03/16/2015   SLF: 1. epigastric pain most likely due to gastritis/pyschosocial stressors, less likely GERD. 2. Mild non-erosive gastritis.    ESOPHAGOGASTRODUODENOSCOPY N/A 12/26/2017   Procedure: ESOPHAGOGASTRODUODENOSCOPY (EGD);  Surgeon: Danie Binder, MD;  Normal examined esophagus, mild gastritis s/p biopsy, normal examined duodenum.  Gastric biopsy with mild chronic gastritis, duodenal biopsy benign.   EXTRACORPOREAL SHOCK WAVE LITHOTRIPSY Left 10/19/2021   Procedure: EXTRACORPOREAL SHOCK WAVE LITHOTRIPSY (ESWL);  Surgeon: Primus Bravo., MD;  [facility];  Service: Urology;  Laterality: Left;   FLEXIBLE SIGMOIDOSCOPY N/A 12/11/2013   Procedure: FLEXIBLE SIGMOIDOSCOPY;  Surgeon: Danie Binder, MD;  [facility];  Service: Endoscopy;  Laterality: N/A;  1:45 PM   GIVENS CAPSULE STUDY  DEC 2011   SB ULCERS   GIVENS CAPSULE STUDY N/A 01/08/2018   Procedure: GIVENS CAPSULE STUDY;  Surgeon: Danie Binder, MD;  Occasional erosion in the  gastric mucosa, small bowel with no ulcers, masses, or AVMs.   HOLMIUM LASER APPLICATION Left 01/04/5169   Procedure: HOLMIUM LASER APPLICATION;  Surgeon: Alexis Frock, MD;  Location: [facility];  Service: Urology;  Laterality: Left;   INCISION AND DRAINAGE PERIRECTAL ABSCESS  2009  &  2010    Allergies:  Allergies  Allergen Reactions   Morphine Hives and Rash   Other Rash    Coban causes bruising and rash    Family History:  Family History  Problem Relation Age of Onset   Diabetes Maternal Grandmother    Colon cancer Neg Hx    Colon polyps Neg Hx    Inflammatory bowel disease Neg Hx     Social History:  Social History   Tobacco Use   Smoking status: Never   Smokeless tobacco: Never  Vaping Use   Vaping Use: Never used  Substance Use Topics   Alcohol use: Not Currently    Comment: RARE   Drug use: No    ROS: Constitutional:  Negative for fever, chills, weight loss CV: Negative for chest pain, previous MI, hypertension Respiratory:  Negative for shortness of breath, wheezing, sleep apnea, frequent cough GI:  Negative for nausea, vomiting, bloody stool, GERD  Physical exam: There were no vitals taken for this visit. ***  Results: ***

## 2021-11-25 NOTE — Telephone Encounter (Signed)
Patient says she has already done her ultra sound.  She says she wants to know if she needing another one?  Please advise.  Call back:   [phone]   Thanks, Helene Kelp

## 2021-11-25 NOTE — Telephone Encounter (Signed)
Patient has ultrasound done 1 week after OV instead of 1 week prior to next appt. Patient will get KUB prior. Patient voiced understanding.

## 2021-12-07 ENCOUNTER — Ambulatory Visit (INDEPENDENT_AMBULATORY_CARE_PROVIDER_SITE_OTHER): Payer: Medicare Other | Admitting: Urology

## 2021-12-07 ENCOUNTER — Encounter: Payer: Self-pay | Admitting: Urology

## 2021-12-07 ENCOUNTER — Other Ambulatory Visit: Payer: Self-pay

## 2021-12-07 ENCOUNTER — Ambulatory Visit (HOSPITAL_COMMUNITY)
Admission: RE | Admit: 2021-12-07 | Discharge: 2021-12-07 | Disposition: A | Discharge status: Home / Self Care | Payer: Medicare Other | Source: Ambulatory Visit | Attending: Urology | Admitting: Urology

## 2021-12-07 VITALS — BP 128/78 | HR 91 | Wt 122.0 lb

## 2021-12-07 DIAGNOSIS — N201 Calculus of ureter: Secondary | ICD-10-CM

## 2021-12-07 DIAGNOSIS — N200 Calculus of kidney: Secondary | ICD-10-CM

## 2021-12-07 LAB — URINALYSIS, ROUTINE W REFLEX MICROSCOPIC
Bilirubin, UA: NEGATIVE
Glucose, UA: NEGATIVE
Ketones, UA: NEGATIVE
Leukocytes,UA: NEGATIVE
Nitrite, UA: NEGATIVE
Protein,UA: NEGATIVE
RBC, UA: NEGATIVE
Specific Gravity, UA: >1.03 — ABNORMAL HIGH (ref 1.005–1.030)
Urobilinogen, Ur: 1 mg/dL (ref 0.2–1.0)
pH, UA: 6 (ref 5.0–7.5)

## 2021-12-07 NOTE — Progress Notes (Signed)
Assessment: 1. Kidney stones   2. Ureteral calculus, left      Plan: I reviewed the patient's recent imaging results including her KUB and ultrasound findings.  Results discussed with the patient. Given her history of recurrent nephrolithiasis, recommend further evaluation with a 24-hour urine. I will contact her with results when available. Continue stone prevention  Return to office in 6 months  Chief Complaint:  Chief Complaint  Patient presents with   Nephrolithiasis    History of Present Illness:  Kendra Franklin is a 31 y.o. year old female who is seen for further evaluation of left ureteral calculus.  She presented with a 2-week history of left flank pain with some associated nausea but no vomiting.  No fevers or chills.  She has a prior history of kidney stones.  She underwent ureteroscopic stone manipulation in [place] approximately 6 years ago.  She was evaluated with a KUB in [place] on 10/11/2021 which showed a 6 mm calcific density over the left flank area suspicious for a possible proximal left ureteral stone, and a large amount of stool throughout the colon.  CT imaging demonstrated a 6 mm calculus at the left UPJ. She underwent left ESL on 10/19/2021. She did well following the procedure, passing a number of stone fragments the following day.   KUB from 10/26/2021 did not show any obvious calcifications. Renal ultrasound from 11/02/2021 showed no evidence of hydronephrosis, a 2 cm left renal cyst. Stone analysis: 50% calcium oxalate monohydrate, 50% calcium oxalate dihydrate. Renal ultrasound from 11/02/2021 showed small right intrarenal stones, no evidence of obstruction or renal mass.  She returns today for follow-up.  No flank pain or urinary symptoms.  She is interested in further evaluation of her recurrent stone disease.  Portions of the above documentation were copied from a prior visit for review purposes only.   Past Medical History:  Past Medical  History:  Diagnosis Date   Allergy    medicine   Bloody diarrhea 03/11/2015   Crohn's    DX   DEC 2011--- ASCA 100 pANCA NEG-APR 2012 6-TGN 233(LLN 230)  6-MMPN 1173 ON 75 MG IMURAN   Crohn's disease ([place]) 2011   Crohn's disease, small and large intestine ([place])    History of anal fissures    History of anal lesion    2009   NON--HEALING   History of kidney stones    History of small intestine ulcer    2011   Kidney stones 10/15/2021   Osteoporosis     Past Surgical History:  Past Surgical History:  Procedure Laterality Date   COLONOSCOPY  DEC 2011   ILEO-COLONIC ULCERS   COLONOSCOPY N/A 03/16/2015   SLF: 1/ The examined terminal ileum appeared to be normal 2. The left colon is redundatnt 3. Rectal bleeding due to small internal hemorrhoids   COLONOSCOPY N/A 12/26/2017   Procedure: COLONOSCOPY;  Surgeon: Danie Binder, MD;  Examined ileum was normal, significant looping of the colon, diffuse mild inflammation in the rectum consistent with Crohn's disease s/p biopsy and random colon biopsies.  Pathology was benign.   CYSTOSCOPY WITH RETROGRADE PYELOGRAM, URETEROSCOPY AND STENT PLACEMENT Left 02/27/2014   Procedure: CYSTOSCOPY WITH RETROGRADE PYELOGRAM, URETEROSCOPY/ STENT PLACEMENT;  Surgeon: Alexis Frock, MD;  Location: [facility];  Service: Urology;  Laterality: Left;   ESOPHAGOGASTRODUODENOSCOPY N/A 03/16/2015   SLF: 1. epigastric pain most likely due to gastritis/pyschosocial stressors, less likely GERD. 2. Mild non-erosive gastritis.    ESOPHAGOGASTRODUODENOSCOPY N/A  12/26/2017   Procedure: ESOPHAGOGASTRODUODENOSCOPY (EGD);  Surgeon: Danie Binder, MD;  Normal examined esophagus, mild gastritis s/p biopsy, normal examined duodenum.  Gastric biopsy with mild chronic gastritis, duodenal biopsy benign.   EXTRACORPOREAL SHOCK WAVE LITHOTRIPSY Left 10/19/2021   Procedure: EXTRACORPOREAL SHOCK WAVE LITHOTRIPSY (ESWL);  Surgeon: Primus Bravo., MD;  [facility];  Service: Urology;  Laterality: Left;   FLEXIBLE SIGMOIDOSCOPY N/A 12/11/2013   Procedure: FLEXIBLE SIGMOIDOSCOPY;  Surgeon: Danie Binder, MD;  [facility];  Service: Endoscopy;  Laterality: N/A;  1:45 PM   GIVENS CAPSULE STUDY  DEC 2011   SB ULCERS   GIVENS CAPSULE STUDY N/A 01/08/2018   Procedure: GIVENS CAPSULE STUDY;  Surgeon: Danie Binder, MD;  Occasional erosion in the gastric mucosa, small bowel with no ulcers, masses, or AVMs.   HOLMIUM LASER APPLICATION Left 03/31/2991   Procedure: HOLMIUM LASER APPLICATION;  Surgeon: Alexis Frock, MD;  Location: [facility];  Service: Urology;  Laterality: Left;   INCISION AND DRAINAGE PERIRECTAL ABSCESS  2009  &  2010    Allergies:  Allergies  Allergen Reactions   Morphine Hives and Rash   Other Rash    Coban causes bruising and rash    Family History:  Family History  Problem Relation Age of Onset   Diabetes Maternal Grandmother    Colon cancer Neg Hx    Colon polyps Neg Hx    Inflammatory bowel disease Neg Hx     Social History:  Social History   Tobacco Use   Smoking status: Never   Smokeless tobacco: Never  Vaping Use   Vaping Use: Never used  Substance Use Topics   Alcohol use: Not Currently    Comment: RARE   Drug use: No    ROS: Constitutional:  Negative for fever, chills, weight loss CV: Negative for chest pain, previous MI, hypertension Respiratory:  Negative for shortness of breath, wheezing, sleep apnea, frequent cough GI:  Negative for nausea, vomiting, bloody stool, GERD  Physical exam: BP 128/78 (BP Location: Right Arm)    Pulse 91    Wt 122 lb (55.3 kg)    BMI 20.94 kg/m  GENERAL APPEARANCE:  Well appearing, well developed, well nourished, NAD HEENT:  Atraumatic, normocephalic, oropharynx clear NECK:  Supple without lymphadenopathy or thyromegaly ABDOMEN:  Soft, non-tender, no masses EXTREMITIES:  Moves all extremities well, without clubbing, cyanosis, or  edema NEUROLOGIC:  Alert and oriented x 3, normal gait, CN II-XII grossly intact MENTAL STATUS:  appropriate BACK:  Non-tender to palpation, No CVAT SKIN:  Warm, dry, and intact   Results: U/A dipstick negative

## 2021-12-07 NOTE — Progress Notes (Signed)

## 2021-12-30 ENCOUNTER — Other Ambulatory Visit: Payer: Self-pay | Admitting: Urology

## 2021-12-31 ENCOUNTER — Encounter: Payer: Self-pay | Admitting: Urology

## 2022-02-23 ENCOUNTER — Telehealth: Payer: Self-pay

## 2022-02-23 ENCOUNTER — Other Ambulatory Visit: Payer: Self-pay

## 2022-02-23 ENCOUNTER — Ambulatory Visit (INDEPENDENT_AMBULATORY_CARE_PROVIDER_SITE_OTHER): Payer: Medicare Other | Admitting: Gastroenterology

## 2022-02-23 DIAGNOSIS — K5090 Crohn's disease, unspecified, without complications: Secondary | ICD-10-CM

## 2022-02-23 MED ORDER — PEG 3350-KCL-NA BICARB-NACL 420 G PO SOLR: 4000.0000 mL | ORAL | 0 refills | Status: AC

## 2022-02-23 NOTE — Patient Instructions (Signed)
We are arranging a colonoscopy in the near future with Dr. Abbey Chatters! ? ?Please complete blood work when you are able.  ? ?I am glad you are doing well! ? ?I enjoyed seeing you again today! As you know, I value our relationship and want to provide genuine, compassionate, and quality care. I welcome your feedback. If you receive a survey regarding your visit,  I greatly appreciate you taking time to fill this out. See you next time! ? ?Annitta Needs, PhD, ANP-BC ?[facility]  ? ?

## 2022-02-23 NOTE — Progress Notes (Signed)
? ? ? ? ? ?Gastroenterology Office Note   ? ? ?Primary Care Physician:  Monico Blitz, MD  ?Primary Gastroenterologist: Dr. Abbey Chatters ? ? ?Chief Complaint  ? ?Chief Complaint  ?Patient presents with  ? Follow-up  ? ? ? ?History of Present Illness  ? ?Kendra Franklin is a 31 y.o. female presenting today in follow-up with a history of ileocolonic Crohn's disease diagnosed in 2011 at age 66, perianal fistula, and osteoporosis.  In 2011 at time of diagnosis, she had multiple small bowel ulcers on capsule study. Colonoscopy with small ulcer at base of cecum in 2011.  Most recent DEXA scan March 2022 consistent with osteoporosis. Last EGD and colonoscopy January 2019 with mild gastritis benign duodenal biopsies, normal colon, slight inflammation in the rectum with negative biopsies throughout.  She underwent capsule endoscopy February 2019 with mild gastritis normal small bowel.  CT enterography May 2020 with no active Crohn's disease. She has seen [facility] in the past, visit 11/12/2019, who felt her symptoms consistent with IBS and visceral hypersensitivity.  She was placed on mirtazapine which she states "knocked her out."  She has also tried Bentyl with no relief in her symptoms. Perimubilical pain has been long-standing, worsened on Imuran. amitryptilline without improvement in abdominal pain. Previously on Remicade but has been off of this for almost a year per her own choice. She had also been on Imuran in the past. ? ?Returns today stating she feels much improved. She is on no maintenance medications per her choice. She is taking some type of supplement from online called Crohn's and colitis pill. Taking Sea moss. Taking calcium and Vit D. Good appetite. No N/V. Abdominal pain at baseline. No rectal bleeding. She would like a colonoscopy.  ? ? ? ? ? ? ?Past Medical History:  ?Diagnosis Date  ? Allergy   ? medicine  ? Bloody diarrhea 03/11/2015  ? Crohn's   ? DX   DEC 2011--- ASCA 100 pANCA NEG-APR 2012 6-TGN 233(LLN  230)  6-MMPN 1173 ON 75 MG IMURAN  ? Crohn's disease ([place]) 2011  ? Crohn's disease, small and large intestine ([place])   ? History of anal fissures   ? History of anal lesion   ? 2009   NON--HEALING  ? History of kidney stones   ? History of small intestine ulcer   ? 2011  ? Kidney stones 10/15/2021  ? Osteoporosis   ? ? ?Past Surgical History:  ?Procedure Laterality Date  ? COLONOSCOPY  DEC 2011  ? ILEO-COLONIC ULCERS  ? COLONOSCOPY N/A 03/16/2015  ? SLF: 1/ The examined terminal ileum appeared to be normal 2. The left colon is redundatnt 3. Rectal bleeding due to small internal hemorrhoids  ? COLONOSCOPY N/A 12/26/2017  ? Procedure: COLONOSCOPY;  Surgeon: Danie Binder, MD;  Examined ileum was normal, significant looping of the colon, diffuse mild inflammation in the rectum consistent with Crohn's disease s/p biopsy and random colon biopsies.  Pathology was benign.  ? CYSTOSCOPY WITH RETROGRADE PYELOGRAM, URETEROSCOPY AND STENT PLACEMENT Left 02/27/2014  ? Procedure: CYSTOSCOPY WITH RETROGRADE PYELOGRAM, URETEROSCOPY/ STENT PLACEMENT;  Surgeon: Alexis Frock, MD;  Location: [facility];  Service: Urology;  Laterality: Left;  ? ESOPHAGOGASTRODUODENOSCOPY N/A 03/16/2015  ? SLF: 1. epigastric pain most likely due to gastritis/pyschosocial stressors, less likely GERD. 2. Mild non-erosive gastritis.   ? ESOPHAGOGASTRODUODENOSCOPY N/A 12/26/2017  ? Procedure: ESOPHAGOGASTRODUODENOSCOPY (EGD);  Surgeon: Danie Binder, MD;  Normal examined esophagus, mild gastritis s/p biopsy, normal examined  duodenum.  Gastric biopsy with mild chronic gastritis, duodenal biopsy benign.  ? EXTRACORPOREAL SHOCK WAVE LITHOTRIPSY Left 10/19/2021  ? Procedure: EXTRACORPOREAL SHOCK WAVE LITHOTRIPSY (ESWL);  Surgeon: Primus Bravo., MD;  [facility]: [facility];  Service: Urology;  Laterality: Left;  ? FLEXIBLE SIGMOIDOSCOPY N/A 12/11/2013  ? Procedure: FLEXIBLE SIGMOIDOSCOPY;  Surgeon: Danie Binder, MD;  [facility];   Service: Endoscopy;  Laterality: N/A;  1:45 PM  ? [place] 2011  ? SB ULCERS  ? GIVENS CAPSULE STUDY N/A 01/08/2018  ? Procedure: GIVENS CAPSULE STUDY;  Surgeon: Danie Binder, MD;  Occasional erosion in the gastric mucosa, small bowel with no ulcers, masses, or AVMs.  ? HOLMIUM LASER APPLICATION Left 0/07/3234  ? Procedure: HOLMIUM LASER APPLICATION;  Surgeon: Alexis Frock, MD;  Location: [facility];  Service: Urology;  Laterality: Left;  ? INCISION AND DRAINAGE PERIRECTAL ABSCESS  2009  &  2010  ? ? ?Current Outpatient Medications  ?Medication Sig Dispense Refill  ? medroxyPROGESTERone (DEPO-PROVERA) 150 MG/ML injection Inject 150 mg into the muscle every 3 (three) months. Due in October 2022    ? polyethylene glycol-electrolytes (TRILYTE) 420 g solution Take 4,000 mLs by mouth as directed. 4000 mL 0  ? ?No current facility-administered medications for this visit.  ? ? ?Allergies as of 02/23/2022 - Review Complete 02/23/2022  ?Allergen Reaction Noted  ? Morphine Hives and Rash 07/13/2013  ? Other Rash 06/20/2018  ? ? ?Family History  ?Problem Relation Age of Onset  ? Diabetes Maternal Grandmother   ? Colon cancer Neg Hx   ? Colon polyps Neg Hx   ? Inflammatory bowel disease Neg Hx   ? ? ?Social History  ? ?Socioeconomic History  ? Marital status: Single  ?  Spouse name: Not on file  ? Number of children: 0  ? Years of education: BSW  ? Highest education level: Bachelor's degree (e.g., BA, AB, BS)  ?Occupational History  ? Occupation: Ship broker  ?Tobacco Use  ? Smoking status: Never  ? Smokeless tobacco: Never  ?Vaping Use  ? Vaping Use: Never used  ?Substance and Sexual Activity  ? Alcohol use: Not Currently  ?  Comment: RARE  ? Drug use: No  ? Sexual activity: Not Currently  ?Other Topics Concern  ? Not on file  ?Social History Narrative  ? Graduated from [employer]. Straight A student.   ? Now at [employer] in Masters of Gerontology program (as of Oct 2018)   ?  Mother: Arlyss Repress. No  siblings. Mother smokes.  ? Lives at home w/ her mother  ? Right-handed  ? Caffeine: denies  ? ?Social Determinants of Health  ? ?Financial Resource Strain: Not on file  ?Food Insecurity: Not on file  ?Transportation Needs: Not on file  ?Physical Activity: Not on file  ?Stress: Not on file  ?Social Connections: Not on file  ?Intimate Partner Violence: Not on file  ? ? ? ?Review of Systems  ? ?Gen: Denies any fever, chills, fatigue, weight loss, lack of appetite.  ?CV: Denies chest pain, heart palpitations, peripheral edema, syncope.  ?Resp: Denies shortness of breath at rest or with exertion. Denies wheezing or cough.  ?GI: see HPI ?GU : Denies urinary burning, urinary frequency, urinary hesitancy ?MS: Denies joint pain, muscle weakness, cramps, or limitation of movement.  ?Derm: Denies rash, itching, dry skin ?Psych: Denies depression, anxiety, memory loss, and confusion ?Heme: Denies bruising, bleeding, and enlarged lymph nodes. ? ? ?Physical  Exam  ? ?BP 108/72   Pulse 88   Temp (!) 97.1 ?F (36.2 ?C)   Ht 5' 4"  (1.626 m)   Wt 108 lb (49 kg)   BMI 18.54 kg/m?  ?General:   Alert and oriented. Pleasant and cooperative. Well-nourished and well-developed.  ?Head:  Normocephalic and atraumatic. ?Eyes:  Without icterus ?Abdomen:  +BS, soft, non-tender and non-distended. No HSM noted. No guarding or rebound. No masses appreciated.  ?Rectal:  Deferred  ?Msk:  Symmetrical without gross deformities. Normal posture. ?Extremities:  Without edema. ?Neurologic:  Alert and  oriented x4;  grossly normal neurologically. ?Skin:  Intact without significant lesions or rashes. ?Psych:  Alert and cooperative. Normal mood and affect. ? ? ?Assessment  ? ?Kendra Franklin is a 31 y.o. female presenting today in follow-up with a history of ileocolonic Crohn's disease diagnosed in 2011 at age 89, perianal fistula, and osteoporosis. Returning today in routine follow-up. ? ?Ileocolonic Crohn's: at time of diagnosis, she had multiple  small bowel ulcers on capsule study. Colonoscopy with small ulcer at base of cecum 2011. CT enterography in 2020 without active disease. Last EGD and colonoscopy January 2019 with mild gastritis benign duodenal

## 2022-02-23 NOTE — Telephone Encounter (Signed)
Called pt, TCS w/Propofol ASA 2 w/Dr. Abbey Chatters scheduled for 03/25/22 at 8:30am. Advised pt to go to [facility] lab 03/23/22 for pregnancy test. Rx for prep sent to pharmacy. Orders entered. Lab letter and procedure instructions mailed. ?

## 2022-03-23 ENCOUNTER — Telehealth: Payer: Self-pay

## 2022-03-23 NOTE — Telephone Encounter (Signed)
Endo scheduler called office, pt cancelled TCS for 03/25/22 and doesn't want to reschedule. ?

## 2022-03-25 ENCOUNTER — Encounter (HOSPITAL_COMMUNITY): Admission: RE | Payer: Self-pay | Source: Home / Self Care

## 2022-03-25 ENCOUNTER — Ambulatory Visit (HOSPITAL_COMMUNITY): Admission: RE | Admit: 2022-03-25 | Payer: Medicare Other | Source: Home / Self Care

## 2022-03-25 SURGERY — COLONOSCOPY WITH PROPOFOL
Anesthesia: Monitor Anesthesia Care

## 2022-06-06 ENCOUNTER — Ambulatory Visit: Payer: Medicare Other | Admitting: Urology

## 2022-06-08 ENCOUNTER — Ambulatory Visit: Payer: Medicare Other | Admitting: Urology

## 2022-06-16 ENCOUNTER — Ambulatory Visit: Payer: Medicare Other | Admitting: Urology

## 2022-06-16 NOTE — Progress Notes (Deleted)
Assessment: 1. Kidney stones     Plan: I reviewed the patient's recent imaging results including her KUB and ultrasound findings.  Results discussed with the patient. Given her history of recurrent nephrolithiasis, recommend further evaluation with a 24-hour urine. I will contact her with results when available. Continue stone prevention  Return to office in 6 months  Chief Complaint:  No chief complaint on file.   History of Present Illness:  Kendra Franklin is a 31 y.o. year old female who is seen for further evaluation of left ureteral calculus.  She presented with a 2-week history of left flank pain with some associated nausea but no vomiting.  No fevers or chills.  She has a prior history of kidney stones.  She underwent ureteroscopic stone manipulation in [place] approximately 6 years ago.  She was evaluated with a KUB in [place] on 10/11/2021 which showed a 6 mm calcific density over the left flank area suspicious for a possible proximal left ureteral stone, and a large amount of stool throughout the colon.  CT imaging demonstrated a 6 mm calculus at the left UPJ. She underwent left ESL on 10/19/2021. She did well following the procedure, passing a number of stone fragments the following day.   KUB from 10/26/2021 did not show any obvious calcifications. Renal ultrasound from 11/02/2021 showed no evidence of hydronephrosis, a 2 cm left renal cyst. Stone analysis: 50% calcium oxalate monohydrate, 50% calcium oxalate dihydrate. Renal ultrasound from 11/02/2021 showed small right intrarenal stones, no evidence of obstruction or renal mass.  At her visit in January 2023, she was not having any flank pain or urinary symptoms.  She is interested in further evaluation of her recurrent stone disease. 24 hour urine from 1/23 showed a low urine volume of 1 L/day.   Portions of the above documentation were copied from a prior visit for review purposes only.   Past Medical History:   Past Medical History:  Diagnosis Date   Allergy    medicine   Bloody diarrhea 03/11/2015   Crohn's    DX   DEC 2011--- ASCA 100 pANCA NEG-APR 2012 6-TGN 233(LLN 230)  6-MMPN 1173 ON 75 MG IMURAN   Crohn's disease ([place]) 2011   Crohn's disease, small and large intestine ([place])    History of anal fissures    History of anal lesion    2009   NON--HEALING   History of kidney stones    History of small intestine ulcer    2011   Kidney stones 10/15/2021   Osteoporosis     Past Surgical History:  Past Surgical History:  Procedure Laterality Date   COLONOSCOPY  DEC 2011   ILEO-COLONIC ULCERS   COLONOSCOPY N/A 03/16/2015   SLF: 1/ The examined terminal ileum appeared to be normal 2. The left colon is redundatnt 3. Rectal bleeding due to small internal hemorrhoids   COLONOSCOPY N/A 12/26/2017   Procedure: COLONOSCOPY;  Surgeon: Danie Binder, MD;  Examined ileum was normal, significant looping of the colon, diffuse mild inflammation in the rectum consistent with Crohn's disease s/p biopsy and random colon biopsies.  Pathology was benign.   CYSTOSCOPY WITH RETROGRADE PYELOGRAM, URETEROSCOPY AND STENT PLACEMENT Left 02/27/2014   Procedure: CYSTOSCOPY WITH RETROGRADE PYELOGRAM, URETEROSCOPY/ STENT PLACEMENT;  Surgeon: Alexis Frock, MD;  Location: [facility];  Service: Urology;  Laterality: Left;   ESOPHAGOGASTRODUODENOSCOPY N/A 03/16/2015   SLF: 1. epigastric pain most likely due to gastritis/pyschosocial stressors, less likely GERD. 2. Mild non-erosive  gastritis.    ESOPHAGOGASTRODUODENOSCOPY N/A 12/26/2017   Procedure: ESOPHAGOGASTRODUODENOSCOPY (EGD);  Surgeon: Danie Binder, MD;  Normal examined esophagus, mild gastritis s/p biopsy, normal examined duodenum.  Gastric biopsy with mild chronic gastritis, duodenal biopsy benign.   EXTRACORPOREAL SHOCK WAVE LITHOTRIPSY Left 10/19/2021   Procedure: EXTRACORPOREAL SHOCK WAVE LITHOTRIPSY (ESWL);  Surgeon: Primus Bravo., MD;   [facility]: [facility];  Service: Urology;  Laterality: Left;   FLEXIBLE SIGMOIDOSCOPY N/A 12/11/2013   Procedure: FLEXIBLE SIGMOIDOSCOPY;  Surgeon: Danie Binder, MD;  [facility];  Service: Endoscopy;  Laterality: N/A;  1:45 PM   GIVENS CAPSULE STUDY  DEC 2011   SB ULCERS   GIVENS CAPSULE STUDY N/A 01/08/2018   Procedure: GIVENS CAPSULE STUDY;  Surgeon: Danie Binder, MD;  Occasional erosion in the gastric mucosa, small bowel with no ulcers, masses, or AVMs.   HOLMIUM LASER APPLICATION Left 0/07/2956   Procedure: HOLMIUM LASER APPLICATION;  Surgeon: Alexis Frock, MD;  [facility];  Service: Urology;  Laterality: Left;   INCISION AND DRAINAGE PERIRECTAL ABSCESS  2009  &  2010    Allergies:  Allergies  Allergen Reactions   Morphine Hives and Rash   Other Rash    Coban causes bruising and rash    Family History:  Family History  Problem Relation Age of Onset   Diabetes Maternal Grandmother    Colon cancer Neg Hx    Colon polyps Neg Hx    Inflammatory bowel disease Neg Hx     Social History:  Social History   Tobacco Use   Smoking status: Never   Smokeless tobacco: Never  Vaping Use   Vaping Use: Never used  Substance Use Topics   Alcohol use: Not Currently    Comment: RARE   Drug use: No    ROS: Constitutional:  Negative for fever, chills, weight loss CV: Negative for chest pain, previous MI, hypertension Respiratory:  Negative for shortness of breath, wheezing, sleep apnea, frequent cough GI:  Negative for nausea, vomiting, bloody stool, GERD  Physical exam: There were no vitals taken for this visit. GENERAL APPEARANCE:  Well appearing, well developed, well nourished, NAD HEENT:  Atraumatic, normocephalic, oropharynx clear NECK:  Supple without lymphadenopathy or thyromegaly ABDOMEN:  Soft, non-tender, no masses EXTREMITIES:  Moves all extremities well, without clubbing, cyanosis, or edema NEUROLOGIC:  Alert and oriented x 3,  normal gait, CN II-XII grossly intact MENTAL STATUS:  appropriate BACK:  Non-tender to palpation, No CVAT SKIN:  Warm, dry, and intact   Results: U/A dipstick

## 2023-05-12 ENCOUNTER — Other Ambulatory Visit: Payer: Self-pay | Admitting: "Endocrinology

## 2023-05-12 ENCOUNTER — Telehealth: Payer: Self-pay | Admitting: "Endocrinology

## 2023-05-12 DIAGNOSIS — T380X5A Adverse effect of glucocorticoids and synthetic analogues, initial encounter: Secondary | ICD-10-CM

## 2023-05-12 NOTE — Telephone Encounter (Signed)
Pt stated that she is experiencing excruciating pain and its radiating throughout her legs and her legs are swellings, she can not stand for extended periods of time.  Has not been seen since Sept of 2021, made her an appointment for Aug 30, 20214.  Pt wants to know what she can do in the time up until then, or do you want me to overbook?

## 2023-05-12 NOTE — Telephone Encounter (Signed)
Can you put in a new lab order for her appointment in August

## 2023-05-12 NOTE — Telephone Encounter (Signed)
Called pt and let her know Dr. Fransico Him stated to call PCP for acute concern and f/u here in August with Labs.

## 2023-07-28 ENCOUNTER — Ambulatory Visit: Payer: Medicare Other | Admitting: "Endocrinology

## 2023-11-07 ENCOUNTER — Other Ambulatory Visit: Payer: Self-pay | Admitting: "Endocrinology

## 2023-11-07 ENCOUNTER — Telehealth: Payer: Self-pay | Admitting: "Endocrinology

## 2023-11-07 DIAGNOSIS — M818 Other osteoporosis without current pathological fracture: Secondary | ICD-10-CM

## 2023-11-07 NOTE — Telephone Encounter (Signed)
Pt is on schedule for 12/23, what labs does she need prior?

## 2023-11-07 NOTE — Telephone Encounter (Signed)
Pt cancelled appts

## 2023-11-20 ENCOUNTER — Ambulatory Visit: Payer: Medicare Other | Admitting: "Endocrinology

## 2024-04-22 ENCOUNTER — Ambulatory Visit
Admission: EM | Admit: 2024-04-22 | Discharge: 2024-04-22 | Disposition: A | Discharge status: Home / Self Care | Payer: Medicare (Managed Care) | Attending: Family Medicine | Admitting: Family Medicine

## 2024-04-22 DIAGNOSIS — S90569A Insect bite (nonvenomous), unspecified ankle, initial encounter: Secondary | ICD-10-CM | POA: Diagnosis not present

## 2024-04-22 DIAGNOSIS — W57XXXA Bitten or stung by nonvenomous insect and other nonvenomous arthropods, initial encounter: Secondary | ICD-10-CM

## 2024-04-22 DIAGNOSIS — R21 Rash and other nonspecific skin eruption: Secondary | ICD-10-CM

## 2024-04-22 MED ORDER — HYDROCORTISONE 0.5 % EX OINT: 1.0000 | TOPICAL_OINTMENT | Freq: Two times a day (BID) | CUTANEOUS | 0 refills | Status: AC | PRN

## 2024-04-22 MED ORDER — TRIAMCINOLONE ACETONIDE 0.1 % EX CREA: 1.0000 | TOPICAL_CREAM | Freq: Two times a day (BID) | CUTANEOUS | 0 refills | Status: AC | PRN

## 2024-04-22 NOTE — ED Provider Notes (Signed)
 [facility]    CSN: [id number] Arrival date & time: 04/22/24  1429      History   Chief Complaint Chief Complaint  Patient presents with   Insect Bite    HPI Kendra Franklin is a 33 y.o. female.   Patient presenting today with about a week of some insect bites to the right thigh and left ankle that have been itchy.  Denies redness, swelling, fevers, chills, body aches.  So far not trying anything over-the-counter for symptoms.  Also having some small bumps to the top lip diffusely.  She states she has been having these intermittently for quite some time now and cannot identify a trigger apart from possibly stress.  Denies itching, pain, drainage, difficulty breathing or swallowing, throat itching.    Past Medical History:  Diagnosis Date   Allergy    medicine   Bloody diarrhea 03/11/2015   Crohn's    DX   DEC 2011--- ASCA 100 pANCA NEG-APR 2012 6-TGN 233(LLN 230)  6-MMPN 1173 ON 75 MG IMURAN    Crohn's disease (HCC) 2011   Crohn's disease, small and large intestine (HCC)    History of anal fissures    History of anal lesion    2009   NON--HEALING   History of kidney stones    History of small intestine ulcer    2011   Kidney stones 10/15/2021   Osteoporosis     Patient Active Problem List   Diagnosis Date Noted   Crohn's disease (HCC) 02/23/2022   Ureteral calculus, left 10/26/2021   Kidney stones 10/15/2021   Vitamin D  deficiency 08/12/2020   Suprapubic pain, acute 03/27/2019   Crohn's disease of intestine (HCC) 01/30/2019   Loss of weight    Nausea with vomiting 12/06/2017   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 The left colon is redundatnt 3. Rectal bleeding due to small internal hemorrhoids   COLONOSCOPY N/A 12/26/2017   Procedure: COLONOSCOPY;  Surgeon: Alyce Jubilee, MD;  Examined ileum was normal, significant looping of the colon, diffuse mild inflammation in the rectum consistent with Crohn's disease s/p biopsy and random colon biopsies.  Pathology was benign.   CYSTOSCOPY WITH RETROGRADE PYELOGRAM, URETEROSCOPY AND STENT PLACEMENT Left 02/27/2014   Procedure: CYSTOSCOPY WITH RETROGRADE PYELOGRAM, URETEROSCOPY/ STENT PLACEMENT;  Surgeon: Osborn Blaze, MD;  Location: [facility];  Service: Urology;  Laterality: Left;   ESOPHAGOGASTRODUODENOSCOPY N/A 03/16/2015   SLF: 1. epigastric pain most likely due to gastritis/pyschosocial stressors, less likely GERD. 2. Mild non-erosive gastritis.    ESOPHAGOGASTRODUODENOSCOPY N/A 12/26/2017   Procedure: ESOPHAGOGASTRODUODENOSCOPY (EGD);  Surgeon: Alyce Jubilee, MD;  Normal examined esophagus, mild gastritis s/p biopsy, normal examined duodenum.  Gastric biopsy with mild chronic gastritis, duodenal biopsy benign.   EXTRACORPOREAL SHOCK WAVE LITHOTRIPSY Left 10/19/2021   Procedure: EXTRACORPOREAL SHOCK WAVE LITHOTRIPSY (ESWL);  Surgeon: Mellie Sprinkle., MD;  Location: [facility];  Service: Urology;  Laterality: Left;   FLEXIBLE SIGMOIDOSCOPY N/A 12/11/2013   Procedure: FLEXIBLE SIGMOIDOSCOPY;  Surgeon: Alyce Jubilee, MD;  Location: [facility];  Service: Endoscopy;  Laterality: N/A;  1:45 PM   GIVENS CAPSULE STUDY  DEC 2011   SB ULCERS   GIVENS CAPSULE STUDY N/A 01/08/2018   Procedure: GIVENS CAPSULE STUDY;  Surgeon: Alyce Jubilee, MD;  Occasional erosion in the gastric mucosa, small bowel with no ulcers, masses, or AVMs.   HOLMIUM LASER APPLICATION Left 02/27/2014   Procedure: HOLMIUM LASER APPLICATION;  Surgeon: Osborn Blaze, MD;  Location: [facility];  Service:  Urology;  Laterality: Left;   INCISION AND DRAINAGE PERIRECTAL ABSCESS  2009  &  2010    OB History   No obstetric history on file.      Home Medications    Prior to Admission medications   Medication Sig Start Date End Date Taking? Authorizing Provider  hydrocortisone ointment 0.5 % Apply 1 Application topically 2 (two) times daily as needed for itching. 04/22/24  Yes Corbin Dess, PA-C  triamcinolone cream (KENALOG) 0.1 % Apply 1 Application topically 2 (two) times daily as needed. Apply to insect bites on legs as needed 04/22/24  Yes Corbin Dess, PA-C  medroxyPROGESTERone (DEPO-PROVERA) 150 MG/ML injection Inject 150 mg into the muscle every 3 (three) months. Due in October 2022    [provider]  polyethylene glycol-electrolytes (TRILYTE ) 420 g solution Take 4,000 mLs by mouth as directed. 02/23/22   Vinetta Greening, DO    Family History Family History  Problem Relation Age of Onset   Diabetes Maternal Grandmother    Colon cancer Neg Hx    Colon polyps Neg Hx    Inflammatory bowel disease Neg Hx     Social History Social History   Tobacco Use   Smoking status: Never   Smokeless tobacco: Never  Vaping Use   Vaping status: Never Used  Substance Use Topics   Alcohol  use: Not Currently    Comment: RARE   Drug use: No     Allergies   Morphine and Other   Review of Systems Review of Systems Per HPI  Physical Exam Triage Vital Signs ED Triage Vitals  Encounter Vitals Group     BP 04/22/24 1441 118/78     Systolic BP Percentile --      Diastolic BP Percentile --      Pulse Rate 04/22/24 1441 (!) 106     Resp 04/22/24 1441 16     Temp 04/22/24 1441 98.2 F (36.8 C)     Temp Source 04/22/24 1441 Oral     SpO2 04/22/24 1441 98 %     Weight --      Height --      Head Circumference --      Peak Flow --      Pain Score 04/22/24 1444 0     Pain Loc --      Pain Education --      Exclude from Growth Chart --    No data  found.  Updated Vital Signs BP 118/78 (BP Location: Right Arm)   Pulse (!) 106   Temp 98.2 F (36.8 C) (Oral)   Resp 16   LMP 04/09/2024 (Approximate)   SpO2 98%   Visual Acuity Right Eye Distance:   Left Eye Distance:   Bilateral Distance:    Right Eye Near:   Left Eye Near:    Bilateral Near:     Physical Exam Vitals and nursing note reviewed.  Constitutional:      Appearance: Normal appearance. She is not ill-appearing.  HENT:  Head: Atraumatic.  Eyes:     Extraocular Movements: Extraocular movements intact.     Conjunctiva/sclera: Conjunctivae normal.  Cardiovascular:     Rate and Rhythm: Normal rate.  Pulmonary:     Effort: Pulmonary effort is normal.  Musculoskeletal:        General: Normal range of motion.     Cervical back: Normal range of motion and neck supple.  Skin:    General: Skin is warm.     Findings: Rash present.     Comments: Pinpoint flesh-colored papular rash to upper lip.  Several scabbed lesions to right thigh and left ankle  Neurological:     Mental Status: She is alert and oriented to person, place, and time.  Psychiatric:        Mood and Affect: Mood normal.        Thought Content: Thought content normal.        Judgment: Judgment normal.      UC Treatments / Results  Labs (all labs ordered are listed, but only abnormal results are displayed) Labs Reviewed - No data to display  EKG   Radiology No results found.  Procedures Procedures (including critical care time)  Medications Ordered in UC Medications - No data to display  Initial Impression / Assessment and Plan / UC Course  I have reviewed the triage vital signs and the nursing notes.  Pertinent labs & imaging results that were available during my care of the patient were reviewed by me and considered in my medical decision making (see chart for details).     Treat with triamcinolone cream for insect bites to bilateral legs.  Very small amount of hydrocortisone  ointment mixed with Vaseline or Aquaphor to the lips as needed for bumps.  Suspect irritant dermatitis.  Final Clinical Impressions(s) / UC Diagnoses   Final diagnoses:  Rash  Insect bite of ankle, unspecified laterality, initial encounter     Discharge Instructions      You may mix a very small amount of the hydrocortisone ointment with some Aquaphor or Vaseline and apply to the lips as needed.  The triamcinolone is for the insect bites as needed  ED Prescriptions     Medication Sig Dispense Auth. Provider   triamcinolone cream (KENALOG) 0.1 % Apply 1 Application topically 2 (two) times daily as needed. Apply to insect bites on legs as needed 60 g Corbin Dess, PA-C   hydrocortisone ointment 0.5 % Apply 1 Application topically 2 (two) times daily as needed for itching. 30 g Corbin Dess, [place]      PDMP not reviewed this encounter.   Corbin Dess, [place] 04/22/24 1825

## 2024-04-22 NOTE — Discharge Instructions (Signed)
 You may mix a very small amount of the hydrocortisone ointment with some Aquaphor or Vaseline and apply to the lips as needed.  The triamcinolone is for the insect bites as needed

## 2024-04-22 NOTE — ED Triage Notes (Signed)
 Possible insect bite to right thigh and left ankle that is itchy x 1 week. Pt states it has clear/brown drainage that comes out. Pt states she is also having small bumps to the top of her lip.
